# Patient Record
Sex: Male | Born: 1950 | Race: White | Hispanic: No | Smoking: Former smoker
Health system: Southern US, Community
[De-identification: ages and names within clinical notes are randomized; demographics above are authoritative.]

## PROBLEM LIST (undated history)

## (undated) HISTORY — PX: OTHER SURGICAL HISTORY: SHX169

---

## 2004-08-12 ENCOUNTER — Ambulatory Visit: Payer: Self-pay | Admitting: Family Medicine

## 2006-05-05 ENCOUNTER — Ambulatory Visit: Payer: Self-pay | Admitting: Chiropractic Medicine

## 2007-09-21 ENCOUNTER — Ambulatory Visit: Payer: Self-pay | Admitting: Chiropractic Medicine

## 2009-06-11 ENCOUNTER — Ambulatory Visit: Payer: Self-pay | Admitting: Gastroenterology

## 2010-03-29 ENCOUNTER — Emergency Department: Payer: Self-pay | Admitting: Emergency Medicine

## 2010-08-05 ENCOUNTER — Emergency Department: Payer: Self-pay | Admitting: Emergency Medicine

## 2014-03-20 ENCOUNTER — Ambulatory Visit: Payer: Self-pay | Admitting: Gastroenterology

## 2016-02-04 ENCOUNTER — Other Ambulatory Visit: Payer: Self-pay | Admitting: Internal Medicine

## 2016-02-04 DIAGNOSIS — R296 Repeated falls: Secondary | ICD-10-CM

## 2016-02-11 ENCOUNTER — Ambulatory Visit
Admission: RE | Admit: 2016-02-11 | Discharge: 2016-02-11 | Disposition: A | Discharge status: Home / Self Care | Payer: Medicare Other | Source: Ambulatory Visit | Attending: Internal Medicine | Admitting: Internal Medicine

## 2016-02-11 DIAGNOSIS — I6523 Occlusion and stenosis of bilateral carotid arteries: Secondary | ICD-10-CM | POA: Insufficient documentation

## 2016-02-11 DIAGNOSIS — Z9181 History of falling: Secondary | ICD-10-CM | POA: Diagnosis present

## 2016-02-11 DIAGNOSIS — R296 Repeated falls: Secondary | ICD-10-CM

## 2016-02-17 ENCOUNTER — Other Ambulatory Visit: Payer: Self-pay | Admitting: Internal Medicine

## 2016-02-17 DIAGNOSIS — R55 Syncope and collapse: Secondary | ICD-10-CM

## 2016-02-19 ENCOUNTER — Ambulatory Visit
Admission: RE | Admit: 2016-02-19 | Discharge: 2016-02-19 | Disposition: A | Discharge status: Home / Self Care | Payer: Medicare Other | Source: Ambulatory Visit | Attending: Internal Medicine | Admitting: Internal Medicine

## 2016-02-19 DIAGNOSIS — R55 Syncope and collapse: Secondary | ICD-10-CM | POA: Diagnosis not present

## 2016-02-19 MED ORDER — IOPAMIDOL (ISOVUE-370) INJECTION 76%
75.0000 mL | Freq: Once | INTRAVENOUS | Status: AC | PRN
Start: 2016-02-19 — End: 2016-02-19
  Administered 2016-02-19: 75 mL via INTRAVENOUS

## 2016-03-01 ENCOUNTER — Other Ambulatory Visit: Payer: Self-pay | Admitting: Family Medicine

## 2016-03-08 DIAGNOSIS — W19XXXA Unspecified fall, initial encounter: Secondary | ICD-10-CM | POA: Insufficient documentation

## 2016-03-08 DIAGNOSIS — M21372 Foot drop, left foot: Secondary | ICD-10-CM | POA: Insufficient documentation

## 2016-04-19 ENCOUNTER — Ambulatory Visit: Payer: Medicare Other | Admitting: Physical Therapy

## 2016-04-25 ENCOUNTER — Encounter: Payer: Medicare Other | Admitting: Physical Therapy

## 2016-04-28 ENCOUNTER — Ambulatory Visit: Payer: Medicare Other | Attending: Neurology | Admitting: Physical Therapy

## 2016-04-28 ENCOUNTER — Encounter: Payer: Self-pay | Admitting: Physical Therapy

## 2016-04-28 DIAGNOSIS — M6281 Muscle weakness (generalized): Secondary | ICD-10-CM

## 2016-04-28 DIAGNOSIS — R2681 Unsteadiness on feet: Secondary | ICD-10-CM | POA: Diagnosis present

## 2016-04-28 NOTE — Therapy (Signed)
[place] [facility] MAIN [facility] [address] [place], [place], [address] Phone: [phone]   Fax:  [phone]  Physical Therapy Evaluation  Patient Details  Name: Joseph Owen MRN: [id number] Date of Birth: Jan 31, 1951 Referring Provider: Morene CrockerPOTTER, ZACHARY E  Encounter Date: 04/28/2016    No past medical history on file.  No past surgical history on file.  There were no vitals filed for this visit.       Subjective Assessment - 04/28/16 1109    Subjective Patient is having unsteady gait and falls. He also has been sick with a virus for 2-3 weeks and also is having new onset of left hip pain and shooting pain to left knee.    Pertinent History lumbar back surgery x 2, herniated disk cervical spine, right shouler pain/rotator cuff tear/ 3 torn ligaments, left rotator cuff surgery   How long can you stand comfortably? 10  mintues   How long can you walk comfortably? 15 minutes   Diagnostic tests MRI to back 2 years ago, MRI to right shoulder   Patient Stated Goals to walk without pain and become more active   Currently in Pain? Yes   Pain Score 3    Pain Location Hip   Pain Orientation Left   Pain Descriptors / Indicators Aching   Pain Type Acute pain   Pain Onset In the past 7 days   Multiple Pain Sites Yes  knee pain            [facility] PT Assessment - 04/28/16 0001    Assessment   Medical Diagnosis frequent falls   Referring Provider Theora MasterOTTER, ZACHARY E   Onset Date/Surgical Date 04/18/16   Hand Dominance Right   Prior Therapy --  no   Precautions   Precautions Fall   Restrictions   Weight Bearing Restrictions No   Balance Screen   Has the patient fallen in the past 6 months Yes   How many times? 7   Has the patient had a decrease in activity level because of a fear of falling?  Yes   Is the patient reluctant to leave their home because of a fear of falling?  No   Home Nurse, mental healthnvironment   Living Environment Private residence    Living Arrangements Alone   Available Help at Discharge Friend(s)   Type of Home House   Home Access Level entry   Home Layout One level   Home Equipment [place] - single point   Prior Function   Level of Independence Independent   Vocation Retired   Leisure travel , [place], Geophysical data processorhike   Cognition   Overall Cognitive Status Within Functional Limits for tasks assessed   Attention Focused         PAIN: Left hip pain and left knee pain 5/10 that is intermittent  POSTURE: WNL  PROM/AROM: left hip flex 0-90 deg and pain greater than 90 deg Left hip IR 0 with pain  STRENGTH:  Graded on a 0-5 scale Muscle Group Left Right  Shoulder flex    Shoulder Abd    Shoulder Ext    Shoulder IR/ER    Elbow    Wrist/hand    Hip Flex 4/5 5/5  Hip Abd 5/5 5/5  Hip Add 5/5 5/5  Hip Ext    Hip IR/ER 3/5 5/5  Knee Flex 5/5 5/5  Knee Ext 5/5 5/5  Ankle DF 3/5 5/5  Ankle PF 4/5 5/5   SENSATION: decreased sensation L foot  light touch    SPECIAL TESTS: + scour LLE  + FABER  LLE   FUNCTIONAL MOBILITY: independent   BALANCE:  Patient is able to single leg stand with supervision and tandem stand with supervision   GAIT:  Patient ambulates without AD and LLE in ER with decreased LLE foot clearance during swing phase.   OUTCOME MEASURES: TEST Outcome Interpretation  5 times sit<>stand 13. 57sec >60 yo, >15 sec indicates increased risk for falls  10 meter walk test  . 94                m/s <1.0 m/s indicates increased risk for falls; limited community ambulator  Timed up and Go        9.89         sec <14 sec indicates increased risk for falls  6 minute walk test    900            Feet 1000 feet is community ambulator                                     PT Long Term Goals - 04/28/16 1210    PT LONG TERM GOAL #1   Title Patient will be independent in home exercise program to improve strength/mobility for better functional independence with ADLs.   Time 8   Period  Weeks   Status New   PT LONG TERM GOAL #2   Title Patient (< 74 years old) will complete five times sit to stand test in < 10 seconds indicating an increased LE strength and improved balance.   Time 8   Period Weeks   Status New   PT LONG TERM GOAL #3   Title Patient will increase six minute walk test distance to >1000 for progression to community ambulator and improve gait ability   Time 8   Period Weeks   Status New   PT LONG TERM GOAL #4   Title  Patient will increase 10 meter walk test to >1.49m/s as to improve gait speed for better community ambulation and to reduce fall risk   Time 8   Period Weeks   Status New   PT LONG TERM GOAL #5   Title Patient will report a worst pain of 3/10 on VAS in   left hip          to improve tolerance with ADLs and reduced symptoms with activities   Time 8   Period Weeks   Status New               Plan - 04/28/16 1229    Clinical Impression Statement Patient has recent multiple falls and onset of L hip and L knee pain that is limiting his left standing and walking tolerance. Patient has weakness in LLE hip and foot and unsteady gait.    Rehab Potential Good   PT Frequency 2x / week   PT Duration 8 weeks   PT Treatment/Interventions Gait training;Therapeutic activities;Therapeutic exercise;Balance training;Manual techniques   PT Next Visit Plan manual therapy left hip   Consulted and Agree with Plan of Care Patient      Patient will benefit from skilled therapeutic intervention in order to improve the following deficits and impairments:  Difficulty walking, Decreased balance, Decreased strength, Pain, Impaired sensation  Visit Diagnosis: Unsteadiness on feet - Plan: PT plan of care cert/re-cert  Muscle weakness (generalized) - Plan:  PT plan of care cert/re-cert      G-Codes - 04/28/16 1209    Functional Assessment Tool Used 5 x sit to stand, 10 MW, TUG, 6 MW   Functional Limitation Mobility: Walking and moving around    Mobility: Walking and Moving Around Current Status [phone]([id number]) At least 20 percent but less than 40 percent impaired, limited or restricted   Mobility: Walking and Moving Around Goal Status [phone]([id number]) At least 1 percent but less than 20 percent impaired, limited or restricted       Problem List There are no active problems to display for this patient. Ezekiel InaKristine S [place], PT, DPT  [place], Bark[place] BrunsKristine S 04/28/2016, 12:32 PM  [place] [facility] MAIN [facility] [address] [place], [place], [address] Phone: [phone]   Fax:  [phone]  Name: Joseph Owen MRN: [id number] Date of Birth: [birth year]

## 2016-05-03 ENCOUNTER — Ambulatory Visit: Payer: Medicare Other | Admitting: Physical Therapy

## 2016-05-03 DIAGNOSIS — M6281 Muscle weakness (generalized): Secondary | ICD-10-CM

## 2016-05-03 DIAGNOSIS — R2681 Unsteadiness on feet: Secondary | ICD-10-CM | POA: Diagnosis not present

## 2016-05-03 NOTE — Therapy (Signed)
[place] [facility] MAIN [facility] SERVICES [address] [place], [place], [address] Phone: [phone]   Fax:  [phone]  Physical Therapy Treatment  Patient Details  Name: Joseph Owen MRN: [id number] Date of Birth: [birth year] Referring Provider: Morene Crocker  Encounter Date: 05/03/2016    No past medical history on file.  No past surgical history on file.  There were no vitals filed for this visit.      Subjective Assessment - 05/03/16 1521    Subjective Patient is having B knee pain and back pain. Nothing is painful today.    Pertinent History lumbar back surgery x 2, herniated disk cervical spine, right shouler pain/rotator cuff tear/ 3 torn ligaments, left rotator cuff surgery   How long can you stand comfortably? 10  mintues   How long can you walk comfortably? 15 minutes   Diagnostic tests MRI to back 2 years ago, MRI to right shoulder   Patient Stated Goals to walk without pain and become more active   Pain Onset In the past 7 days      Therapeutic exercise: standing hip abd/ flex/ ext  with YTB x 20  Side stepping on blue balance beam foam x 10 x 3 Stepping over 1/2 foam left and right x 10  step ups from floor to 6 inch stool x 20 bilateral sit to stand x 10 marching in parallel bars x 20  tapping to 6 inch stool x 20 TM x . 7 miles / hour x 7 minutes TM side stepping left and right x 2 minutes each side x . 4 miles/hour  Min cueing needed to appropriately perform  tasks with leg, hand, and head position. Decreased coordination demonstrated requiring consistent verbal cueing to correct form. Patient continues to demonstrate some in coordination of movement with select exercises such as stepping backwards. Patient responds well to verbal and tactile cues to correct form and technique.  CGA to SBA for safety with activities.                             PT Education - 05/03/16 1521    Education provided  Yes   Education Details HEP   Person(Owen) Educated Patient   Methods Explanation   Comprehension Verbalized understanding             PT Long Term Goals - 04/28/16 1210    PT LONG TERM GOAL #1   Title Patient will be independent in home exercise program to improve strength/mobility for better functional independence with ADLs.   Time 8   Period Weeks   Status New   PT LONG TERM GOAL #2   Title Patient (< 76 years old) will complete five times sit to stand test in < 10 seconds indicating an increased LE strength and improved balance.   Time 8   Period Weeks   Status New   PT LONG TERM GOAL #3   Title Patient will increase six minute walk test distance to >1000 for progression to community ambulator and improve gait ability   Time 8   Period Weeks   Status New   PT LONG TERM GOAL #4   Title  Patient will increase 10 meter walk test to >1.40m/Owen as to improve gait speed for better community ambulation and to reduce fall risk   Time 8   Period Weeks   Status New   PT LONG TERM GOAL #5  Title Patient will report a worst pain of 3/10 on VAS in   left hip          to improve tolerance with ADLs and reduced symptoms with activities   Time 8   Period Weeks   Status New               Plan - 05/03/16 1522    Clinical Impression Statement Patient is not having any pain complaints today and is able to perform beginning LE exercises to reach goals to improve gait.    Rehab Potential Good   PT Frequency 2x / week   PT Duration 8 weeks   PT Treatment/Interventions Gait training;Therapeutic activities;Therapeutic exercise;Balance training;Manual techniques   PT Next Visit Plan manual therapy left hip   Consulted and Agree with Plan of Care Patient      Patient will benefit from skilled therapeutic intervention in order to improve the following deficits and impairments:  Difficulty walking, Decreased balance, Decreased strength, Pain, Impaired sensation  Visit  Diagnosis: Unsteadiness on feet  Muscle weakness (generalized)     Problem List There are no active problems to display for this patient.  Joseph Owen, PT, DPT [place], [place] Owen 05/03/2016, 3:50 PM  [place] [facility] [address] [place], [place], [address] Phone: [phone]   Fax:  [phone]  Name: Joseph Owen MRN: [id number] Date of Birth: Feb 05, 1951

## 2016-05-04 ENCOUNTER — Encounter: Payer: Medicare Other | Admitting: Physical Therapy

## 2016-05-05 ENCOUNTER — Ambulatory Visit: Payer: Medicare Other | Admitting: Physical Therapy

## 2016-05-05 ENCOUNTER — Encounter: Payer: Self-pay | Admitting: Physical Therapy

## 2016-05-05 DIAGNOSIS — R2681 Unsteadiness on feet: Secondary | ICD-10-CM | POA: Diagnosis not present

## 2016-05-05 DIAGNOSIS — M6281 Muscle weakness (generalized): Secondary | ICD-10-CM

## 2016-05-05 NOTE — Therapy (Signed)
[facility] MAIN [facility] SERVICES [address] [place], [place], [address] Phone: [phone]   Fax:  [phone]  Physical Therapy Treatment  Patient Details  Name: Joseph Owen MRN: [id number] Date of Birth: 01/25/1951 Referring Provider: Morene Crocker  Encounter Date: 05/05/2016    History reviewed. No pertinent past medical history.  History reviewed. No pertinent past surgical history.  There were no vitals filed for this visit.      Subjective Assessment - 05/05/16 1547    Subjective Patient is having B knee pain and back pain. Nothing is painful today.    Pertinent History lumbar back surgery x 2, herniated disk cervical spine, right shouler pain/rotator cuff tear/ 3 torn ligaments, left rotator cuff surgery   How long can you stand comfortably? 10  mintues   How long can you walk comfortably? 15 minutes   Diagnostic tests MRI to back 2 years ago, MRI to right shoulder   Patient Stated Goals to walk without pain and become more active   Currently in Pain? Yes   Pain Score 3    Pain Location Hip   Pain Orientation Left   Pain Descriptors / Indicators Aching   Pain Onset In the past 7 days     TM x 1. 0 m/ hour x 5 mins,  side stepping left and right . 4 m/ hour B LE standing hip SLR, abd, ext 2x10 with GTB Side stepping with YTB x 10 feet x 5 Mini squats x 10 x 2  Heel raises 2x10 Leg press 75 lbs x 20 x 2  Cues for proper technique of exercises, slow eccentric contractions to target specific muscles and facility increased muscle building. Therapeutic rest breaks for energy conservation                           PT Education - 05/05/16 1547    Education provided Yes   Education Details HEP   Person(s) Educated Patient   Methods Explanation   Comprehension Verbalized understanding             PT Long Term Goals - 04/28/16 1210    PT LONG TERM GOAL #1   Title Patient will be  independent in home exercise program to improve strength/mobility for better functional independence with ADLs.   Time 8   Period Weeks   Status New   PT LONG TERM GOAL #2   Title Patient (< 58 years old) will complete five times sit to stand test in < 10 seconds indicating an increased LE strength and improved balance.   Time 8   Period Weeks   Status New   PT LONG TERM GOAL #3   Title Patient will increase six minute walk test distance to >1000 for progression to community ambulator and improve gait ability   Time 8   Period Weeks   Status New   PT LONG TERM GOAL #4   Title  Patient will increase 10 meter walk test to >1.41m/s as to improve gait speed for better community ambulation and to reduce fall risk   Time 8   Period Weeks   Status New   PT LONG TERM GOAL #5   Title Patient will report a worst pain of 3/10 on VAS in   left hip          to improve tolerance with ADLs and reduced symptoms with activities   Time 8  Period Weeks   Status New               Plan - 05/05/16 1548    Clinical Impression Statement  Min cueing needed during hip 3-way to maintain upright posture with knees straight. Pt had good performance of therapeutic exercise today.   Rehab Potential Good   PT Frequency 2x / week   PT Duration 8 weeks   PT Treatment/Interventions Gait training;Therapeutic activities;Therapeutic exercise;Balance training;Manual techniques   PT Next Visit Plan manual therapy left hip   Consulted and Agree with Plan of Care Patient      Patient will benefit from skilled therapeutic intervention in order to improve the following deficits and impairments:  Difficulty walking, Decreased balance, Decreased strength, Pain, Impaired sensation  Visit Diagnosis: Unsteadiness on feet  Muscle weakness (generalized)     Problem List There are no active problems to display for this patient.  Ezekiel InaKristine S Jaleen Finch, PT, DPT [place], Bark[place] BrunsKristine S 05/05/2016, 3:49 PM  [facility] [address] [place], [place], [address] Phone: [phone]   Fax:  [phone]  Name: Joseph Owen MRN: [id number] Date of Birth: 1951/05/29

## 2016-05-09 ENCOUNTER — Encounter: Payer: Self-pay | Admitting: Physical Therapy

## 2016-05-09 ENCOUNTER — Ambulatory Visit: Payer: Medicare Other | Admitting: Physical Therapy

## 2016-05-09 DIAGNOSIS — R2681 Unsteadiness on feet: Secondary | ICD-10-CM | POA: Diagnosis not present

## 2016-05-09 DIAGNOSIS — M6281 Muscle weakness (generalized): Secondary | ICD-10-CM

## 2016-05-09 NOTE — Therapy (Addendum)
[place] [facility] MA[facility] [address] [place], [place], [address] Phone: [phone]   Fax:  [phone]  Physical Therapy Treatment  Patient Details  Name: Joseph Owen MRN: [id number] Date of Birth: 10-01-1951 Referring Provider: Morene CrockerPOTTER, ZACHARY E  Encounter Date: 05/09/2016    History reviewed. No pertinent past medical history.  History reviewed. No pertinent past surgical history.  There were no vitals filed for this visit.      Subjective Assessment - 05/09/16 1309    Subjective Patient is feeling very tired and lethargic today.    Pertinent History lumbar back surgery x 2, herniated disk cervical spine, right shouler pain/rotator cuff tear/ 3 torn ligaments, left rotator cuff surgery   How long can you stand comfortably? 10  mintues   How long can you walk comfortably? 15 minutes   Diagnostic tests MRI to back 2 years ago, MRI to right shoulder   Patient Stated Goals to walk without pain and become more active   Currently in Pain? Yes   Pain Score 2    Pain Location Knee   Pain Onset In the past 7 days      Therapeutic exercise and neuromuscular training: 1/2 foam flat side up and balance with head turns left and right feet apart and feet together,  tandem standing on 1/2 foam     side stepping left and right in parallel bars 10 feet x 3 Blue foam feet together and tandem standing with head turns  Tm walking 1. 0 m/hour x 5 mins TM walking side stepping left and right x . 4 miles / hour  Patient needs occasional verbal cueing to improve posture and cueing to correctly perform exercises slowly, holding at end of range to increase motor firing of desired muscle to encourage fatigue.                           PT Education - 05/09/16 1310    Education provided Yes   Education Details HEP   Person(s) Educated Patient   Methods Explanation   Comprehension Verbalized understanding              PT Long Term Goals - 04/28/16 1210    PT LONG TERM GOAL #1   Title Patient will be independent in home exercise program to improve strength/mobility for better functional independence with ADLs.   Time 8   Period Weeks   Status New   PT LONG TERM GOAL #2   Title Patient (< 66 years old) will complete five times sit to stand test in < 10 seconds indicating an increased LE strength and improved balance.   Time 8   Period Weeks   Status New   PT LONG TERM GOAL #3   Title Patient will increase six minute walk test distance to >1000 for progression to community ambulator and improve gait ability   Time 8   Period Weeks   Status New   PT LONG TERM GOAL #4   Title  Patient will increase 10 meter walk test to >1.2463m/s as to improve gait speed for better community ambulation and to reduce fall risk   Time 8   Period Weeks   Status New   PT LONG TERM GOAL #5   Title Patient will report a worst pain of 3/10 on VAS in   left hip          to improve tolerance with  ADLs and reduced symptoms with activities   Time 8   Period Weeks   Status New               Plan - 05/09/16 1310    Clinical Impression Statement Pt requires mod verbal and tactile cues for proper exercise performance     Rehab Potential Good   PT Frequency 2x / week   PT Duration 8 weeks   PT Treatment/Interventions Gait training;Therapeutic activities;Therapeutic exercise;Balance training;Manual techniques   PT Next Visit Plan manual therapy left hip   Consulted and Agree with Plan of Care Patient      Patient will benefit from skilled therapeutic intervention in order to improve the following deficits and impairments:  Difficulty walking, Decreased balance, Decreased strength, Pain, Impaired sensation  Visit Diagnosis: Unsteadiness on feet  Muscle weakness (generalized)     Problem List There are no active problems to display for this patient.  Ezekiel Ina, PT, DPT [place], [place]  S 05/09/2016, 1:12 PM  [place] [facility] MAIN [facility] SERVICES [address] [place], [place], [address] Phone: [phone]   Fax:  [phone]  Name: Joseph Owen MRN: [id number] Date of Birth: 06/24/1951

## 2016-05-11 ENCOUNTER — Encounter: Payer: Self-pay | Admitting: Physical Therapy

## 2016-05-11 ENCOUNTER — Ambulatory Visit: Payer: Medicare Other | Admitting: Physical Therapy

## 2016-05-11 DIAGNOSIS — M6281 Muscle weakness (generalized): Secondary | ICD-10-CM

## 2016-05-11 DIAGNOSIS — R2681 Unsteadiness on feet: Secondary | ICD-10-CM

## 2016-05-11 NOTE — Therapy (Signed)
[facility] MAIN [facility] [address] [place], [place], [address] Phone: [phone]   Fax:  [phone]  Physical Therapy Treatment  Patient Details  Name: Deforest HoylesRobert E Brocks MRN: [id number] Date of Birth: 1951-07-12 Referring Provider: Morene CrockerPOTTER, ZACHARY E  Encounter Date: 05/11/2016    History reviewed. No pertinent past medical history.  History reviewed. No pertinent past surgical history.  There were no vitals filed for this visit.      Subjective Assessment - 05/11/16 1304    Subjective Patient is feeling better over all . His knees are hurting more than his hip was to begin with. Its getting easier to get off the couch.    Pertinent History lumbar back surgery x 2, herniated disk cervical spine, right shouler pain/rotator cuff tear/ 3 torn ligaments, left rotator cuff surgery   How long can you stand comfortably? 10  mintues   How long can you walk comfortably? 15 minutes   Diagnostic tests MRI to back 2 years ago, MRI to right shoulder   Patient Stated Goals to walk without pain and become more active   Currently in Pain? Yes   Pain Score 2    Pain Location Knee   Pain Orientation Right;Left   Pain Descriptors / Indicators Aching   Pain Onset In the past 7 days      Neuromuscular Re-education   Marching in place on blue foam pad x 30 seconds for 2 sets   Tandem standing in // bars  x 1 minute Step ups to blue foam pad x 10 bilaterally for 2 sets bilaterally   Heel raises bilateral feet  Matrix  x 5 reps fwd/bwd/side stepping Feet together on blue foam and holding ball x 1 minute, holding ball and fwd and bwd movement elbow flex/ext x 20, horizontal abd/add with ball and arms extended. Toe taps on AIREX + step 3x10 no Ue Fwd step up onto 6inch step from AIREX no UE Fwd step up onto 4inch step no UE x 10 each side CGA to min A needed cues for wt Patient needs occasional verbal cueing to improve posture and cueing to  correctly perform exercises slowly, holding at end of range to increase motor firing of desired muscle to encourage fatigue.                              PT Education - 05/11/16 1305    Education provided Yes   Education Details HEP   Person(s) Educated Patient   Methods Explanation   Comprehension Verbalized understanding             PT Long Term Goals - 04/28/16 1210    PT LONG TERM GOAL #1   Title Patient will be independent in home exercise program to improve strength/mobility for better functional independence with ADLs.   Time 8   Period Weeks   Status New   PT LONG TERM GOAL #2   Title Patient (< 66 years old) will complete five times sit to stand test in < 10 seconds indicating an increased LE strength and improved balance.   Time 8   Period Weeks   Status New   PT LONG TERM GOAL #3   Title Patient will increase six minute walk test distance to >1000 for progression to community ambulator and improve gait ability   Time 8   Period Weeks   Status New   PT LONG TERM  GOAL #4   Title  Patient will increase 10 meter walk test to >1.67m/s as to improve gait speed for better community ambulation and to reduce fall risk   Time 8   Period Weeks   Status New   PT LONG TERM GOAL #5   Title Patient will report a worst pain of 3/10 on VAS in   left hip          to improve tolerance with ADLs and reduced symptoms with activities   Time 8   Period Weeks   Status New               Plan - 05/11/16 1306    Clinical Impression Statement Patient has LE weakness and his hip pain is improving. He is able to perform beginning strengthening exercises for strength and balance.    Rehab Potential Good   PT Frequency 2x / week   PT Duration 8 weeks   PT Treatment/Interventions Gait training;Therapeutic activities;Therapeutic exercise;Balance training;Manual techniques   PT Next Visit Plan manual therapy left hip   Consulted and Agree with Plan of Care  Patient      Patient will benefit from skilled therapeutic intervention in order to improve the following deficits and impairments:  Difficulty walking, Decreased balance, Decreased strength, Pain, Impaired sensation  Visit Diagnosis: Unsteadiness on feet  Muscle weakness (generalized)     Problem List There are no active problems to display for this patient.  Ezekiel Ina, PT, DPT [place], [place] 05/11/2016, 1:09 PM  [facility] MAIN [facility] SERVICES [address] [place], [place], [address] Phone: [phone]   Fax:  [phone]  Name: ZAYYAN MULLEN MRN: [id number] Date of Birth: [birth year]

## 2016-05-11 NOTE — Patient Instructions (Signed)
Balance, Proprioception: Hip Abduction With Tubing   With tubing attached to both ankles, Standing holding onto counter, kick one leg out to side and then Return.  Repeat _10___ times  On each side.  Do ___2_ sessions per day.  http://cc.exer.us/20   Copyright  VHI. All rights reserved.  Balance, Proprioception: Hip Adduction With Tubing   With tubing tied around table leg, loop band around one ankle and try to cross leg in front. Return. Repeat _10___ times. do __2__ sessions per day.  http://cc.exer.us/21   Copyright  VHI. All rights reserved.  Balance, Proprioception: Hip Extension With Tubing   With tubing tied around both legs, holding onto kitchen counter, swing leg back. Return. Repeat _10___ times . Do __2__ sessions per day.  http://cc.exer.us/19   Copyright  VHI. All rights reserved.  Balance, Proprioception: Hip Flexion With Tubing   With tubing attached to both ankles, swing leg forward. Return. Repeat _10___ times. Do __2__ sessions per day.  http://cc.exer.us/18   Copyright  VHI. All rights reserved.  Band Walk: Side Stepping   Tie band around legs, just above knees. Step _10__ feet to one side, then step back to start. Repeat _2-3__ feet per session. Note: Small towel between band and skin eases rubbing.  http://plyo.exer.us/76   Copyright  VHI. All rights reserved.   Band Walk: Zig Zag   Tie green band around legs, just above knees. Walk forward _both__ feet in a zig zag pattern. Without turning walk backward to start for one zig zag. Repeat _2-3__ zig zags per session.   http://plyo.exer.us/80   Copyright  VHI. All rights reserved.

## 2016-05-16 ENCOUNTER — Ambulatory Visit: Payer: Medicare Other | Admitting: Physical Therapy

## 2016-05-16 ENCOUNTER — Encounter: Payer: Self-pay | Admitting: Physical Therapy

## 2016-05-16 DIAGNOSIS — R2681 Unsteadiness on feet: Secondary | ICD-10-CM

## 2016-05-16 DIAGNOSIS — M6281 Muscle weakness (generalized): Secondary | ICD-10-CM

## 2016-05-16 NOTE — Therapy (Signed)
[facility] MAIN [facility] SERVICES [address] [place], [place], [address] Phone: [phone]   Fax:  [phone]  Physical Therapy Treatment  Patient Details  Name: Joseph Owen MRN: [id number] Date of Birth: 09-23-1951 Referring Provider: Morene Crocker  Encounter Date: 05/16/2016      PT End of Session - 05/16/16 1329    Visit Number 6   Number of Visits 17   Date for PT Re-Evaluation 06/23/16   PT Start Time 0100   PT Stop Time 0140   PT Time Calculation (min) 40 min   Equipment Utilized During Treatment Gait belt   Activity Tolerance Patient tolerated treatment well;Patient limited by pain;Patient limited by fatigue   Behavior During Therapy [facility] for tasks assessed/performed      History reviewed. No pertinent past medical history.  History reviewed. No pertinent surgical history.  There were no vitals filed for this visit.      Subjective Assessment - 05/16/16 1322    Subjective Patient had a fall on saturday and now has a fear of falling.  He reports improvement in his hips and knees from therapy.    Pertinent History lumbar back surgery x 2, herniated disk cervical spine, right shouler pain/rotator cuff tear/ 3 torn ligaments, left rotator cuff surgery   How long can you stand comfortably? 10  mintues   How long can you walk comfortably? 15 minutes   Diagnostic tests MRI to back 2 years ago, MRI to right shoulder   Patient Stated Goals to walk without pain and become more active   Currently in Pain? Yes   Pain Score 2    Pain Location Knee   Pain Orientation Right;Left   Pain Onset In the past 7 days       Therapeutic exercise:  standing hip abd with YTB x 20  side stepping left and right in parallel bars 10 feet x 3 standing on blue foam tapping stool Leg press 90 lbs x 20 x 3  Neuromuscular training:   Modified tandem standing in parallel bars on blue foam with head turns with CGA Feet together on blue  foam with head turns and CGA Side stepping on blue balance foam with CGA Reviewed HEP  Patient needs occasional verbal cueing to improve posture and cueing to correctly perform exercises slowly and CGA for balance in standing.                             PT Education - 05/16/16 1323    Education provided Yes   Education Details HEP   Person(s) Educated Patient   Methods Explanation   Comprehension Verbalized understanding             PT Long Term Goals - 04/28/16 1210      PT LONG TERM GOAL #1   Title Patient will be independent in home exercise program to improve strength/mobility for better functional independence with ADLs.   Time 8   Period Weeks   Status New     PT LONG TERM GOAL #2   Title Patient (< 60 years old) will complete five times sit to stand test in < 10 seconds indicating an increased LE strength and improved balance.   Time 8   Period Weeks   Status New     PT LONG TERM GOAL #3   Title Patient will increase six minute walk test distance to >1000 for progression  to community ambulator and improve gait ability   Time 8   Period Weeks   Status New     PT LONG TERM GOAL #4   Title  Patient will increase 10 meter walk test to >1.37m/s as to improve gait speed for better community ambulation and to reduce fall risk   Time 8   Period Weeks   Status New     PT LONG TERM GOAL #5   Title Patient will report a worst pain of 3/10 on VAS in   left hip          to improve tolerance with ADLs and reduced symptoms with activities   Time 8   Period Weeks   Status New               Plan - 05/16/16 1326    Clinical Impression Statement Verbal cues needed to perform exercises in full ROM. Pain in knees limited strengthening exercises today. VC to eccentrically control leg press and avoid knee hyperextension.   Rehab Potential Good   PT Frequency 2x / week   PT Duration 8 weeks   PT Treatment/Interventions Gait training;Therapeutic  activities;Therapeutic exercise;Balance training;Manual techniques   PT Next Visit Plan manual therapy left hip   Consulted and Agree with Plan of Care Patient      Patient will benefit from skilled therapeutic intervention in order to improve the following deficits and impairments:  Difficulty walking, Decreased balance, Decreased strength, Pain, Impaired sensation  Visit Diagnosis: Unsteadiness on feet  Muscle weakness (generalized)     Problem List There are no active problems to display for this patient. Ezekiel Ina, PT, DPT  [place], [place] 05/16/2016, 1:37 PM  [facility] MAIN [facility] SERVICES [address] [place], [place], [address] Phone: [phone]   Fax:  [phone]  Name: KROSBY VAIL MRN: [id number] Date of Birth: 28-Apr-1951

## 2016-05-18 ENCOUNTER — Ambulatory Visit: Payer: Medicare Other | Admitting: Physical Therapy

## 2016-05-23 ENCOUNTER — Ambulatory Visit: Payer: Medicare Other | Admitting: Physical Therapy

## 2016-05-23 ENCOUNTER — Encounter: Payer: Medicare Other | Admitting: Physical Therapy

## 2016-05-23 ENCOUNTER — Encounter: Payer: Self-pay | Admitting: Physical Therapy

## 2016-05-23 DIAGNOSIS — M6281 Muscle weakness (generalized): Secondary | ICD-10-CM

## 2016-05-23 DIAGNOSIS — R2681 Unsteadiness on feet: Secondary | ICD-10-CM | POA: Diagnosis not present

## 2016-05-23 NOTE — Therapy (Signed)
[place] [facility] MAIN [facility] SERVICES [address] [place], [place], [address] Phone: [phone]   Fax:  [phone]  Physical Therapy Treatment  Patient Details  Name: Joseph Owen MRN: [id number] Date of Birth: September 07, 1951 Referring Provider: Morene Crocker  Encounter Date: 05/23/2016      PT End of Session - 05/23/16 1548    Visit Number 7   Number of Visits 17   Date for PT Re-Evaluation 06/23/16   PT Start Time 1515   PT Stop Time 1557   PT Time Calculation (min) 42 min   Equipment Utilized During Treatment Gait belt   Activity Tolerance Patient tolerated treatment well;Patient limited by pain;Patient limited by fatigue   Behavior During Therapy [facility] for tasks assessed/performed      History reviewed. No pertinent past medical history.  History reviewed. No pertinent surgical history.  There were no vitals filed for this visit.      Subjective Assessment - 05/23/16 1546    Subjective Pt reports he fell again on Monday after getting back from therapy. He is bruised on his R shoulder and rib cage and reports he broke a couple of ribs.   Pertinent History lumbar back surgery x 2, herniated disk cervical spine, right shouler pain/rotator cuff tear/ 3 torn ligaments, left rotator cuff surgery   How long can you stand comfortably? 10  mintues   How long can you walk comfortably? 15 minutes   Diagnostic tests MRI to back 2 years ago, MRI to right shoulder   Patient Stated Goals to walk without pain and become more active   Currently in Pain? Yes   Pain Score 4   R ribs   Pain Type Acute pain   Pain Onset In the past 7 days                                 PT Education - 05/23/16 1548    Education provided Yes   Education Details safety to reduce risk of falls   Person(s) Educated Patient   Methods Explanation   Comprehension Verbalized understanding             PT Long Term Goals - 04/28/16  1210      PT LONG TERM GOAL #1   Title Patient will be independent in home exercise program to improve strength/mobility for better functional independence with ADLs.   Time 8   Period Weeks   Status New     PT LONG TERM GOAL #2   Title Patient (< 22 years old) will complete five times sit to stand test in < 10 seconds indicating an increased LE strength and improved balance.   Time 8   Period Weeks   Status New     PT LONG TERM GOAL #3   Title Patient will increase six minute walk test distance to >1000 for progression to community ambulator and improve gait ability   Time 8   Period Weeks   Status New     PT LONG TERM GOAL #4   Title  Patient will increase 10 meter walk test to >1.59m/s as to improve gait speed for better community ambulation and to reduce fall risk   Time 8   Period Weeks   Status New     PT LONG TERM GOAL #5   Title Patient will report a worst pain of 3/10 on VAS in  left hip          to improve tolerance with ADLs and reduced symptoms with activities   Time 8   Period Weeks   Status New      Therapeutic exercise:   Nustep L3 x2 min, L5 x 6 min, L2 x Cues throughout for increased SPM and pushing through heels for LE strengthening and endurance.   Standing SLR, ext, abd, marching with RTB 2x10 each STS without UE support x15 Heel raises 2x20 Leg press 120 # x 20 x 3 Wall slides with 5 sec hold 2x10  Mod cues for proper technique of exercises to target specific muscles and slow eccentric contractions for most effective strengthening. Therapeutic rest breaks for energy conservation.          Plan - 05/23/16 1550    Clinical Impression Statement Pt able to tolerate LE strength and endurance training this session.  Monitored rib pain throughout. Pt became fatigued requiring seated therapeutic rest breaks for energy conservation. He will benefit from continued strength and balance training to reduce risk of falls.   Rehab Potential Good   PT  Frequency 2x / week   PT Duration 8 weeks   PT Treatment/Interventions Gait training;Therapeutic activities;Therapeutic exercise;Balance training;Manual techniques   PT Next Visit Plan progress strength and balance    Consulted and Agree with Plan of Care Patient      Patient will benefit from skilled therapeutic intervention in order to improve the following deficits and impairments:  Difficulty walking, Decreased balance, Decreased strength, Pain, Impaired sensation  Visit Diagnosis: Unsteadiness on feet  Muscle weakness (generalized)     Problem List There are no active problems to display for this patient.   Adelene Idler, PT, DPT  05/23/16, 4:00 PM [phone]  [facility] MAIN [facility] SERVICES [address] [place], [place], [address] Phone: [phone]   Fax:  [phone]  Name: Joseph Owen MRN: [id number] Date of Birth: 1951/01/25

## 2016-05-25 ENCOUNTER — Encounter: Payer: Self-pay | Admitting: Physical Therapy

## 2016-05-25 ENCOUNTER — Ambulatory Visit: Payer: Medicare Other | Attending: Neurology | Admitting: Physical Therapy

## 2016-05-25 DIAGNOSIS — R2681 Unsteadiness on feet: Secondary | ICD-10-CM | POA: Diagnosis present

## 2016-05-25 DIAGNOSIS — M6281 Muscle weakness (generalized): Secondary | ICD-10-CM

## 2016-05-25 NOTE — Therapy (Signed)
[place] [facility] MAIN [facility] SERVICES [address] [place], [place], [address] Phone: [phone]   Fax:  [phone]  Physical Therapy Treatment  Patient Details  Name: Joseph Owen MRN: [id number] Date of Birth: [birth year] Referring Provider: Morene Crocker  Encounter Date: 05/25/2016      PT End of Session - 05/25/16 1518    Visit Number 8   Number of Visits 17   Date for PT Re-Evaluation 06/23/16   PT Start Time 1515   PT Stop Time 1555   PT Time Calculation (min) 40 min   Equipment Utilized During Treatment Gait belt   Activity Tolerance Patient tolerated treatment well;Patient limited by pain;Patient limited by fatigue   Behavior During Therapy [facility] for tasks assessed/performed      History reviewed. No pertinent past medical history.  History reviewed. No pertinent surgical history.  There were no vitals filed for this visit.      Subjective Assessment - 05/25/16 1514    Subjective Pt reports his ribs are slowly feeling better. Mild LE soreness after last session.   Currently in Pain? Yes   Pain Score 3   R ribs   Pain Type Acute pain   Pain Onset In the past 7 days       Therapeutic exercise:   Nustep L3 x2 min, L6 x 6 min, L2 x Cues throughout for increased SPM and pushing through heels for LE strengthening and endurance. Cues for decreased SPM last 2 minutes for appropriate cardio cool down.   Standing SLR, ext, abd, marching with RTB 2x10 each STS without UE support x15 Heel raises 2x20 Eccentric step downs 6 inch, 2x10 each Leg press 130 # x 20 x 3    Mod cues for proper technique of exercises to target specific muscles and slow eccentric contractions for most effective strengthening. Therapeutic rest breaks for energy conservation.                           PT Education - 05/25/16 1518    Education provided Yes   Education Details DOMS, hydration   Person(s) Educated Patient   Methods Explanation   Comprehension Verbalized understanding             PT Long Term Goals - 04/28/16 1210      PT LONG TERM GOAL #1   Title Patient will be independent in home exercise program to improve strength/mobility for better functional independence with ADLs.   Time 8   Period Weeks   Status New     PT LONG TERM GOAL #2   Title Patient (< 16 years old) will complete five times sit to stand test in < 10 seconds indicating an increased LE strength and improved balance.   Time 8   Period Weeks   Status New     PT LONG TERM GOAL #3   Title Patient will increase six minute walk test distance to >1000 for progression to community ambulator and improve gait ability   Time 8   Period Weeks   Status New     PT LONG TERM GOAL #4   Title  Patient will increase 10 meter walk test to >1.58m/s as to improve gait speed for better community ambulation and to reduce fall risk   Time 8   Period Weeks   Status New     PT LONG TERM GOAL #5   Title Patient will report  a worst pain of 3/10 on VAS in   left hip          to improve tolerance with ADLs and reduced symptoms with activities   Time 8   Period Weeks   Status New               Plan - 05/25/16 1550    Clinical Impression Statement Pt became fatigued towards the end of exercise on the Nustep as well as during LE strengthening, requiring occasional therapeutic rest breaks for energy conservation. He was able to peroform LE strengthening exercsies with minimal increase in rib pain. He will benefit from continued LE strengthening and endurance training to improve funcitonal mobility and activity tolerance.   Rehab Potential Good   PT Frequency 2x / week   PT Duration 8 weeks   PT Treatment/Interventions Gait training;Therapeutic activities;Therapeutic exercise;Balance training;Manual techniques   PT Next Visit Plan progress strength and balance    Consulted and Agree with Plan of Care Patient      Patient will  benefit from skilled therapeutic intervention in order to improve the following deficits and impairments:  Difficulty walking, Decreased balance, Decreased strength, Pain, Impaired sensation  Visit Diagnosis: Unsteadiness on feet  Muscle weakness (generalized)     Problem List There are no active problems to display for this patient.   Nikolette Reindl Lucillie Garfinkel, PT, DPT  05/25/16, 4:10 PM [phone]  [facility] MAIN [facility] SERVICES [address] [place], [place], [address] Phone: [phone]   Fax:  [phone]  Name: Joseph Owen MRN: [id number] Date of Birth: 1951/09/24

## 2016-05-31 ENCOUNTER — Encounter: Payer: Self-pay | Admitting: Physical Therapy

## 2016-05-31 ENCOUNTER — Ambulatory Visit: Payer: Medicare Other | Admitting: Physical Therapy

## 2016-05-31 DIAGNOSIS — R2681 Unsteadiness on feet: Secondary | ICD-10-CM | POA: Diagnosis not present

## 2016-05-31 DIAGNOSIS — M6281 Muscle weakness (generalized): Secondary | ICD-10-CM

## 2016-05-31 NOTE — Therapy (Signed)
[place] [facility] MAIN [facility] SERVICES [address] [place], [place], [address] Phone: [phone]   Fax:  [phone]  Physical Therapy Treatment  Patient Details  Name: Joseph Owen MRN: [id number] Date of Birth: 1951-08-27 Referring Provider: Morene Crocker  Encounter Date: 05/31/2016      PT End of Session - 05/31/16 1523    Visit Number 8   Number of Visits 17   Date for PT Re-Evaluation 07-01-2016   Authorization Type g codes   Authorization Time Period 8/10   PT Start Time 1515   PT Stop Time 1600   PT Time Calculation (min) 45 min   Equipment Utilized During Treatment Gait belt   Activity Tolerance Patient tolerated treatment well;Patient limited by pain;Patient limited by fatigue   Behavior During Therapy [facility] for tasks assessed/performed      History reviewed. No pertinent past medical history.  History reviewed. No pertinent surgical history.  There were no vitals filed for this visit.      Subjective Assessment - 05/31/16 1521    Subjective Pt reports his ribs are slowly feeling better. Mild LE soreness after last session.   Pertinent History lumbar back surgery x 2, herniated disk cervical spine, right shouler pain/rotator cuff tear/ 3 torn ligaments, left rotator cuff surgery   How long can you stand comfortably? 10  mintues   How long can you walk comfortably? 15 minutes   Diagnostic tests MRI to back 2 years ago, MRI to right shoulder   Patient Stated Goals to walk without pain and become more active   Currently in Pain? Yes   Pain Score 2    Pain Location Rib cage   Pain Orientation --   Pain Descriptors / Indicators Aching   Pain Type Acute pain   Pain Onset In the past 7 days      NEUROMUSCULAR RE-EDUCATION Matrix fwd/bwd, side to side x 5 reps TM walking side stepping . 4 miles / hour 3 minutes each side   Therapeutic exercise; Leg press x 20 x 3 120 lbs, heel raises with 90 lbs x 20 x 3  CGA and Min   verbal cues used throughout with increased in postural sway and LOB most seen with narrow base of support and while on uneven surfaces. Continues to have balance deficits typical with diagnosis. Patient performs high level exercises without increasing pain behaviors and needs verbal cuing for postural alignment and head positioning                             PT Education - 05/31/16 1523    Education provided Yes   Education Details HEP progression   Person(s) Educated Patient   Methods Explanation   Comprehension Verbalized understanding             PT Long Term Goals - 04/28/16 1210      PT LONG TERM GOAL #1   Title Patient will be independent in home exercise program to improve strength/mobility for better functional independence with ADLs.   Time 8   Period Weeks   Status New     PT LONG TERM GOAL #2   Title Patient (< 68 years old) will complete five times sit to stand test in < 10 seconds indicating an increased LE strength and improved balance.   Time 8   Period Weeks   Status New     PT LONG TERM GOAL #  3   Title Patient will increase six minute walk test distance to >1000 for progression to community ambulator and improve gait ability   Time 8   Period Weeks   Status New     PT LONG TERM GOAL #4   Title  Patient will increase 10 meter walk test to >1.6762m/s as to improve gait speed for better community ambulation and to reduce fall risk   Time 8   Period Weeks   Status New     PT LONG TERM GOAL #5   Title Patient will report a worst pain of 3/10 on VAS in   left hip          to improve tolerance with ADLs and reduced symptoms with activities   Time 8   Period Weeks   Status New               Plan - 05/31/16 1524    Clinical Impression Statement Patient is able to perform LE strengthening with minimal increase in rib pain. He has decreased dynamic standing balance and weakness in BLE.    Rehab Potential Good   PT Frequency 2x /  week   PT Duration 8 weeks   PT Treatment/Interventions Gait training;Therapeutic activities;Therapeutic exercise;Balance training;Manual techniques   PT Next Visit Plan progress strength and balance    Consulted and Agree with Plan of Care Patient      Patient will benefit from skilled therapeutic intervention in order to improve the following deficits and impairments:  Difficulty walking, Decreased balance, Decreased strength, Pain, Impaired sensation  Visit Diagnosis: Unsteadiness on feet  Muscle weakness (generalized)     Problem List There are no active problems to display for this patient.  Ezekiel InaKristine S Lucee Brissett, PT, DPT [place], Barkley BrunsKristine S 05/31/2016, 3:55 PM  [place] [address] [place], [place], [address] Phone: [phone]   Fax:  [phone]  Name: Joseph Owen MRN: [id number] Date of Birth: 10-19-1951

## 2016-06-02 ENCOUNTER — Encounter: Payer: Medicare Other | Admitting: Physical Therapy

## 2016-06-06 ENCOUNTER — Encounter: Payer: Self-pay | Admitting: Physical Therapy

## 2016-06-06 ENCOUNTER — Ambulatory Visit: Payer: Medicare Other | Admitting: Physical Therapy

## 2016-06-06 DIAGNOSIS — M6281 Muscle weakness (generalized): Secondary | ICD-10-CM

## 2016-06-06 DIAGNOSIS — R2681 Unsteadiness on feet: Secondary | ICD-10-CM

## 2016-06-06 NOTE — Therapy (Signed)
[place] [facility] [facility] [address] [place], [place], [address] Phone: [phone]   Fax:  [phone]  Physical Therapy Treatment  Patient Details  Name: Joseph Owen MRN: [id number] Date of Birth: 1951-04-25 Referring Provider: Morene CrockerPOTTER, ZACHARY E  Encounter Date: 06/06/2016      PT End of Session - 06/06/16 1522    Visit Number 9   Number of Visits 17   Date for PT Re-Evaluation 06/23/16   Authorization Type g codes   Authorization Time Period 9/10   PT Start Time 0315   PT Stop Time 0400   PT Time Calculation (min) 45 min   Equipment Utilized During Treatment Gait belt   Activity Tolerance Patient tolerated treatment well;Patient limited by pain;Patient limited by fatigue   Behavior [facility] for tasks assessed/performed      History reviewed. No pertinent past medical history.  History reviewed. No pertinent surgical history.  There were no vitals filed for this visit.      Subjective Assessment - 06/06/16 1521    Subjective Pt reports his ribs are slowly feeling better. Mild LE soreness after last session. He says that his knees and hips are not bothering him like they used to    Pertinent History lumbar back surgery x 2, herniated disk cervical spine, right shouler pain/rotator cuff tear/ 3 torn ligaments, left rotator cuff surgery   How long can you stand comfortably? 10  mintues   How long can you walk comfortably? 15 minutes   Diagnostic tests MRI to back 2 years ago, MRI to right shoulder   Patient Stated Goals to walk without pain and become more active   Currently in Pain? No/denies   Pain Onset In the past 7 days   Multiple Pain Sites No      NEUROMUSCULAR RE-EDUCATION Airex NBOS eyes open/closed x 30 seconds each; Airex NBOS eyes open horizontal and vertical head turns x 30 seconds; 1/2 foam flat side down with cone reaching Toe tapping 6 inch stool without UE assist Tandem gait in // bars  x 4 laps  Side stepping on blue  foam balance beam x 5 lengths of the parallel bars     Therapeutic exercise; Leg press x 20 x 3 130 lbs,  heel raises with 90 lbs x 20 x 3 Heel raises standing  4 way hip RTB x 20  Patient responds well to verbal and tactile cues to correct form and technique.   Muscle fatigue but no major pain complaints.                           PT Education - 06/06/16 1522    Education provided Yes   Education Details heel toe gait   Person(s) Educated Patient   Methods Explanation   Comprehension Verbalized understanding             PT Long Term Goals - 04/28/16 1210      PT LONG TERM GOAL #1   Title Patient will be independent in home exercise program to improve strength/mobility for better functional independence with ADLs.   Time 8   Period Weeks   Status New     PT LONG TERM GOAL #2   Title Patient (< 66 years old) will complete five times sit to stand test in < 10 seconds indicating an increased LE strength and improved balance.   Time 8   Period Weeks  Status New     PT LONG TERM GOAL #3   Title Patient will increase six minute walk test distance to >1000 for progression to community ambulator and improve gait ability   Time 8   Period Weeks   Status New     PT LONG TERM GOAL #4   Title  Patient will increase 10 meter walk test to >1.5334m/s as to improve gait speed for better community ambulation and to reduce fall risk   Time 8   Period Weeks   Status New     PT LONG TERM GOAL #5   Title Patient will report a worst pain of 3/10 on VAS in   left hip          to improve tolerance with ADLs and reduced symptoms with activities   Time 8   Period Weeks   Status New               Plan - 06/06/16 1524    Clinical Impression Statement  Fatigue with sit to stand but demonstrating more control, Increase weight for standing exercises. Fatigue still evident with cross trainer and endurance.    Rehab Potential  Good   PT Frequency 2x / week   PT Duration 8 weeks   PT Treatment/Interventions Gait training;Therapeutic activities;Therapeutic exercise;Balance training;Manual techniques   PT Next Visit Plan progress strength and balance    Consulted and Agree with Plan of Care Patient      Patient will benefit from skilled therapeutic intervention in order to improve the following deficits and impairments:  Difficulty walking, Decreased balance, Decreased strength, Pain, Impaired sensation  Visit Diagnosis: Unsteadiness on feet  Muscle weakness (generalized)     Problem List There are no active problems to display for this patient.   Ezekiel [place], Darneisha Windhorst S 06/06/2016, 3:26 PM  [place] [facility] [address] [place], [place], [address] Phone: [phone]   Fax:  [phone]  Name: Joseph Owen MRN: [id number] Date of Birth: [birth year]

## 2016-06-08 ENCOUNTER — Ambulatory Visit: Payer: Medicare Other | Admitting: Physical Therapy

## 2016-06-13 ENCOUNTER — Ambulatory Visit: Payer: Medicare Other | Admitting: Physical Therapy

## 2016-06-13 ENCOUNTER — Encounter: Payer: Self-pay | Admitting: Physical Therapy

## 2016-06-13 DIAGNOSIS — R2681 Unsteadiness on feet: Secondary | ICD-10-CM

## 2016-06-13 DIAGNOSIS — M6281 Muscle weakness (generalized): Secondary | ICD-10-CM

## 2016-06-13 NOTE — Therapy (Signed)
[place] MAIN [facility] SERVICES [address] [place], [place], [address] Phone: [phone]   Fax:  [phone]  Physical Therapy Treatment  Patient Details  Name: Joseph Owen MRN: [id number] Date of Birth: 1951-03-21 Referring Provider: Anabel Bene  Encounter Date: 06/13/2016      PT End of Session - 06/13/16 1509    Visit Number 10   Number of Visits 17   Date for PT Re-Evaluation 2016-06-25   Authorization Type g codes   Authorization Time Period 10/10   PT Start Time 0315   PT Stop Time 0400   PT Time Calculation (min) 45 min   Equipment Utilized During Treatment Gait belt   Activity Tolerance Patient tolerated treatment well;Patient limited by pain;Patient limited by fatigue   Behavior During Therapy [facility] [address] for tasks assessed/performed      History reviewed. No pertinent past medical history.  History reviewed. No pertinent surgical history.  There were no vitals filed for this visit.   Outcome measure performed with improved falls risk and goals partially met and some achieved.   NMR: Bil staggered stance with front foot on dynadisc and balloon taps x 8 min bil tandem stance on 1/2 foam roll (upside down) with no UE support, 3x1 min each; min A throughout as pt demonstrated increased R lateral LOB bil bil stance on 1/2 foam roll with no UE support and EC, 5x10 sec each; increased posterior LOB and decreased ankle strategies noted throughout  1/2 foam in parallel balls tandem standing with CGA Tapping from blue foam to 6 inch stool  Cues for proper technique of exercises, slow eccentric contractions to target specific muscles and facility increased muscle building. Therapeutic rest breaks for energy conservation                        PT Education - 06/13/16 1508    Education provided Yes   Education Details improving step height with gait   Person(s) Educated Patient   Methods Explanation              PT Long Term Goals - 06/13/16 1511      PT LONG TERM GOAL #1   Title Patient will be independent in home exercise program to improve strength/mobility for better functional independence with ADLs.   Time 8   Period Weeks   Status Achieved     PT LONG TERM GOAL #2   Title Patient (65 years old) will complete five times sit to stand test in < 10 seconds indicating an increased LE strength and improved balance.   Baseline 12.90   Time 8   Period Weeks   Status Partially Met     PT LONG TERM GOAL #3   Title Patient will increase six minute walk test distance to >1000 for progression to community ambulator and improve gait ability   Baseline 1200 feet   Time 8   Period Weeks   Status Partially Met     PT LONG TERM GOAL #4   Title  Patient will increase 10 meter walk test to >1.12ms as to improve gait speed for better community ambulation and to reduce fall risk   Baseline 1.05 m/sec   Time 8   Period Weeks   Status Partially Met     PT LONG TERM GOAL #5   Title Patient will report a worst pain of 3/10 on VAS in   left hip  to improve tolerance with ADLs and reduced symptoms with activities   Time 8   Period Weeks   Status Achieved             Patient will benefit from skilled therapeutic intervention in order to improve the following deficits and impairments:     Visit Diagnosis: Unsteadiness on feet  Muscle weakness (generalized)       G-Codes - 07/05/2016 1546    Functional Assessment Tool Used 5 x sit to stand, 10 MW, TUG, 6 MW   Functional Limitation Mobility: Walking and moving around   Mobility: Walking and Moving Around Current Status (D4718) At least 20 percent but less than 40 percent impaired, limited or restricted   Mobility: Walking and Moving Around Goal Status (Z5015) At least 1 percent but less than 20 percent impaired, limited or restricted      Problem List There are no active problems to display for this  patient. Joseph Owen, PT, DPT  [place], Minette Headland S 07-05-16, 3:48 PM  [place] MAIN [facility] SERVICES [address] [place], [place], [address] Phone: [phone]   Fax:  [phone]  Name: Joseph Owen MRN: [id number] Date of Birth: 01-25-1951

## 2016-06-15 ENCOUNTER — Ambulatory Visit: Payer: Medicare Other | Admitting: Physical Therapy

## 2016-06-15 ENCOUNTER — Encounter: Payer: Self-pay | Admitting: Physical Therapy

## 2016-06-15 DIAGNOSIS — R2681 Unsteadiness on feet: Secondary | ICD-10-CM | POA: Diagnosis not present

## 2016-06-15 DIAGNOSIS — M6281 Muscle weakness (generalized): Secondary | ICD-10-CM

## 2016-06-15 NOTE — Therapy (Signed)
[place] MAIN [facility] SERVICES [address] [place], [place], [address] Phone: [phone]   Fax:  [phone]  Physical Therapy Treatment  Patient Details  Name: Joseph Owen MRN: [id number] Date of Birth: 08/22/1951 Referring Provider: Anabel Bene  Encounter Date: 06/15/2016      PT End of Session - 06/15/16 1528    Visit Number 11   Number of Visits 17   Date for PT Re-Evaluation 06/29/16   Authorization Type g codes   Authorization Time Period 1/10   PT Start Time 0315   PT Stop Time 0400   PT Time Calculation (min) 45 min   Equipment Utilized During Treatment Gait belt   Activity Tolerance Patient tolerated treatment well;Patient limited by pain;Patient limited by fatigue   Behavior During Therapy [facility] for tasks assessed/performed      History reviewed. No pertinent past medical history.  History reviewed. No pertinent surgical history.  There were no vitals filed for this visit.      Subjective Assessment - 06/15/16 1527    Subjective Pt reports his ribs are slowly feeling better. Mild LE soreness after last session. He says that his knees and hips are not bothering him like they used to    Pertinent History lumbar back surgery x 2, herniated disk cervical spine, right shouler pain/rotator cuff tear/ 3 torn ligaments, left rotator cuff surgery   How long can you stand comfortably? 10  mintues   How long can you walk comfortably? 15 minutes   Diagnostic tests MRI to back 2 years ago, MRI to right shoulder   Patient Stated Goals to walk without pain and become more active   Currently in Pain? No/denies   Pain Score 0-No pain   Pain Onset In the past 7 days   Multiple Pain Sites No          Therapeutic exercise and neuromuscular training: Leg press 100 lbs x 20 x 3 Heel raises 75 lbs x 20 x 3 Matrix fwd/bwd, side stepping left and right x 5  Tm walking 1. 5 m/hour x 5 mins TM walking side stepping left and  right x . 4 miles / hour x 3 minutes Patient needs occasional verbal cueing to improve posture and cueing to correctly perform exercises slowly, holding at end of range to increase motor firing of desired muscle to encourage fatigue.                          PT Education - 06/15/16 1527    Education provided Yes   Education Details safety with stepping up a step   Person(s) Educated Patient   Methods Explanation   Comprehension Verbalized understanding             PT Long Term Goals - 06/13/16 1511      PT LONG TERM GOAL #1   Title Patient will be independent in home exercise program to improve strength/mobility for better functional independence with ADLs.   Time 8   Period Weeks   Status Achieved     PT LONG TERM GOAL #2   Title Patient (< 75 years old) will complete five times sit to stand test in < 10 seconds indicating an increased LE strength and improved balance.   Baseline 12.90   Time 8   Period Weeks   Status Partially Met     PT LONG TERM GOAL #3   Title Patient will  increase six minute walk test distance to >1000 for progression to community ambulator and improve gait ability   Baseline 1200 feet   Time 8   Period Weeks   Status Partially Met     PT LONG TERM GOAL #4   Title  Patient will increase 10 meter walk test to >1.89ms as to improve gait speed for better community ambulation and to reduce fall risk   Baseline 1.05 m/sec   Time 8   Period Weeks   Status Partially Met     PT LONG TERM GOAL #5   Title Patient will report a worst pain of 3/10 on VAS in   left hip          to improve tolerance with ADLs and reduced symptoms with activities   Time 8   Period Weeks   Status Achieved               Plan - 06/15/16 1528    Clinical Impression Statement Cueing was needed during the leg press to control motion out/back; frequent instances of decreased eccentric control were exhibited. Min cueing needed during hip 3-way to  maintain upright posture with knees straight. Pt had good performance of therapeutic exercise today.   Rehab Potential Good   PT Frequency 2x / week   PT Duration 8 weeks   PT Treatment/Interventions Gait training;Therapeutic activities;Therapeutic exercise;Balance training;Manual techniques   PT Next Visit Plan progress strength and balance    Consulted and Agree with Plan of Care Patient      Patient will benefit from skilled therapeutic intervention in order to improve the following deficits and impairments:  Difficulty walking, Decreased balance, Decreased strength, Pain, Impaired sensation  Visit Diagnosis: Unsteadiness on feet  Muscle weakness (generalized)     Problem List There are no active problems to display for this patient. KAlanson Puls PT, DPT  [place] KMinette HeadlandS 06/15/2016, 4:03 PM  [place] [address] [address]Phone: [phone]  Fax:  [phone] Name: Joseph SHINMRN: [id number] of Birth: 04/14/1951-01-20

## 2016-06-20 ENCOUNTER — Ambulatory Visit: Payer: Medicare Other | Admitting: Physical Therapy

## 2016-06-22 ENCOUNTER — Ambulatory Visit: Payer: Medicare Other | Admitting: Physical Therapy

## 2017-07-19 ENCOUNTER — Other Ambulatory Visit: Payer: Self-pay

## 2017-07-19 ENCOUNTER — Ambulatory Visit: Payer: Medicare Other | Admitting: Gastroenterology

## 2017-07-28 ENCOUNTER — Telehealth: Payer: Self-pay | Admitting: Gastroenterology

## 2017-07-28 NOTE — Telephone Encounter (Signed)
LVM to let patient know that I moved his appt time on 10/22 with Dr. Allegra Lai and to please call me back if this wasn't a good time

## 2017-08-14 ENCOUNTER — Ambulatory Visit: Payer: Medicare Other | Admitting: Gastroenterology

## 2017-08-15 ENCOUNTER — Ambulatory Visit: Payer: Medicare Other | Admitting: Gastroenterology

## 2017-09-04 ENCOUNTER — Ambulatory Visit: Payer: Medicare Other | Admitting: Gastroenterology

## 2017-09-28 ENCOUNTER — Ambulatory Visit: Payer: Medicare Other | Admitting: Gastroenterology

## 2017-10-09 ENCOUNTER — Ambulatory Visit: Payer: Medicare Other | Admitting: Gastroenterology

## 2018-05-15 ENCOUNTER — Other Ambulatory Visit: Payer: Self-pay | Admitting: Internal Medicine

## 2018-05-15 DIAGNOSIS — W19XXXA Unspecified fall, initial encounter: Secondary | ICD-10-CM

## 2018-05-17 ENCOUNTER — Ambulatory Visit: Payer: Medicare Other

## 2018-05-17 ENCOUNTER — Ambulatory Visit
Admission: RE | Admit: 2018-05-17 | Discharge: 2018-05-17 | Disposition: A | Discharge status: Home / Self Care | Payer: Medicare Other | Source: Ambulatory Visit | Attending: Internal Medicine | Admitting: Internal Medicine

## 2018-05-17 DIAGNOSIS — Z043 Encounter for examination and observation following other accident: Secondary | ICD-10-CM | POA: Diagnosis present

## 2018-05-17 DIAGNOSIS — Z9181 History of falling: Secondary | ICD-10-CM | POA: Insufficient documentation

## 2018-05-17 DIAGNOSIS — W19XXXA Unspecified fall, initial encounter: Secondary | ICD-10-CM

## 2018-05-17 DIAGNOSIS — R296 Repeated falls: Secondary | ICD-10-CM | POA: Diagnosis not present

## 2020-02-11 ENCOUNTER — Encounter: Payer: Self-pay | Admitting: *Deleted

## 2020-03-26 ENCOUNTER — Ambulatory Visit: Payer: Medicare Other | Admitting: Gastroenterology

## 2020-04-10 ENCOUNTER — Encounter: Payer: Self-pay | Admitting: Internal Medicine

## 2020-07-14 ENCOUNTER — Emergency Department: Payer: Medicare Other

## 2020-07-14 ENCOUNTER — Other Ambulatory Visit: Payer: Self-pay

## 2020-07-14 ENCOUNTER — Inpatient Hospital Stay
Admit: 2020-07-14 | Discharge: 2020-07-14 | Disposition: A | Discharge status: Home / Self Care | Payer: Medicare Other | Attending: Internal Medicine | Admitting: Internal Medicine

## 2020-07-14 ENCOUNTER — Inpatient Hospital Stay
Admission: EM | Admit: 2020-07-14 | Discharge: 2020-07-30 | DRG: 208 | Disposition: A | Discharge status: Skilled Nursing Facility | Payer: Medicare Other | Attending: Internal Medicine | Admitting: Internal Medicine

## 2020-07-14 ENCOUNTER — Inpatient Hospital Stay: Payer: Medicare Other

## 2020-07-14 DIAGNOSIS — E871 Hypo-osmolality and hyponatremia: Secondary | ICD-10-CM | POA: Diagnosis not present

## 2020-07-14 DIAGNOSIS — N390 Urinary tract infection, site not specified: Secondary | ICD-10-CM | POA: Diagnosis present

## 2020-07-14 DIAGNOSIS — R509 Fever, unspecified: Secondary | ICD-10-CM

## 2020-07-14 DIAGNOSIS — G9340 Encephalopathy, unspecified: Secondary | ICD-10-CM

## 2020-07-14 DIAGNOSIS — M7989 Other specified soft tissue disorders: Secondary | ICD-10-CM | POA: Diagnosis not present

## 2020-07-14 DIAGNOSIS — L8990 Pressure ulcer of unspecified site, unspecified stage: Secondary | ICD-10-CM | POA: Insufficient documentation

## 2020-07-14 DIAGNOSIS — F419 Anxiety disorder, unspecified: Secondary | ICD-10-CM | POA: Diagnosis present

## 2020-07-14 DIAGNOSIS — M21372 Foot drop, left foot: Secondary | ICD-10-CM | POA: Diagnosis present

## 2020-07-14 DIAGNOSIS — B965 Pseudomonas (aeruginosa) (mallei) (pseudomallei) as the cause of diseases classified elsewhere: Secondary | ICD-10-CM | POA: Diagnosis present

## 2020-07-14 DIAGNOSIS — E876 Hypokalemia: Secondary | ICD-10-CM | POA: Diagnosis present

## 2020-07-14 DIAGNOSIS — R000 Tachycardia, unspecified: Secondary | ICD-10-CM | POA: Diagnosis present

## 2020-07-14 DIAGNOSIS — F331 Major depressive disorder, recurrent, moderate: Secondary | ICD-10-CM | POA: Diagnosis present

## 2020-07-14 DIAGNOSIS — R40243 Glasgow coma scale score 3-8, unspecified time: Secondary | ICD-10-CM

## 2020-07-14 DIAGNOSIS — R296 Repeated falls: Secondary | ICD-10-CM | POA: Diagnosis present

## 2020-07-14 DIAGNOSIS — Z4659 Encounter for fitting and adjustment of other gastrointestinal appliance and device: Secondary | ICD-10-CM

## 2020-07-14 DIAGNOSIS — N179 Acute kidney failure, unspecified: Secondary | ICD-10-CM | POA: Diagnosis present

## 2020-07-14 DIAGNOSIS — R059 Cough, unspecified: Secondary | ICD-10-CM

## 2020-07-14 DIAGNOSIS — Z20822 Contact with and (suspected) exposure to covid-19: Secondary | ICD-10-CM | POA: Diagnosis present

## 2020-07-14 DIAGNOSIS — R402432 Glasgow coma scale score 3-8, at arrival to emergency department: Secondary | ICD-10-CM

## 2020-07-14 DIAGNOSIS — Z87891 Personal history of nicotine dependence: Secondary | ICD-10-CM

## 2020-07-14 DIAGNOSIS — R6521 Severe sepsis with septic shock: Secondary | ICD-10-CM

## 2020-07-14 DIAGNOSIS — M25511 Pain in right shoulder: Secondary | ICD-10-CM | POA: Diagnosis not present

## 2020-07-14 DIAGNOSIS — E870 Hyperosmolality and hypernatremia: Secondary | ICD-10-CM | POA: Diagnosis not present

## 2020-07-14 DIAGNOSIS — J9690 Respiratory failure, unspecified, unspecified whether with hypoxia or hypercapnia: Secondary | ICD-10-CM | POA: Diagnosis not present

## 2020-07-14 DIAGNOSIS — G8929 Other chronic pain: Secondary | ICD-10-CM | POA: Diagnosis present

## 2020-07-14 DIAGNOSIS — E512 Wernicke's encephalopathy: Secondary | ICD-10-CM | POA: Diagnosis present

## 2020-07-14 DIAGNOSIS — K219 Gastro-esophageal reflux disease without esophagitis: Secondary | ICD-10-CM | POA: Diagnosis present

## 2020-07-14 DIAGNOSIS — Z0189 Encounter for other specified special examinations: Secondary | ICD-10-CM

## 2020-07-14 DIAGNOSIS — Z79899 Other long term (current) drug therapy: Secondary | ICD-10-CM | POA: Diagnosis not present

## 2020-07-14 DIAGNOSIS — N1831 Chronic kidney disease, stage 3a: Secondary | ICD-10-CM | POA: Diagnosis present

## 2020-07-14 DIAGNOSIS — I493 Ventricular premature depolarization: Secondary | ICD-10-CM | POA: Diagnosis present

## 2020-07-14 DIAGNOSIS — R4182 Altered mental status, unspecified: Secondary | ICD-10-CM | POA: Diagnosis not present

## 2020-07-14 DIAGNOSIS — R1310 Dysphagia, unspecified: Secondary | ICD-10-CM | POA: Diagnosis present

## 2020-07-14 DIAGNOSIS — M25552 Pain in left hip: Secondary | ICD-10-CM | POA: Diagnosis not present

## 2020-07-14 DIAGNOSIS — R400 Somnolence: Secondary | ICD-10-CM | POA: Diagnosis not present

## 2020-07-14 DIAGNOSIS — Z9181 History of falling: Secondary | ICD-10-CM

## 2020-07-14 DIAGNOSIS — S0081XA Abrasion of other part of head, initial encounter: Secondary | ICD-10-CM | POA: Diagnosis present

## 2020-07-14 DIAGNOSIS — J9602 Acute respiratory failure with hypercapnia: Secondary | ICD-10-CM | POA: Diagnosis present

## 2020-07-14 DIAGNOSIS — R410 Disorientation, unspecified: Secondary | ICD-10-CM | POA: Diagnosis present

## 2020-07-14 DIAGNOSIS — Z791 Long term (current) use of non-steroidal anti-inflammatories (NSAID): Secondary | ICD-10-CM | POA: Diagnosis not present

## 2020-07-14 DIAGNOSIS — J9601 Acute respiratory failure with hypoxia: Principal | ICD-10-CM

## 2020-07-14 DIAGNOSIS — Z23 Encounter for immunization: Secondary | ICD-10-CM

## 2020-07-14 DIAGNOSIS — I129 Hypertensive chronic kidney disease with stage 1 through stage 4 chronic kidney disease, or unspecified chronic kidney disease: Secondary | ICD-10-CM | POA: Diagnosis present

## 2020-07-14 DIAGNOSIS — W010XXA Fall on same level from slipping, tripping and stumbling without subsequent striking against object, initial encounter: Secondary | ICD-10-CM | POA: Diagnosis not present

## 2020-07-14 DIAGNOSIS — M899 Disorder of bone, unspecified: Secondary | ICD-10-CM | POA: Diagnosis not present

## 2020-07-14 DIAGNOSIS — R519 Headache, unspecified: Secondary | ICD-10-CM | POA: Diagnosis not present

## 2020-07-14 DIAGNOSIS — R404 Transient alteration of awareness: Secondary | ICD-10-CM | POA: Diagnosis not present

## 2020-07-14 DIAGNOSIS — A419 Sepsis, unspecified organism: Secondary | ICD-10-CM

## 2020-07-14 DIAGNOSIS — R402433 Glasgow coma scale score 3-8, at hospital admission: Secondary | ICD-10-CM | POA: Diagnosis not present

## 2020-07-14 LAB — BLOOD GAS, ARTERIAL
Acid-base deficit: 7.4 mmol/L — ABNORMAL HIGH (ref 0.0–2.0)
Bicarbonate: 18.1 mmol/L — ABNORMAL LOW (ref 20.0–28.0)
FIO2: 0.5
MECHVT: 500 mL
O2 Saturation: 95.4 %
PEEP: 5 cmH2O
Patient temperature: 28.9
RATE: 20 resp/min
pCO2 arterial: 25 mmHg — ABNORMAL LOW (ref 32.0–48.0)
pH, Arterial: 7.42 (ref 7.350–7.450)
pO2, Arterial: 50 mmHg — ABNORMAL LOW (ref 83.0–108.0)

## 2020-07-14 LAB — CBC WITH DIFFERENTIAL/PLATELET
Abs Immature Granulocytes: 0.2 10*3/uL — ABNORMAL HIGH (ref 0.00–0.07)
Basophils Absolute: 0.1 10*3/uL (ref 0.0–0.1)
Basophils Relative: 0 %
Eosinophils Absolute: 0 10*3/uL (ref 0.0–0.5)
Eosinophils Relative: 0 %
HCT: 40.5 % (ref 39.0–52.0)
Hemoglobin: 14.3 g/dL (ref 13.0–17.0)
Immature Granulocytes: 1 %
Lymphocytes Relative: 3 %
Lymphs Abs: 0.8 10*3/uL (ref 0.7–4.0)
MCH: 34.3 pg — ABNORMAL HIGH (ref 26.0–34.0)
MCHC: 35.3 g/dL (ref 30.0–36.0)
MCV: 97.1 fL (ref 80.0–100.0)
Monocytes Absolute: 1.7 10*3/uL — ABNORMAL HIGH (ref 0.1–1.0)
Monocytes Relative: 6 %
Neutro Abs: 24.9 10*3/uL — ABNORMAL HIGH (ref 1.7–7.7)
Neutrophils Relative %: 90 %
Platelets: 165 10*3/uL (ref 150–400)
RBC: 4.17 MIL/uL — ABNORMAL LOW (ref 4.22–5.81)
RDW: 12.9 % (ref 11.5–15.5)
Smear Review: NORMAL
WBC: 27.7 10*3/uL — ABNORMAL HIGH (ref 4.0–10.5)
nRBC: 0 % (ref 0.0–0.2)

## 2020-07-14 LAB — ETHANOL: Alcohol, Ethyl (B): <10 mg/dL (ref <10)

## 2020-07-14 LAB — COMPREHENSIVE METABOLIC PANEL
ALT: 22 U/L (ref 0–44)
AST: 62 U/L — ABNORMAL HIGH (ref 15–41)
Albumin: 3.8 g/dL (ref 3.5–5.0)
Alkaline Phosphatase: 86 U/L (ref 38–126)
Anion gap: 18 — ABNORMAL HIGH (ref 5–15)
BUN: 20 mg/dL (ref 8–23)
CO2: 23 mmol/L (ref 22–32)
Calcium: 9.2 mg/dL (ref 8.9–10.3)
Chloride: 96 mmol/L — ABNORMAL LOW (ref 98–111)
Creatinine, Ser: 1.47 mg/dL — ABNORMAL HIGH (ref 0.61–1.24)
GFR calc Af Amer: 55 mL/min — ABNORMAL LOW (ref >60)
GFR calc non Af Amer: 48 mL/min — ABNORMAL LOW (ref >60)
Glucose, Bld: 179 mg/dL — ABNORMAL HIGH (ref 70–99)
Potassium: 2.9 mmol/L — ABNORMAL LOW (ref 3.5–5.1)
Sodium: 137 mmol/L (ref 135–145)
Total Bilirubin: 1.8 mg/dL — ABNORMAL HIGH (ref 0.3–1.2)
Total Protein: 7.2 g/dL (ref 6.5–8.1)

## 2020-07-14 LAB — URINE DRUG SCREEN, QUALITATIVE (ARMC ONLY)
Amphetamines, Ur Screen: NOT DETECTED
Barbiturates, Ur Screen: NOT DETECTED
Benzodiazepine, Ur Scrn: POSITIVE — AB
Cannabinoid 50 Ng, Ur ~~LOC~~: NOT DETECTED
Cocaine Metabolite,Ur ~~LOC~~: NOT DETECTED
MDMA (Ecstasy)Ur Screen: NOT DETECTED
Methadone Scn, Ur: NOT DETECTED
Opiate, Ur Screen: NOT DETECTED
Phencyclidine (PCP) Ur S: NOT DETECTED
Tricyclic, Ur Screen: NOT DETECTED

## 2020-07-14 LAB — URINALYSIS, COMPLETE (UACMP) WITH MICROSCOPIC
Bacteria, UA: NONE SEEN
Bilirubin Urine: NEGATIVE
Glucose, UA: NEGATIVE mg/dL
Ketones, ur: 5 mg/dL — AB
Leukocytes,Ua: NEGATIVE
Nitrite: NEGATIVE
Protein, ur: NEGATIVE mg/dL
Specific Gravity, Urine: 1.015 (ref 1.005–1.030)
pH: 5 (ref 5.0–8.0)

## 2020-07-14 LAB — PROTIME-INR
INR: 1.1 (ref 0.8–1.2)
Prothrombin Time: 14 seconds (ref 11.4–15.2)

## 2020-07-14 LAB — GLUCOSE, CAPILLARY
Glucose-Capillary: 105 mg/dL — ABNORMAL HIGH (ref 70–99)
Glucose-Capillary: 74 mg/dL (ref 70–99)

## 2020-07-14 LAB — T4, FREE: Free T4: 1.07 ng/dL (ref 0.61–1.12)

## 2020-07-14 LAB — CK: Total CK: 1185 U/L — ABNORMAL HIGH (ref 49–397)

## 2020-07-14 LAB — SALICYLATE LEVEL: Salicylate Lvl: <7 mg/dL — ABNORMAL LOW (ref 7.0–30.0)

## 2020-07-14 LAB — LACTIC ACID, PLASMA
Lactic Acid, Venous: 4 mmol/L (ref 0.5–1.9)
Lactic Acid, Venous: 2.5 mmol/L (ref 0.5–1.9)

## 2020-07-14 LAB — APTT: aPTT: 34 seconds (ref 24–36)

## 2020-07-14 LAB — TRIGLYCERIDES: Triglycerides: 82 mg/dL (ref <150)

## 2020-07-14 LAB — ACETAMINOPHEN LEVEL: Acetaminophen (Tylenol), Serum: <10 ug/mL — ABNORMAL LOW (ref 10–30)

## 2020-07-14 LAB — TSH: TSH: 1.872 u[IU]/mL (ref 0.350–4.500)

## 2020-07-14 LAB — SARS CORONAVIRUS 2 BY RT PCR (HOSPITAL ORDER, PERFORMED IN ~~LOC~~ HOSPITAL LAB): SARS Coronavirus 2: NEGATIVE

## 2020-07-14 MED ORDER — SODIUM CHLORIDE 0.9% FLUSH
3.0000 mL | INTRAVENOUS | Status: DC | PRN
  Administered 2020-07-24 – 2020-07-28 (×2): 3 mL via INTRAVENOUS

## 2020-07-14 MED ORDER — SODIUM CHLORIDE 0.9 % IV BOLUS (SEPSIS)
2000.0000 mL | Freq: Once | INTRAVENOUS | Status: AC
  Administered 2020-07-14: 2000 mL via INTRAVENOUS

## 2020-07-14 MED ORDER — METRONIDAZOLE IN NACL 5-0.79 MG/ML-% IV SOLN
500.0000 mg | Freq: Once | INTRAVENOUS | Status: AC
  Administered 2020-07-14: 500 mg via INTRAVENOUS
  Filled 2020-07-14: qty 100

## 2020-07-14 MED ORDER — PROPOFOL 1000 MG/100ML IV EMUL
INTRAVENOUS | Status: AC
  Filled 2020-07-14: qty 100

## 2020-07-14 MED ORDER — SODIUM CHLORIDE 0.9 % IV BOLUS (SEPSIS)
1000.0000 mL | Freq: Once | INTRAVENOUS | Status: AC
  Administered 2020-07-14: 1000 mL via INTRAVENOUS

## 2020-07-14 MED ORDER — MORPHINE SULFATE (PF) 4 MG/ML IV SOLN
4.0000 mg | INTRAVENOUS | Status: DC
  Administered 2020-07-14 – 2020-07-15 (×3): 4 mg via INTRAVENOUS
  Filled 2020-07-14 (×4): qty 1

## 2020-07-14 MED ORDER — FAMOTIDINE IN NACL 20-0.9 MG/50ML-% IV SOLN
20.0000 mg | Freq: Two times a day (BID) | INTRAVENOUS | Status: DC
  Administered 2020-07-14 – 2020-07-22 (×16): 20 mg via INTRAVENOUS
  Filled 2020-07-14 (×16): qty 50

## 2020-07-14 MED ORDER — KETAMINE HCL 10 MG/ML IJ SOLN
100.0000 mg | Freq: Once | INTRAMUSCULAR | Status: AC
  Administered 2020-07-14: 100 mg via INTRAVENOUS

## 2020-07-14 MED ORDER — PERFLUTREN LIPID MICROSPHERE
1.0000 mL | INTRAVENOUS | Status: AC | PRN
  Administered 2020-07-14: 2 mL via INTRAVENOUS
  Filled 2020-07-14: qty 10

## 2020-07-14 MED ORDER — MIDAZOLAM 50MG/50ML (1MG/ML) PREMIX INFUSION: 0.5000 mg/h | INTRAVENOUS | Status: DC

## 2020-07-14 MED ORDER — SODIUM CHLORIDE 0.9% FLUSH
3.0000 mL | Freq: Two times a day (BID) | INTRAVENOUS | Status: DC
  Administered 2020-07-14 – 2020-07-28 (×26): 3 mL via INTRAVENOUS

## 2020-07-14 MED ORDER — SODIUM CHLORIDE 0.9 % IV SOLN: Freq: Once | INTRAVENOUS | Status: DC

## 2020-07-14 MED ORDER — CHLORHEXIDINE GLUCONATE 0.12% ORAL RINSE (MEDLINE KIT)
15.0000 mL | Freq: Two times a day (BID) | OROMUCOSAL | Status: DC
  Administered 2020-07-14 – 2020-07-16 (×4): 15 mL via OROMUCOSAL

## 2020-07-14 MED ORDER — POLYETHYLENE GLYCOL 3350 17 G PO PACK
17.0000 g | PACK | Freq: Every day | ORAL | Status: DC | PRN
  Administered 2020-07-29: 17 g via ORAL
  Filled 2020-07-14: qty 1

## 2020-07-14 MED ORDER — FENTANYL CITRATE (PF) 100 MCG/2ML IJ SOLN: 50.0000 ug | INTRAMUSCULAR | Status: DC | PRN

## 2020-07-14 MED ORDER — ACETAMINOPHEN 325 MG PO TABS
650.0000 mg | ORAL_TABLET | ORAL | Status: DC | PRN
  Administered 2020-07-23 – 2020-07-30 (×7): 650 mg via ORAL
  Filled 2020-07-14 (×8): qty 2

## 2020-07-14 MED ORDER — SODIUM CHLORIDE 0.9 % IV SOLN
2.0000 g | Freq: Once | INTRAVENOUS | Status: AC
  Administered 2020-07-14: 2 g via INTRAVENOUS
  Filled 2020-07-14: qty 2

## 2020-07-14 MED ORDER — FENTANYL CITRATE (PF) 100 MCG/2ML IJ SOLN: 50.0000 ug | Freq: Once | INTRAMUSCULAR | Status: DC | PRN

## 2020-07-14 MED ORDER — SODIUM CHLORIDE 0.9 % IV SOLN
250.0000 mL | INTRAVENOUS | Status: DC | PRN
  Administered 2020-07-20 – 2020-07-28 (×3): 250 mL via INTRAVENOUS

## 2020-07-14 MED ORDER — ONDANSETRON HCL 4 MG/2ML IJ SOLN: 4.0000 mg | Freq: Four times a day (QID) | INTRAMUSCULAR | Status: DC | PRN

## 2020-07-14 MED ORDER — FENTANYL CITRATE (PF) 100 MCG/2ML IJ SOLN
INTRAMUSCULAR | Status: AC
  Administered 2020-07-14: 50 ug via INTRAVENOUS
  Filled 2020-07-14: qty 2

## 2020-07-14 MED ORDER — DOCUSATE SODIUM 100 MG PO CAPS
100.0000 mg | ORAL_CAPSULE | Freq: Two times a day (BID) | ORAL | Status: DC | PRN
  Administered 2020-07-29: 100 mg via ORAL
  Filled 2020-07-14: qty 1

## 2020-07-14 MED ORDER — VANCOMYCIN HCL 750 MG/150ML IV SOLN
750.0000 mg | Freq: Once | INTRAVENOUS | Status: AC
  Administered 2020-07-14: 750 mg via INTRAVENOUS
  Filled 2020-07-14: qty 150

## 2020-07-14 MED ORDER — PROPOFOL 1000 MG/100ML IV EMUL
5.0000 ug/kg/min | INTRAVENOUS | Status: DC
  Administered 2020-07-14: 20 ug/kg/min via INTRAVENOUS
  Administered 2020-07-15: 15 ug/kg/min via INTRAVENOUS
  Filled 2020-07-14: qty 100

## 2020-07-14 MED ORDER — MIDAZOLAM HCL 50 MG/10ML IJ SOLN
0.5000 mg/h | INTRAVENOUS | Status: DC
  Administered 2020-07-14: 0.5 mg/h via INTRAVENOUS
  Filled 2020-07-14: qty 10

## 2020-07-14 MED ORDER — ENOXAPARIN SODIUM 40 MG/0.4ML ~~LOC~~ SOLN
40.0000 mg | SUBCUTANEOUS | Status: DC
  Administered 2020-07-14 – 2020-07-16 (×3): 40 mg via SUBCUTANEOUS
  Filled 2020-07-14 (×3): qty 0.4

## 2020-07-14 MED ORDER — ORAL CARE MOUTH RINSE
15.0000 mL | OROMUCOSAL | Status: DC
  Administered 2020-07-14 – 2020-07-16 (×16): 15 mL via OROMUCOSAL

## 2020-07-14 MED ORDER — VANCOMYCIN HCL IN DEXTROSE 1-5 GM/200ML-% IV SOLN: 1000.0000 mg | Freq: Once | INTRAVENOUS | Status: DC

## 2020-07-14 MED ORDER — TETANUS-DIPHTH-ACELL PERTUSSIS 5-2.5-18.5 LF-MCG/0.5 IM SUSP
0.5000 mL | Freq: Once | INTRAMUSCULAR | Status: AC
  Administered 2020-07-14: 0.5 mL via INTRAMUSCULAR
  Filled 2020-07-14: qty 0.5

## 2020-07-14 MED ORDER — ROCURONIUM BROMIDE 50 MG/5ML IV SOLN
100.0000 mg | Freq: Once | INTRAVENOUS | Status: AC
  Administered 2020-07-14: 100 mg via INTRAVENOUS
  Filled 2020-07-14: qty 10

## 2020-07-14 NOTE — ED Notes (Signed)
RT at bedside.

## 2020-07-14 NOTE — Sepsis Progress Note (Signed)
Notified bedside nurse of need to administer antibiotics.  

## 2020-07-14 NOTE — ED Notes (Signed)
Date and time results received: 07/14/20 1434   Test: Lactic acid  Critical Value: 4.0  Name of Provider Notified: Dr Scotty Court MD

## 2020-07-14 NOTE — ED Provider Notes (Signed)
Procedures    ----------------------------------------- 3:42 PM on 07/14/2020 -----------------------------------------  Findings and plan of care discussed with patient's brother, Oneita Jolly, MD 07/14/20 [phone]

## 2020-07-14 NOTE — ED Provider Notes (Signed)
[facility] Emergency Department Provider Note  ____________________________________________  Time seen: Approximately 1:59 PM  I have reviewed the triage vital signs and the nursing notes.   HISTORY  Chief Complaint Other Angelica Ran)    Level 5 Caveat: Portions of the History and Physical including HPI and review of systems are unable to be completely obtained due to patient being altered mental status/comatose  HPI CARSON BOGDEN is a 69 y.o. male with a past history of chronic pain and GERD who is brought to the ED by EMS after being found unresponsive.  Family last talked to him yesterday, today they could not get him to answer the phone when they called so they asked EMS to do a wellness check.  EMS found the patient on the bathroom floor with a tub overflowing with water.  Initial temp was 86 degrees.  They placed the patient on a nonrebreather and obtain an oxygen saturation of 92%.      History reviewed. No pertinent past medical history.   Patient Active Problem List   Diagnosis Date Noted  . Falls 03/08/2016  . Left foot drop 03/08/2016     History reviewed. No pertinent surgical history.   Prior to Admission medications   Medication Sig Start Date End Date Taking? Authorizing Provider  fluticasone (FLONASE) 50 MCG/ACT nasal spray Place 2 sprays into both nostrils daily.    Yes [provider]  furosemide (LASIX) 20 MG tablet Take 20 mg by mouth daily. 06/16/20  Yes [provider]  gabapentin (NEURONTIN) 100 MG capsule Take 100 mg by mouth 3 (three) times daily. 06/10/20  Yes [provider]  HYDROcodone-acetaminophen (NORCO/VICODIN) 5-325 MG tablet Take 1 tablet by mouth 2 (two) times daily as needed for moderate pain.    Yes [provider]  meloxicam (MOBIC) 7.5 MG tablet Take 7.5 mg by mouth 2 (two) times daily. 06/10/20  Yes [provider]  mirtazapine (REMERON) 15 MG tablet Take 15 mg by  mouth at bedtime. 06/10/20  Yes [provider]  pantoprazole (PROTONIX) 20 MG tablet Take 20 mg by mouth daily. 06/10/20  Yes [provider]     Allergies Patient has no allergy information on record.   No family history on file.  Social History Social History   Tobacco Use  . Smoking status: Former Games developer  . Smokeless tobacco: Never Used  Substance Use Topics  . Alcohol use: Not on file  . Drug use: Not on file    Review of Systems Level 5 Caveat: Portions of the History and Physical including HPI and review of systems are unable to be completely obtained due to patient being a poor historian   Constitutional:   No known fever.  ENT:   No rhinorrhea. Cardiovascular:   No chest pain or syncope. Respiratory:   No dyspnea or cough. Gastrointestinal:   Negative for abdominal pain, vomiting and diarrhea.  Musculoskeletal:   Negative for focal pain or swelling ____________________________________________   PHYSICAL EXAM:  VITAL SIGNS: ED Triage Vitals  Enc Vitals Group     BP 07/14/20 1327 106/83     Pulse --      Resp 07/14/20 1329 (!) 21     Temp 07/14/20 1345 (!) 88.1 F (31.2 C)     Temp Source 07/14/20 1345 Rectal     SpO2 --      Weight 07/14/20 1326 190 lb (86.2 kg)     Height 07/14/20 1326 6' (1.829 m)  Head Circumference --      Peak Flow --      Pain Score --      Pain Loc --      Pain Edu? --      Excl. in GC? --     Vital signs reviewed, nursing assessments reviewed.   Constitutional: Comatose.. Eyes:   Conjunctivae are normal. EOM untestable. PERRL at 2 mm bilaterally. ENT      Head:   Normocephalic with small laceration at the left lateral supraorbital ridge with periorbital ecchymosis on the left..      Nose:   No congestion/rhinnorhea.  No epistaxis      Mouth/Throat:   Dry mucous membranes, no pharyngeal erythema. No peritonsillar mass.       Neck:   No midline step-off or signs of  trauma. Hematological/Lymphatic/Immunilogical:   No cervical lymphadenopathy. Cardiovascular:   RRR, heart rate 70. Symmetric bilateral radial and DP pulses.  No murmurs. Cap refill 3 seconds. Respiratory:   There is tachypnea with some accessory muscle use.  There are diminished breath sounds diffusely. Gastrointestinal:   Soft and nontender. Non distended. There is no CVA tenderness.  No rebound, rigidity, or guarding.  Musculoskeletal:   No obvious deformities.  Upper extremities held in flexion. Neurologic:   There is decorticate posturing. GCS = E1V1M3 = 5  Skin:    Skin is cool and intact other than abrasion on right elbow and laceration at left forehead.  There is dependent mottling of the skin across the back and extremities.    No petechiae, purpura, or bullae.  ____________________________________________    LABS (pertinent positives/negatives) (all labs ordered are listed, but only abnormal results are displayed) Labs Reviewed  LACTIC ACID, PLASMA - Abnormal; Notable for the following components:      Result Value   Lactic Acid, Venous 4.0 (*)    All other components within normal limits  COMPREHENSIVE METABOLIC PANEL - Abnormal; Notable for the following components:   Potassium 2.9 (*)    Chloride 96 (*)    Glucose, Bld 179 (*)    Creatinine, Ser 1.47 (*)    AST 62 (*)    Total Bilirubin 1.8 (*)    GFR calc non Af Amer 48 (*)    GFR calc Af Amer 55 (*)    Anion gap 18 (*)    All other components within normal limits  CBC WITH DIFFERENTIAL/PLATELET - Abnormal; Notable for the following components:   WBC 27.7 (*)    RBC 4.17 (*)    MCH 34.3 (*)    Neutro Abs 24.9 (*)    Monocytes Absolute 1.7 (*)    Abs Immature Granulocytes 0.20 (*)    All other components within normal limits  URINALYSIS, COMPLETE (UACMP) WITH MICROSCOPIC - Abnormal; Notable for the following components:   Color, Urine AMBER (*)    APPearance HAZY (*)    Hgb urine dipstick SMALL (*)     Ketones, ur 5 (*)    All other components within normal limits  CK - Abnormal; Notable for the following components:   Total CK 1,185 (*)    All other components within normal limits  URINE DRUG SCREEN, QUALITATIVE ([facility] ONLY) - Abnormal; Notable for the following components:   Benzodiazepine, Ur Scrn POSITIVE (*)    All other components within normal limits  BLOOD GAS, ARTERIAL - Abnormal; Notable for the following components:   pCO2 arterial 25 (*)    pO2, Arterial 50 (*)  Bicarbonate 18.1 (*)    Acid-base deficit 7.4 (*)    All other components within normal limits  CULTURE, BLOOD (ROUTINE X 2)  CULTURE, BLOOD (ROUTINE X 2)  URINE CULTURE  SARS CORONAVIRUS 2 BY RT PCR (HOSPITAL ORDER, PERFORMED IN [facility])  PROTIME-INR  APTT  LACTIC ACID, PLASMA   ____________________________________________   EKG    ____________________________________________    RADIOLOGY  DG Abdomen 1 View  Result Date: 07/14/2020 CLINICAL DATA:  Altered mental status EXAM: ABDOMEN - 1 VIEW COMPARISON:  None. FINDINGS: Orogastric tube tip and side port in stomach. No bowel dilatation or air-fluid level to suggest bowel obstruction. No free air. No abnormal calcifications. IMPRESSION: Orogastric tube tip and side port in stomach. No bowel obstruction or free air evident on supine examination. Electronically Signed   By: Bretta Bang III M.D.   On: 07/14/2020 14:14   CT HEAD WO CONTRAST  Result Date: 07/14/2020 CLINICAL DATA:  Found down EXAM: CT HEAD WITHOUT CONTRAST CT MAXILLOFACIAL WITHOUT CONTRAST CT CERVICAL SPINE WITHOUT CONTRAST TECHNIQUE: Multidetector CT imaging of the head, cervical spine, and maxillofacial structures were performed using the standard protocol without intravenous contrast. Multiplanar CT image reconstructions of the cervical spine and maxillofacial structures were also generated. COMPARISON:  May 07, 2018. FINDINGS: CT HEAD FINDINGS Brain: No evidence of  acute infarction, hemorrhage, hydrocephalus, extra-axial collection or mass lesion/mass effect. Vascular: No hyperdense vessel or unexpected calcification. Skull: Normal. Negative for fracture or focal lesion. Other: None. CT MAXILLOFACIAL FINDINGS Osseous: No fracture or mandibular dislocation. No destructive process. Orbits: Negative. No traumatic or inflammatory finding. Sinuses: Mucosal thickening throughout the ethmoid air cells, bilateral sphenoid sinuses, maxillary sinuses and frontal sinuses. Soft tissues: Soft tissue edema of the LEFT frontal scalp. CT CERVICAL SPINE FINDINGS Alignment: Normal. Skull base and vertebrae: No acute fracture. No primary bone lesion or focal pathologic process. Soft tissues and spinal canal: No prevertebral fluid or swelling. No visible canal hematoma. Disc levels: Intervertebral disc space height loss is most pronounced at C5-6 and C6-7. Posterior disc osteophyte complex at C6-7 and C5-6 without high-grade canal stenosis. Multilevel uncovertebral hypertrophy. Multilevel flowing anterior osteophytes. Upper chest: Remote RIGHT clavicular fracture. Other: Mild-to-moderate atherosclerotic calcifications of the RIGHT greater than LEFT carotid bulb. Partial visualization of ETT tube and enteric tube. IMPRESSION: 1.  No acute intracranial abnormality. 2. No acute facial bone fracture. 3. No acute fracture or subluxation of the cervical spine. 4. Soft tissue edema of the LEFT frontal scalp. Electronically Signed   By: Meda Klinefelter MD   On: 07/14/2020 14:47   CT Cervical Spine Wo Contrast  Result Date: 07/14/2020 CLINICAL DATA:  Found down EXAM: CT HEAD WITHOUT CONTRAST CT MAXILLOFACIAL WITHOUT CONTRAST CT CERVICAL SPINE WITHOUT CONTRAST TECHNIQUE: Multidetector CT imaging of the head, cervical spine, and maxillofacial structures were performed using the standard protocol without intravenous contrast. Multiplanar CT image reconstructions of the cervical spine and maxillofacial  structures were also generated. COMPARISON:  May 07, 2018. FINDINGS: CT HEAD FINDINGS Brain: No evidence of acute infarction, hemorrhage, hydrocephalus, extra-axial collection or mass lesion/mass effect. Vascular: No hyperdense vessel or unexpected calcification. Skull: Normal. Negative for fracture or focal lesion. Other: None. CT MAXILLOFACIAL FINDINGS Osseous: No fracture or mandibular dislocation. No destructive process. Orbits: Negative. No traumatic or inflammatory finding. Sinuses: Mucosal thickening throughout the ethmoid air cells, bilateral sphenoid sinuses, maxillary sinuses and frontal sinuses. Soft tissues: Soft tissue edema of the LEFT frontal scalp. CT CERVICAL SPINE FINDINGS Alignment: Normal.  Skull base and vertebrae: No acute fracture. No primary bone lesion or focal pathologic process. Soft tissues and spinal canal: No prevertebral fluid or swelling. No visible canal hematoma. Disc levels: Intervertebral disc space height loss is most pronounced at C5-6 and C6-7. Posterior disc osteophyte complex at C6-7 and C5-6 without high-grade canal stenosis. Multilevel uncovertebral hypertrophy. Multilevel flowing anterior osteophytes. Upper chest: Remote RIGHT clavicular fracture. Other: Mild-to-moderate atherosclerotic calcifications of the RIGHT greater than LEFT carotid bulb. Partial visualization of ETT tube and enteric tube. IMPRESSION: 1.  No acute intracranial abnormality. 2. No acute facial bone fracture. 3. No acute fracture or subluxation of the cervical spine. 4. Soft tissue edema of the LEFT frontal scalp. Electronically Signed   By: Meda Klinefelter MD   On: 07/14/2020 14:47   DG Chest Portable 1 View  Result Date: 07/14/2020 CLINICAL DATA:  Hypoxia EXAM: PORTABLE CHEST 1 VIEW COMPARISON:  August 05, 2010 FINDINGS: Endotracheal tube tip is 5.6 cm above the carina. Nasogastric tube tip is below the diaphragm. No pneumothorax. No edema or airspace opacity. Heart size and pulmonary  vascularity are normal. No adenopathy. Old healed rib fractures noted on each side with remodeling. IMPRESSION: Endotracheal tube and nasogastric tube as described. No pneumothorax. Lungs clear. Cardiac silhouette within normal limits. Electronically Signed   By: Bretta Bang III M.D.   On: 07/14/2020 14:14   CT Maxillofacial Wo Contrast  Result Date: 07/14/2020 CLINICAL DATA:  Found down EXAM: CT HEAD WITHOUT CONTRAST CT MAXILLOFACIAL WITHOUT CONTRAST CT CERVICAL SPINE WITHOUT CONTRAST TECHNIQUE: Multidetector CT imaging of the head, cervical spine, and maxillofacial structures were performed using the standard protocol without intravenous contrast. Multiplanar CT image reconstructions of the cervical spine and maxillofacial structures were also generated. COMPARISON:  May 07, 2018. FINDINGS: CT HEAD FINDINGS Brain: No evidence of acute infarction, hemorrhage, hydrocephalus, extra-axial collection or mass lesion/mass effect. Vascular: No hyperdense vessel or unexpected calcification. Skull: Normal. Negative for fracture or focal lesion. Other: None. CT MAXILLOFACIAL FINDINGS Osseous: No fracture or mandibular dislocation. No destructive process. Orbits: Negative. No traumatic or inflammatory finding. Sinuses: Mucosal thickening throughout the ethmoid air cells, bilateral sphenoid sinuses, maxillary sinuses and frontal sinuses. Soft tissues: Soft tissue edema of the LEFT frontal scalp. CT CERVICAL SPINE FINDINGS Alignment: Normal. Skull base and vertebrae: No acute fracture. No primary bone lesion or focal pathologic process. Soft tissues and spinal canal: No prevertebral fluid or swelling. No visible canal hematoma. Disc levels: Intervertebral disc space height loss is most pronounced at C5-6 and C6-7. Posterior disc osteophyte complex at C6-7 and C5-6 without high-grade canal stenosis. Multilevel uncovertebral hypertrophy. Multilevel flowing anterior osteophytes. Upper chest: Remote RIGHT clavicular  fracture. Other: Mild-to-moderate atherosclerotic calcifications of the RIGHT greater than LEFT carotid bulb. Partial visualization of ETT tube and enteric tube. IMPRESSION: 1.  No acute intracranial abnormality. 2. No acute facial bone fracture. 3. No acute fracture or subluxation of the cervical spine. 4. Soft tissue edema of the LEFT frontal scalp. Electronically Signed   By: Meda Klinefelter MD   On: 07/14/2020 14:47    ____________________________________________   PROCEDURES .Critical Care Performed by: Sharman Cheek, MD Authorized by: Sharman Cheek, MD   Critical care provider statement:    Critical care time (minutes):  40   Critical care time was exclusive of:  Separately billable procedures and treating other patients   Critical care was necessary to treat or prevent imminent or life-threatening deterioration of the following conditions:  Shock, respiratory failure and CNS failure  or compromise   Critical care was time spent personally by me on the following activities:  Development of treatment plan with patient or surrogate, discussions with consultants, evaluation of patient's response to treatment, examination of patient, obtaining history from patient or surrogate, ordering and performing treatments and interventions, ordering and review of laboratory studies, ordering and review of radiographic studies, pulse oximetry, re-evaluation of patient's condition and review of old charts Comments:        Procedure Name: Intubation Date/Time: 07/14/2020 2:04 PM Performed by: Sharman Cheek, MD Pre-anesthesia Checklist: Patient identified, Patient being monitored, Emergency Drugs available, Timeout performed and Suction available Oxygen Delivery Method: Non-rebreather mask Preoxygenation: Pre-oxygenation with 100% oxygen Induction Type: Rapid sequence Ventilation: Mask ventilation without difficulty Laryngoscope Size: Glidescope and 3 Grade View: Grade I Tube type:  Subglottic suction tube Tube size: 8.0 mm Number of attempts: 1 Placement Confirmation: ETT inserted through vocal cords under direct vision,  CO2 detector and Breath sounds checked- equal and bilateral Secured at: 23 cm Tube secured with: Tape Dental Injury: Teeth and Oropharynx as per pre-operative assessment  Comments:       Marland KitchenMarland KitchenLaceration Repair  Date/Time: 07/14/2020 2:55 PM Performed by: Sharman Cheek, MD Authorized by: Sharman Cheek, MD   Consent:    Consent obtained:  Emergent situation   Consent given by: implied consent. Anesthesia (see MAR for exact dosages):    Anesthesia method:  None Laceration details:    Location:  Scalp   Scalp location:  Frontal   Length (cm):  3 Repair type:    Repair type:  Simple Pre-procedure details:    Preparation:  Patient was prepped and draped in usual sterile fashion and imaging obtained to evaluate for foreign bodies Exploration:    Wound exploration: entire depth of wound probed and visualized     Wound extent: no foreign bodies/material noted, no muscle damage noted, no underlying fracture noted and no vascular damage noted     Contaminated: no   Treatment:    Area cleansed with:  Saline and Betadine   Amount of cleaning:  Extensive   Irrigation solution:  Sterile saline   Visualized foreign bodies/material removed: no   Skin repair:    Repair method:  Staples   Number of staples:  3 Approximation:    Approximation:  Close Post-procedure details:    Dressing:  Sterile dressing   Patient tolerance of procedure:  Tolerated well, no immediate complications    ____________________________________________  DIFFERENTIAL DIAGNOSIS   Intracranial hemorrhage, C-spine fracture, facial fracture, dehydration, electrolyte abnormality, sepsis, pneumonia, rhabdomyolysis, renal failure,, cystitis  CLINICAL IMPRESSION / ASSESSMENT AND PLAN / ED COURSE  Medications ordered in the ED: Medications  metroNIDAZOLE (FLAGYL)  IVPB 500 mg (500 mg Intravenous New Bag/Given 07/14/20 1459)  vancomycin (VANCOCIN) IVPB 1000 mg/200 mL premix (has no administration in time range)  vancomycin (VANCOREADY) IVPB 750 mg/150 mL (750 mg Intravenous New Bag/Given 07/14/20 1447)  0.9 %  sodium chloride infusion (has no administration in time range)  morphine 4 MG/ML injection 4 mg (4 mg Intravenous Given 07/14/20 1441)  fentaNYL (SUBLIMAZE) injection 50 mcg (has no administration in time range)  fentaNYL (SUBLIMAZE) injection 50 mcg (has no administration in time range)  fentaNYL (SUBLIMAZE) 100 MCG/2ML injection (has no administration in time range)  midazolam (VERSED) 50 mg in dextrose 5 % 50 mL (1 mg/mL) infusion (0.5 mg/hr Intravenous New Bag/Given 07/14/20 1435)  sodium chloride 0.9 % bolus 2,000 mL (2,000 mLs Intravenous New Bag/Given 07/14/20 1459)  Tdap (  BOOSTRIX) injection 0.5 mL (has no administration in time range)  sodium chloride 0.9 % bolus 1,000 mL (1,000 mLs Intravenous New Bag/Given 07/14/20 1344)  ceFEPIme (MAXIPIME) 2 g in sodium chloride 0.9 % 100 mL IVPB ( Intravenous Stopped 07/14/20 1444)  ketamine (KETALAR) injection 100 mg (100 mg Intravenous Given 07/14/20 1345)  rocuronium (ZEMURON) injection 100 mg (100 mg Intravenous Given 07/14/20 1345)    Pertinent labs & imaging results that were available during my care of the patient were reviewed by me and considered in my medical decision making (see chart for details).   Deforest HoylesRobert E Fluegge was evaluated in Emergency Department on 07/14/2020 for the symptoms described in the history of present illness. He was evaluated in the context of the global COVID-19 pandemic, which necessitated consideration that the patient might be at risk for infection with the SARS-CoV-2 virus that causes COVID-19. Institutional protocols and algorithms that pertain to the evaluation of patients at risk for COVID-19 are in a state of rapid change based on information released by regulatory bodies  including the CDC and federal and state organizations. These policies and algorithms were followed during the patient's care in the ED.   Patient in extremis, will need acute resuscitation, intubation, sepsis work-up management and hospitalization.  Clinical Course as of Jul 15 1503  Tue Jul 14, 2020  1329 Patient presents with altered mental status, found down in the home last seen well 24 hours ago.  He is obtunded, not protecting his airway, concern for intracranial hemorrhage versus sepsis and profound dehydration and electrolyte abnormality.  Will start warm IV fluids, Bair hugger, send labs, plan for intubation.   [PS]  1429 Chest x-ray reviewed by me.  ET tube and OG tube in satisfactory position.  No pneumothorax.  No pneumonia.   [PS]  1442 Lactate 4.0.  Will give a total of 3 L IV fluid bolus for resuscitation of septic shock.   [PS]  1442 CT head reviewed by me, no intracranial hemorrhage   [PS]    Clinical Course User Index [PS] Sharman CheekStafford, Praneeth Bussey, MD     ____________________________________________   FINAL CLINICAL IMPRESSION(S) / ED DIAGNOSES    Final diagnoses:  Glasgow coma scale total score 3-8, at arrival to emergency department [facility](HCC)  Septic shock (HCC)  AKI (acute kidney injury) [facility](HCC)     ED Discharge Orders    None      Portions of this note were generated with dragon dictation software. Dictation errors may occur despite best attempts at proofreading.   Sharman CheekStafford, Malania Gawthrop, MD 07/14/20 1504

## 2020-07-14 NOTE — Consult Note (Signed)
PHARMACY -  BRIEF ANTIBIOTIC NOTE   Pharmacy has received consult(s) for Vancomycin/Cefepime from an ED provider.  The patient's profile has been reviewed for ht/wt/allergies/indication/available labs.    One time order(s) placed for Cefepime 2G IV x 1 and Vancomycin 1000mg  IV x 1  Will order additional 750mg  IV Vancomycin to complete a total load of 1750mg   Further antibiotics/pharmacy consults should be ordered by admitting physician if indicated.                       Thank you,  , PharmD, BCPS Clinical Pharmacist 07/14/2020 1:36 PM

## 2020-07-14 NOTE — Progress Notes (Addendum)
CODE SEPSIS - PHARMACY COMMUNICATION  **Broad Spectrum Antibiotics should be administered within 1 hour of Sepsis diagnosis**  Time Code Sepsis Called/Page Received: 9/21 1330  Antibiotics Ordered: Vancomycin, Cefepime, Metro  Time of 1st antibiotic administration:  1439  Additional action taken by pharmacy: 1420- messaged RN re: hanging abx. 1434- called RN, Florentina Addison.  patient has been in CT and just got back and she is hanging abx  If necessary, Name of Provider/Nurse Contacted:  Fredric Mare ,PharmD Clinical Pharmacist  07/14/2020  2:35 PM

## 2020-07-14 NOTE — ED Notes (Signed)
Md intubating pt at this time

## 2020-07-14 NOTE — ED Notes (Signed)
Pt intubated at this time.  23 at the teeth. Confirmed by good color change and auscultation.

## 2020-07-14 NOTE — H&P (Signed)
Name: Joseph Owen MRN: [id number] DOB: 1951-08-13     CONSULTATION DATE: 07/14/2020  REFERRING MD :  Scotty Court  CHIEF COMPLAINT:  Acute resp failure    HISTORY OF PRESENT ILLNESS:   69 yo seen in ER for acute resp failure EMS called out for wellness check, pt last known well yesterday.  Family last spoke with patient 24 hrs PTA Pt was found in bathroom with water overflowing. Axillary temp with ems was 86.3. Pt with NPA and NRB sats at 92% at 15L. EKg shows sinus tach with pvcs. BP 168 systolic palpated.   Patient in comatosed state and was emergently intubated  PAST MEDICAL HISTORY : Active Ambulatory Problems    Diagnosis Date Noted  . Falls 03/08/2016  . Left foot drop 03/08/2016   Resolved Ambulatory Problems    Diagnosis Date Noted  . No Resolved Ambulatory Problems   No Additional Past Medical History   History reviewed. No pertinent surgical history. Social History   Socioeconomic History  . Marital status: Divorced    Spouse name: Not on file  . Number of children: Not on file  . Years of education: Not on file  . Highest education level: Not on file  Occupational History  . Not on file  Tobacco Use  . Smoking status: Former Games developer  . Smokeless tobacco: Never Used  Substance and Sexual Activity  . Alcohol use: Not on file  . Drug use: Not on file  . Sexual activity: Not on file  Other Topics Concern  . Not on file  Social History Narrative  . Not on file   Social Determinants of Health   Financial Resource Strain:   . Difficulty of Paying Living Expenses: Not on file  Food Insecurity:   . Worried About Programme researcher, broadcasting/film/video in the Last Year: Not on file  . Ran Out of Food in the Last Year: Not on file  Transportation Needs:   . Lack of Transportation (Medical): Not on file  . Lack of Transportation (Non-Medical): Not on file  Physical Activity:   . Days of Exercise per Week: Not on file  . Minutes of Exercise per Session: Not on file    Stress:   . Feeling of Stress : Not on file  Social Connections:   . Frequency of Communication with Friends and Family: Not on file  . Frequency of Social Gatherings with Friends and Family: Not on file  . Attends Religious Services: Not on file  . Active Member of Clubs or Organizations: Not on file  . Attends Banker Meetings: Not on file  . Marital Status: Not on file  Intimate Partner Violence:   . Fear of Current or Ex-Partner: Not on file  . Emotionally Abused: Not on file  . Physically Abused: Not on file  . Sexually Abused: Not on file      Prior to Admission medications   Medication Sig Start Date End Date Taking? Authorizing Provider  fluticasone (FLONASE) 50 MCG/ACT nasal spray Place 2 sprays into both nostrils daily.    Yes [provider]  furosemide (LASIX) 20 MG tablet Take 20 mg by mouth daily. 06/16/20  Yes [provider]  gabapentin (NEURONTIN) 100 MG capsule Take 100 mg by mouth 3 (three) times daily. 06/10/20  Yes [provider]  HYDROcodone-acetaminophen (NORCO/VICODIN) 5-325 MG tablet Take 1 tablet by mouth 2 (two) times daily as needed for moderate pain.    Yes [provider]  meloxicam (MOBIC) 7.5 MG tablet Take 7.5 mg by mouth 2 (two) times daily. 06/10/20  Yes [provider]  mirtazapine (REMERON) 15 MG tablet Take 15 mg by mouth at bedtime. 06/10/20  Yes [provider]  pantoprazole (PROTONIX) 20 MG tablet Take 20 mg by mouth daily. 06/10/20  Yes [provider]   Not on File  FAMILY HISTORY:  family history is not on file. SOCIAL HISTORY:  reports that he has quit smoking. He has never used smokeless tobacco.  REVIEW OF SYSTEMS:   Unable to obtain due to critical illness      Estimated body mass index is 25.77 kg/m as calculated from the following:   Height as of this encounter: 6' (1.829 m).   Weight as of this encounter: 86.2 kg.    VITAL SIGNS: Temp:  [84.9 F  (29.4 C)-88.1 F (31.2 C)] 87.8 F (31 C) (09/21 1445) Pulse Rate:  [31-46] 31 (09/21 1445) Resp:  [21-29] 23 (09/21 1445) BP: (106-144)/(54-118) 144/98 (09/21 1445) SpO2:  [96 %-100 %] 96 % (09/21 1445) FiO2 (%):  [50 %] 50 % (09/21 1445) Weight:  [86.2 kg] 86.2 kg (09/21 1326)   No intake/output data recorded. Total I/O In: 7.3 [IV Piggyback:7.3] Out: -    SpO2: 96 % FiO2 (%): 50 %   Physical Examination:  GENERAL:critically ill appearing, +resp distress HEAD: Normocephalic, atraumatic.  EYES: Pupils equal, round, reactive to light.  No scleral icterus.  MOUTH: Moist mucosal membrane. NECK: Supple. No JVD.  PULMONARY: +rhonchi, +wheezing CARDIOVASCULAR: S1 and S2. Regular rate and rhythm. No murmurs, rubs, or gallops.  GASTROINTESTINAL: Soft, nontender, -distended.  Positive bowel sounds.  MUSCULOSKELETAL: No swelling, clubbing, or edema.  NEUROLOGIC: obtunded SKIN:intact,warm,dry  I personally reviewed lab work that was obtained in last 24 hrs. CXR Independently reviewed-No infiltrates UA NEG CT HEAD NEG   MEDICATIONS: I have reviewed all medications and confirmed regimen as documented       IMAGING    DG Abdomen 1 View  Result Date: 07/14/2020 CLINICAL DATA:  Altered mental status EXAM: ABDOMEN - 1 VIEW COMPARISON:  None. FINDINGS: Orogastric tube tip and side port in stomach. No bowel dilatation or air-fluid level to suggest bowel obstruction. No free air. No abnormal calcifications. IMPRESSION: Orogastric tube tip and side port in stomach. No bowel obstruction or free air evident on supine examination. Electronically Signed   By: Bretta Bang III M.D.   On: 07/14/2020 14:14   CT HEAD WO CONTRAST  Result Date: 07/14/2020 CLINICAL DATA:  Found down EXAM: CT HEAD WITHOUT CONTRAST CT MAXILLOFACIAL WITHOUT CONTRAST CT CERVICAL SPINE WITHOUT CONTRAST TECHNIQUE: Multidetector CT imaging of the head, cervical spine, and maxillofacial structures were  performed using the standard protocol without intravenous contrast. Multiplanar CT image reconstructions of the cervical spine and maxillofacial structures were also generated. COMPARISON:  May 07, 2018. FINDINGS: CT HEAD FINDINGS Brain: No evidence of acute infarction, hemorrhage, hydrocephalus, extra-axial collection or mass lesion/mass effect. Vascular: No hyperdense vessel or unexpected calcification. Skull: Normal. Negative for fracture or focal lesion. Other: None. CT MAXILLOFACIAL FINDINGS Osseous: No fracture or mandibular dislocation. No destructive process. Orbits: Negative. No traumatic or inflammatory finding. Sinuses: Mucosal thickening throughout the ethmoid air cells, bilateral sphenoid sinuses, maxillary sinuses and frontal sinuses. Soft tissues: Soft tissue edema of the LEFT frontal scalp. CT CERVICAL SPINE FINDINGS Alignment: Normal. Skull base and vertebrae: No acute fracture. No primary bone lesion or focal pathologic process. Soft tissues and spinal canal: No prevertebral  fluid or swelling. No visible canal hematoma. Disc levels: Intervertebral disc space height loss is most pronounced at C5-6 and C6-7. Posterior disc osteophyte complex at C6-7 and C5-6 without high-grade canal stenosis. Multilevel uncovertebral hypertrophy. Multilevel flowing anterior osteophytes. Upper chest: Remote RIGHT clavicular fracture. Other: Mild-to-moderate atherosclerotic calcifications of the RIGHT greater than LEFT carotid bulb. Partial visualization of ETT tube and enteric tube. IMPRESSION: 1.  No acute intracranial abnormality. 2. No acute facial bone fracture. 3. No acute fracture or subluxation of the cervical spine. 4. Soft tissue edema of the LEFT frontal scalp. Electronically Signed   By: Meda KlinefelterStephanie  Peacock MD   On: 07/14/2020 14:47   CT Cervical Spine Wo Contrast  Result Date: 07/14/2020 CLINICAL DATA:  Found down EXAM: CT HEAD WITHOUT CONTRAST CT MAXILLOFACIAL WITHOUT CONTRAST CT CERVICAL SPINE  WITHOUT CONTRAST TECHNIQUE: Multidetector CT imaging of the head, cervical spine, and maxillofacial structures were performed using the standard protocol without intravenous contrast. Multiplanar CT image reconstructions of the cervical spine and maxillofacial structures were also generated. COMPARISON:  May 07, 2018. FINDINGS: CT HEAD FINDINGS Brain: No evidence of acute infarction, hemorrhage, hydrocephalus, extra-axial collection or mass lesion/mass effect. Vascular: No hyperdense vessel or unexpected calcification. Skull: Normal. Negative for fracture or focal lesion. Other: None. CT MAXILLOFACIAL FINDINGS Osseous: No fracture or mandibular dislocation. No destructive process. Orbits: Negative. No traumatic or inflammatory finding. Sinuses: Mucosal thickening throughout the ethmoid air cells, bilateral sphenoid sinuses, maxillary sinuses and frontal sinuses. Soft tissues: Soft tissue edema of the LEFT frontal scalp. CT CERVICAL SPINE FINDINGS Alignment: Normal. Skull base and vertebrae: No acute fracture. No primary bone lesion or focal pathologic process. Soft tissues and spinal canal: No prevertebral fluid or swelling. No visible canal hematoma. Disc levels: Intervertebral disc space height loss is most pronounced at C5-6 and C6-7. Posterior disc osteophyte complex at C6-7 and C5-6 without high-grade canal stenosis. Multilevel uncovertebral hypertrophy. Multilevel flowing anterior osteophytes. Upper chest: Remote RIGHT clavicular fracture. Other: Mild-to-moderate atherosclerotic calcifications of the RIGHT greater than LEFT carotid bulb. Partial visualization of ETT tube and enteric tube. IMPRESSION: 1.  No acute intracranial abnormality. 2. No acute facial bone fracture. 3. No acute fracture or subluxation of the cervical spine. 4. Soft tissue edema of the LEFT frontal scalp. Electronically Signed   By: Meda KlinefelterStephanie  Peacock MD   On: 07/14/2020 14:47   DG Chest Portable 1 View  Result Date:  07/14/2020 CLINICAL DATA:  Hypoxia EXAM: PORTABLE CHEST 1 VIEW COMPARISON:  August 05, 2010 FINDINGS: Endotracheal tube tip is 5.6 cm above the carina. Nasogastric tube tip is below the diaphragm. No pneumothorax. No edema or airspace opacity. Heart size and pulmonary vascularity are normal. No adenopathy. Old healed rib fractures noted on each side with remodeling. IMPRESSION: Endotracheal tube and nasogastric tube as described. No pneumothorax. Lungs clear. Cardiac silhouette within normal limits. Electronically Signed   By: Bretta BangWilliam  Woodruff III M.D.   On: 07/14/2020 14:14   CT Maxillofacial Wo Contrast  Result Date: 07/14/2020 CLINICAL DATA:  Found down EXAM: CT HEAD WITHOUT CONTRAST CT MAXILLOFACIAL WITHOUT CONTRAST CT CERVICAL SPINE WITHOUT CONTRAST TECHNIQUE: Multidetector CT imaging of the head, cervical spine, and maxillofacial structures were performed using the standard protocol without intravenous contrast. Multiplanar CT image reconstructions of the cervical spine and maxillofacial structures were also generated. COMPARISON:  May 07, 2018. FINDINGS: CT HEAD FINDINGS Brain: No evidence of acute infarction, hemorrhage, hydrocephalus, extra-axial collection or mass lesion/mass effect. Vascular: No hyperdense vessel or unexpected calcification. Skull: Normal.  Negative for fracture or focal lesion. Other: None. CT MAXILLOFACIAL FINDINGS Osseous: No fracture or mandibular dislocation. No destructive process. Orbits: Negative. No traumatic or inflammatory finding. Sinuses: Mucosal thickening throughout the ethmoid air cells, bilateral sphenoid sinuses, maxillary sinuses and frontal sinuses. Soft tissues: Soft tissue edema of the LEFT frontal scalp. CT CERVICAL SPINE FINDINGS Alignment: Normal. Skull base and vertebrae: No acute fracture. No primary bone lesion or focal pathologic process. Soft tissues and spinal canal: No prevertebral fluid or swelling. No visible canal hematoma. Disc levels:  Intervertebral disc space height loss is most pronounced at C5-6 and C6-7. Posterior disc osteophyte complex at C6-7 and C5-6 without high-grade canal stenosis. Multilevel uncovertebral hypertrophy. Multilevel flowing anterior osteophytes. Upper chest: Remote RIGHT clavicular fracture. Other: Mild-to-moderate atherosclerotic calcifications of the RIGHT greater than LEFT carotid bulb. Partial visualization of ETT tube and enteric tube. IMPRESSION: 1.  No acute intracranial abnormality. 2. No acute facial bone fracture. 3. No acute fracture or subluxation of the cervical spine. 4. Soft tissue edema of the LEFT frontal scalp. Electronically Signed   By: Meda Klinefelter MD   On: 07/14/2020 14:47     Nutrition Status:        BMP Latest Ref Rng & Units 07/14/2020  Glucose 70 - 99 mg/dL 400(Q)  BUN 8 - 23 mg/dL 20  Creatinine 6.76 - 1.95 mg/dL 0.93(O)  Sodium 671 - 245 mmol/L 137  Potassium 3.5 - 5.1 mmol/L 2.9(L)  Chloride 98 - 111 mmol/L 96(L)  CO2 22 - 32 mmol/L 23  Calcium 8.9 - 10.3 mg/dL 9.2    BP (!) 809/98   Pulse (!) 31   Temp (!) 87.8 F (31 C)   Resp (!) 23   Ht 6' (1.829 m)   Wt 86.2 kg   SpO2 96%   BMI 25.77 kg/m    Indwelling Urinary Catheter continued, requirement due to   Reason to continue Indwelling Urinary Catheter strict Intake/Output monitoring for hemodynamic instability         Ventilator continued, requirement due to severe respiratory failure   Ventilator Sedation RASS 0 to -2      ASSESSMENT AND PLAN SYNOPSIS   Severe ACUTE Hypoxic and Hypercapnic Respiratory Failure acute encephalopathy +seizures +CVA -continue Full MV support -Wean Fio2 and PEEP as tolerated VAP/VENT bundle implementation   ACUTE KIDNEY INJURY/Renal Failure -continue Foley Catheter-assess need -Avoid nephrotoxic agents -Follow urine output, BMP -Ensure adequate renal perfusion, optimize oxygenation -Renal dose medications     NEUROLOGY Acute toxic metabolic  encephalopath ?etiology Obtain MRI brain and EEG    CARDIAC ICU monitoring  ID -continue IV abx as prescibed -follow up cultures  GI GI PROPHYLAXIS as indicated  NUTRITIONAL STATUS DIET-->TF's as tolerated Constipation protocol as indicated   ENDO - will use ICU hypoglycemic\Hyperglycemia protocol if needed    ELECTROLYTES -follow labs as needed -replace as needed -pharmacy consultation and following    DVT/GI PRX ordered and assessed TRANSFUSIONS AS NEEDED MONITOR FSBS I Assessed the need for Labs I Assessed the need for Foley I Assessed the need for Central Venous Line Family Discussion when available I Assessed the need for Mobilization I made an Assessment of medications to be adjusted accordingly Safety Risk assessment Completed  CASE DISCUSSED IN MULTIDISCIPLINARY ROUNDS WITH ICU TEAM   Critical Care Time devoted to patient care services described in this note is 75 minutes.   Overall, patient is critically ill, prognosis is guarded.  Patient with Multiorgan failure and at high risk for  cardiac arrest and death.    Corrin Parker, M.D.  [facility]  Medical Director [place] Director [facility] Cardio-Pulmonary Department

## 2020-07-14 NOTE — ED Notes (Signed)
Pt back from CT. Bair hugger placed on patient, temp set up 43C.

## 2020-07-14 NOTE — ED Triage Notes (Signed)
Pt here via [facility] unresponsive.  EMS called out for wellness check, pt last known well yesterday. Pt was found in bathroom with water overflowing. Axillary temp with ems was 86.3. Pt with NPA and NRB sats at 92% at 15L. EKg shows sinus tach with pvcs. BP 168 systolic palpated.

## 2020-07-15 ENCOUNTER — Other Ambulatory Visit: Payer: Self-pay

## 2020-07-15 ENCOUNTER — Inpatient Hospital Stay: Payer: Medicare Other

## 2020-07-15 LAB — CBC
HCT: 30 % — ABNORMAL LOW (ref 39.0–52.0)
Hemoglobin: 11.1 g/dL — ABNORMAL LOW (ref 13.0–17.0)
MCH: 34.4 pg — ABNORMAL HIGH (ref 26.0–34.0)
MCHC: 37 g/dL — ABNORMAL HIGH (ref 30.0–36.0)
MCV: 92.9 fL (ref 80.0–100.0)
Platelets: 154 10*3/uL (ref 150–400)
RBC: 3.23 MIL/uL — ABNORMAL LOW (ref 4.22–5.81)
RDW: 12.6 % (ref 11.5–15.5)
WBC: 18.4 10*3/uL — ABNORMAL HIGH (ref 4.0–10.5)
nRBC: 0 % (ref 0.0–0.2)

## 2020-07-15 LAB — BASIC METABOLIC PANEL
Anion gap: 14 (ref 5–15)
BUN: 24 mg/dL — ABNORMAL HIGH (ref 8–23)
CO2: 22 mmol/L (ref 22–32)
Calcium: 7.9 mg/dL — ABNORMAL LOW (ref 8.9–10.3)
Chloride: 102 mmol/L (ref 98–111)
Creatinine, Ser: 1.57 mg/dL — ABNORMAL HIGH (ref 0.61–1.24)
GFR calc Af Amer: 51 mL/min — ABNORMAL LOW (ref >60)
GFR calc non Af Amer: 44 mL/min — ABNORMAL LOW (ref >60)
Glucose, Bld: 73 mg/dL (ref 70–99)
Potassium: 3.4 mmol/L — ABNORMAL LOW (ref 3.5–5.1)
Sodium: 138 mmol/L (ref 135–145)

## 2020-07-15 LAB — ECHOCARDIOGRAM COMPLETE
Area-P 1/2: 3.03 cm2
Height: 72 in
S' Lateral: 2.81 cm
Weight: 3040 oz

## 2020-07-15 LAB — URINE CULTURE: Culture: NO GROWTH

## 2020-07-15 LAB — BLOOD GAS, ARTERIAL
Acid-base deficit: 2.4 mmol/L — ABNORMAL HIGH (ref 0.0–2.0)
Bicarbonate: 20.4 mmol/L (ref 20.0–28.0)
FIO2: 0.5
MECHVT: 500 mL
Mechanical Rate: 20
O2 Saturation: 99.1 %
PEEP: 5 cmH2O
Patient temperature: 37
pCO2 arterial: 28 mmHg — ABNORMAL LOW (ref 32.0–48.0)
pH, Arterial: 7.47 — ABNORMAL HIGH (ref 7.350–7.450)
pO2, Arterial: 130 mmHg — ABNORMAL HIGH (ref 83.0–108.0)

## 2020-07-15 LAB — GLUCOSE, CAPILLARY
Glucose-Capillary: 117 mg/dL — ABNORMAL HIGH (ref 70–99)
Glucose-Capillary: 67 mg/dL — ABNORMAL LOW (ref 70–99)
Glucose-Capillary: 119 mg/dL — ABNORMAL HIGH (ref 70–99)
Glucose-Capillary: 85 mg/dL (ref 70–99)
Glucose-Capillary: 86 mg/dL (ref 70–99)
Glucose-Capillary: 103 mg/dL — ABNORMAL HIGH (ref 70–99)
Glucose-Capillary: 104 mg/dL — ABNORMAL HIGH (ref 70–99)

## 2020-07-15 LAB — HIV ANTIBODY (ROUTINE TESTING W REFLEX): HIV Screen 4th Generation wRfx: NONREACTIVE

## 2020-07-15 MED ORDER — DIAZEPAM 5 MG/ML IJ SOLN
2.5000 mg | INTRAMUSCULAR | Status: DC | PRN
  Administered 2020-07-15 – 2020-07-16 (×2): 2.5 mg via INTRAVENOUS
  Filled 2020-07-15: qty 2

## 2020-07-15 MED ORDER — NICOTINE 14 MG/24HR TD PT24
14.0000 mg | MEDICATED_PATCH | Freq: Every day | TRANSDERMAL | Status: DC
  Administered 2020-07-15 – 2020-07-30 (×15): 14 mg via TRANSDERMAL
  Filled 2020-07-15 (×15): qty 1

## 2020-07-15 MED ORDER — LACTATED RINGERS IV BOLUS
1000.0000 mL | Freq: Once | INTRAVENOUS | Status: AC
  Administered 2020-07-15: 1000 mL via INTRAVENOUS

## 2020-07-15 MED ORDER — DEXTROSE 10 % IV SOLN: INTRAVENOUS | Status: DC

## 2020-07-15 MED ORDER — CHLORHEXIDINE GLUCONATE CLOTH 2 % EX PADS
6.0000 | MEDICATED_PAD | Freq: Every day | CUTANEOUS | Status: DC
  Administered 2020-07-15 – 2020-07-29 (×13): 6 via TOPICAL

## 2020-07-15 MED ORDER — DIAZEPAM 5 MG/ML IJ SOLN
INTRAMUSCULAR | Status: AC
  Filled 2020-07-15: qty 2

## 2020-07-15 MED ORDER — POTASSIUM CHLORIDE 10 MEQ/100ML IV SOLN
10.0000 meq | INTRAVENOUS | Status: AC
  Administered 2020-07-15 (×2): 10 meq via INTRAVENOUS
  Filled 2020-07-15 (×2): qty 100

## 2020-07-15 MED ORDER — DEXTROSE 50 % IV SOLN
INTRAVENOUS | Status: AC
  Administered 2020-07-15: 50 mL
  Filled 2020-07-15: qty 50

## 2020-07-15 NOTE — H&P (Signed)
Name: Joseph Owen MRN: [id number] DOB: [birth year]     CONSULTATION DATE: 07/14/2020  REFERRING MD :  Scotty Court   9/21 69 yo seen in ER for acute resp failure EMS called out for wellness check, pt last known well yesterday.  Family last spoke with patient 24 hrs PTA Pt was found in bathroom with water overflowing. Axillary temp with ems was 86.3. Pt with NPA and NRB sats at 92% at 15L. EKg shows sinus tach with pvcs. BP 168 systolic palpated.  Patient in comatosed state and was emergently intubated, daughter Coralee North updated 9/22 plan for wake up assessment, remains on vent, critically ill   CHIEF COMPLAINT:   Acute resp failure  HPI Remains intubated critically ill  Vent Mode: PRVC FiO2 (%):  [40 %-50 %] 40 % Set Rate:  [20 bmp] 20 bmp Vt Set:  [500 mL] 500 mL PEEP:  [5 cmH20] 5 cmH20 CBC    Component Value Date/Time   WBC 18.4 (H) 07/15/2020 0427   RBC 3.23 (L) 07/15/2020 0427   HGB 11.1 (L) 07/15/2020 0427   HCT 30.0 (L) 07/15/2020 0427   PLT 154 07/15/2020 0427   MCV 92.9 07/15/2020 0427   MCH 34.4 (H) 07/15/2020 0427   MCHC 37.0 (H) 07/15/2020 0427   RDW 12.6 07/15/2020 0427   LYMPHSABS 0.8 07/14/2020 1341   MONOABS 1.7 (H) 07/14/2020 1341   EOSABS 0.0 07/14/2020 1341   BASOSABS 0.1 07/14/2020 1341   BMP Latest Ref Rng & Units 07/15/2020 07/14/2020  Glucose 70 - 99 mg/dL 73 784(O)  BUN 8 - 23 mg/dL 96(E) 20  Creatinine 9.52 - 1.24 mg/dL 8.41(L) 2.44(W)  Sodium 135 - 145 mmol/L 138 137  Potassium 3.5 - 5.1 mmol/L 3.4(L) 2.9(L)  Chloride 98 - 111 mmol/L 102 96(L)  CO2 22 - 32 mmol/L 22 23  Calcium 8.9 - 10.3 mg/dL 7.9(L) 9.2       Prior to Admission medications   Medication Sig Start Date End Date Taking? Authorizing Provider  fluticasone (FLONASE) 50 MCG/ACT nasal spray Place 2 sprays into both nostrils daily.    Yes [provider]  furosemide (LASIX) 20 MG tablet Take 20 mg by mouth daily. 06/16/20  Yes [provider]  gabapentin  (NEURONTIN) 100 MG capsule Take 100 mg by mouth 3 (three) times daily. 06/10/20  Yes [provider]  HYDROcodone-acetaminophen (NORCO/VICODIN) 5-325 MG tablet Take 1 tablet by mouth 2 (two) times daily as needed for moderate pain.    Yes [provider]  meloxicam (MOBIC) 7.5 MG tablet Take 7.5 mg by mouth 2 (two) times daily. 06/10/20  Yes [provider]  mirtazapine (REMERON) 15 MG tablet Take 15 mg by mouth at bedtime. 06/10/20  Yes [provider]  pantoprazole (PROTONIX) 20 MG tablet Take 20 mg by mouth daily. 06/10/20  Yes [provider]   Not on File  REVIEW OF SYSTEMS  PATIENT IS UNABLE TO PROVIDE COMPLETE REVIEW OF SYSTEM S DUE TO SEVERE CRITICAL ILLNESS AND ENCEPHALOPATHY   Estimated body mass index is 24.48 kg/m as calculated from the following:   Height as of this encounter: 6' 0.01" (1.829 m).   Weight as of this encounter: 81.9 kg.    VITAL SIGNS: Temp:  [84.9 F (29.4 C)-100 F (37.8 C)] 97.7 F (36.5 C) (09/22 1000) Pulse Rate:  [31-108] 81 (09/22 1000) Resp:  [20-29] 20 (09/22 1000) BP: (74-173)/(54-119) 87/60 (09/22 1000) SpO2:  [89 %-100 %] 95 % (09/22 1000) FiO2 (%):  [  40 %-50 %] 40 % (09/22 0831) Weight:  [81.9 kg-86.2 kg] 81.9 kg (09/22 0500)   I/O last 3 completed shifts: In: 4153.3 [I.V.:95.7; IV Piggyback:4057.6] Out: 785 [Urine:460; Other:325] Total I/O In: 160.1 [I.V.:116.1; IV Piggyback:44] Out: -    SpO2: 95 % FiO2 (%): 40 %  PHYSICAL EXAMINATION:  GENERAL:critically ill appearing, +resp distress HEAD: Normocephalic, atraumatic.  EYES: Pupils equal, round, reactive to light.  No scleral icterus.  MOUTH: Moist mucosal membrane. NECK: Supple. No thyromegaly. No nodules. No JVD.  PULMONARY: +rhonchi, +wheezing CARDIOVASCULAR: S1 and S2. Regular rate and rhythm. No murmurs, rubs, or gallops.  GASTROINTESTINAL: Soft, nontender, -distended. Positive bowel sounds.  MUSCULOSKELETAL: No swelling,  clubbing, or edema.  NEUROLOGIC: obtunded SKIN:intact,warm,dry    I personally reviewed lab work that was obtained in last 24 hrs. CXR Independently reviewed-No infiltrates UA NEG CT HEAD NEG   MEDICATIONS: I have reviewed all medications and confirmed regimen as documented       IMAGING    DG Abd 1 View  Result Date: 07/14/2020 CLINICAL DATA:  Enteric catheter placement EXAM: ABDOMEN - 1 VIEW COMPARISON:  07/14/2020 at 2:01 p.m. FINDINGS: Frontal view of the lower chest and upper abdomen demonstrates enteric catheter tip and side port projecting over the gastric fundus. Bowel gas pattern is unremarkable. Indeterminate radiodensities projecting over left upper quadrant, may be on or beneath the patient. IMPRESSION: 1. Enteric catheter tip projecting over gastric fundus. 2. Unremarkable bowel gas pattern. Electronically Signed   By: Sharlet Salina M.D.   On: 07/14/2020 22:35   DG Abdomen 1 View  Result Date: 07/14/2020 CLINICAL DATA:  Altered mental status EXAM: ABDOMEN - 1 VIEW COMPARISON:  None. FINDINGS: Orogastric tube tip and side port in stomach. No bowel dilatation or air-fluid level to suggest bowel obstruction. No free air. No abnormal calcifications. IMPRESSION: Orogastric tube tip and side port in stomach. No bowel obstruction or free air evident on supine examination. Electronically Signed   By: Bretta Bang III M.D.   On: 07/14/2020 14:14   CT HEAD WO CONTRAST  Result Date: 07/14/2020 CLINICAL DATA:  Found down EXAM: CT HEAD WITHOUT CONTRAST CT MAXILLOFACIAL WITHOUT CONTRAST CT CERVICAL SPINE WITHOUT CONTRAST TECHNIQUE: Multidetector CT imaging of the head, cervical spine, and maxillofacial structures were performed using the standard protocol without intravenous contrast. Multiplanar CT image reconstructions of the cervical spine and maxillofacial structures were also generated. COMPARISON:  May 07, 2018. FINDINGS: CT HEAD FINDINGS Brain: No evidence of acute  infarction, hemorrhage, hydrocephalus, extra-axial collection or mass lesion/mass effect. Vascular: No hyperdense vessel or unexpected calcification. Skull: Normal. Negative for fracture or focal lesion. Other: None. CT MAXILLOFACIAL FINDINGS Osseous: No fracture or mandibular dislocation. No destructive process. Orbits: Negative. No traumatic or inflammatory finding. Sinuses: Mucosal thickening throughout the ethmoid air cells, bilateral sphenoid sinuses, maxillary sinuses and frontal sinuses. Soft tissues: Soft tissue edema of the LEFT frontal scalp. CT CERVICAL SPINE FINDINGS Alignment: Normal. Skull base and vertebrae: No acute fracture. No primary bone lesion or focal pathologic process. Soft tissues and spinal canal: No prevertebral fluid or swelling. No visible canal hematoma. Disc levels: Intervertebral disc space height loss is most pronounced at C5-6 and C6-7. Posterior disc osteophyte complex at C6-7 and C5-6 without high-grade canal stenosis. Multilevel uncovertebral hypertrophy. Multilevel flowing anterior osteophytes. Upper chest: Remote RIGHT clavicular fracture. Other: Mild-to-moderate atherosclerotic calcifications of the RIGHT greater than LEFT carotid bulb. Partial visualization of ETT tube and enteric tube. IMPRESSION: 1.  No acute intracranial abnormality.  2. No acute facial bone fracture. 3. No acute fracture or subluxation of the cervical spine. 4. Soft tissue edema of the LEFT frontal scalp. Electronically Signed   By: Meda KlinefelterStephanie  Peacock MD   On: 07/14/2020 14:47   CT Cervical Spine Wo Contrast  Result Date: 07/14/2020 CLINICAL DATA:  Found down EXAM: CT HEAD WITHOUT CONTRAST CT MAXILLOFACIAL WITHOUT CONTRAST CT CERVICAL SPINE WITHOUT CONTRAST TECHNIQUE: Multidetector CT imaging of the head, cervical spine, and maxillofacial structures were performed using the standard protocol without intravenous contrast. Multiplanar CT image reconstructions of the cervical spine and maxillofacial  structures were also generated. COMPARISON:  May 07, 2018. FINDINGS: CT HEAD FINDINGS Brain: No evidence of acute infarction, hemorrhage, hydrocephalus, extra-axial collection or mass lesion/mass effect. Vascular: No hyperdense vessel or unexpected calcification. Skull: Normal. Negative for fracture or focal lesion. Other: None. CT MAXILLOFACIAL FINDINGS Osseous: No fracture or mandibular dislocation. No destructive process. Orbits: Negative. No traumatic or inflammatory finding. Sinuses: Mucosal thickening throughout the ethmoid air cells, bilateral sphenoid sinuses, maxillary sinuses and frontal sinuses. Soft tissues: Soft tissue edema of the LEFT frontal scalp. CT CERVICAL SPINE FINDINGS Alignment: Normal. Skull base and vertebrae: No acute fracture. No primary bone lesion or focal pathologic process. Soft tissues and spinal canal: No prevertebral fluid or swelling. No visible canal hematoma. Disc levels: Intervertebral disc space height loss is most pronounced at C5-6 and C6-7. Posterior disc osteophyte complex at C6-7 and C5-6 without high-grade canal stenosis. Multilevel uncovertebral hypertrophy. Multilevel flowing anterior osteophytes. Upper chest: Remote RIGHT clavicular fracture. Other: Mild-to-moderate atherosclerotic calcifications of the RIGHT greater than LEFT carotid bulb. Partial visualization of ETT tube and enteric tube. IMPRESSION: 1.  No acute intracranial abnormality. 2. No acute facial bone fracture. 3. No acute fracture or subluxation of the cervical spine. 4. Soft tissue edema of the LEFT frontal scalp. Electronically Signed   By: Meda KlinefelterStephanie  Peacock MD   On: 07/14/2020 14:47   DG Chest Port 1 View  Result Date: 07/15/2020 CLINICAL DATA:  Acute respiratory failure EXAM: PORTABLE CHEST 1 VIEW COMPARISON:  Yesterday FINDINGS: Endotracheal tube with tip at the clavicular heads. The enteric tube loops at the stomach. Lower lung volumes. No edema or focal infiltrate. No effusion or  pneumothorax. Remote rib fractures. Normal heart size. IMPRESSION: Stable hardware positioning and symmetric aeration. Electronically Signed   By: Marnee SpringJonathon  Watts M.D.   On: 07/15/2020 04:52   DG Chest Portable 1 View  Result Date: 07/14/2020 CLINICAL DATA:  Hypoxia EXAM: PORTABLE CHEST 1 VIEW COMPARISON:  August 05, 2010 FINDINGS: Endotracheal tube tip is 5.6 cm above the carina. Nasogastric tube tip is below the diaphragm. No pneumothorax. No edema or airspace opacity. Heart size and pulmonary vascularity are normal. No adenopathy. Old healed rib fractures noted on each side with remodeling. IMPRESSION: Endotracheal tube and nasogastric tube as described. No pneumothorax. Lungs clear. Cardiac silhouette within normal limits. Electronically Signed   By: Bretta BangWilliam  Woodruff III M.D.   On: 07/14/2020 14:14   CT Maxillofacial Wo Contrast  Result Date: 07/14/2020 CLINICAL DATA:  Found down EXAM: CT HEAD WITHOUT CONTRAST CT MAXILLOFACIAL WITHOUT CONTRAST CT CERVICAL SPINE WITHOUT CONTRAST TECHNIQUE: Multidetector CT imaging of the head, cervical spine, and maxillofacial structures were performed using the standard protocol without intravenous contrast. Multiplanar CT image reconstructions of the cervical spine and maxillofacial structures were also generated. COMPARISON:  May 07, 2018. FINDINGS: CT HEAD FINDINGS Brain: No evidence of acute infarction, hemorrhage, hydrocephalus, extra-axial collection or mass lesion/mass effect. Vascular: No  hyperdense vessel or unexpected calcification. Skull: Normal. Negative for fracture or focal lesion. Other: None. CT MAXILLOFACIAL FINDINGS Osseous: No fracture or mandibular dislocation. No destructive process. Orbits: Negative. No traumatic or inflammatory finding. Sinuses: Mucosal thickening throughout the ethmoid air cells, bilateral sphenoid sinuses, maxillary sinuses and frontal sinuses. Soft tissues: Soft tissue edema of the LEFT frontal scalp. CT CERVICAL SPINE  FINDINGS Alignment: Normal. Skull base and vertebrae: No acute fracture. No primary bone lesion or focal pathologic process. Soft tissues and spinal canal: No prevertebral fluid or swelling. No visible canal hematoma. Disc levels: Intervertebral disc space height loss is most pronounced at C5-6 and C6-7. Posterior disc osteophyte complex at C6-7 and C5-6 without high-grade canal stenosis. Multilevel uncovertebral hypertrophy. Multilevel flowing anterior osteophytes. Upper chest: Remote RIGHT clavicular fracture. Other: Mild-to-moderate atherosclerotic calcifications of the RIGHT greater than LEFT carotid bulb. Partial visualization of ETT tube and enteric tube. IMPRESSION: 1.  No acute intracranial abnormality. 2. No acute facial bone fracture. 3. No acute fracture or subluxation of the cervical spine. 4. Soft tissue edema of the LEFT frontal scalp. Electronically Signed   By: Meda Klinefelter MD   On: 07/14/2020 14:47     Nutrition Status:        BMP Latest Ref Rng & Units 07/15/2020 07/14/2020  Glucose 70 - 99 mg/dL 73 161(W)  BUN 8 - 23 mg/dL 96(E) 20  Creatinine 4.54 - 1.24 mg/dL 0.98(J) 1.91(Y)  Sodium 135 - 145 mmol/L 138 137  Potassium 3.5 - 5.1 mmol/L 3.4(L) 2.9(L)  Chloride 98 - 111 mmol/L 102 96(L)  CO2 22 - 32 mmol/L 22 23  Calcium 8.9 - 10.3 mg/dL 7.9(L) 9.2    BP (!) 87/60   Pulse 81   Temp 97.7 F (36.5 C)   Resp 20   Ht 6' 0.01" (1.829 m)   Wt 81.9 kg   SpO2 95%   BMI 24.48 kg/m    Indwelling Urinary Catheter continued, requirement due to   Reason to continue Indwelling Urinary Catheter strict Intake/Output monitoring for hemodynamic instability         Ventilator continued, requirement due to severe respiratory failure   Ventilator Sedation RASS 0 to -2      ASSESSMENT AND PLAN SYNOPSIS   Severe ACUTE Hypoxic and Hypercapnic Respiratory Failure acute encephalopathy +seizures +CVA Plan for neuro assessment and extubation if possible  ACUTE KIDNEY  INJURY/Renal Failure -continue Foley Catheter-assess need -Avoid nephrotoxic agents -Follow urine output, BMP -Ensure adequate renal perfusion, optimize oxygenation -Renal dose medications +rhabdomyolysis   NEUROLOGY - intubated and sedated - minimal sedation to achieve a RASS goal: -1  CARDIAC ICU monitoring    GI GI PROPHYLAXIS as indicated  NUTRITIONAL STATUS DIET-->TF's as tolerated Constipation protocol as indicated   ENDO - will use ICU hypoglycemic\Hyperglycemia protocol if needed    ELECTROLYTES -follow labs as needed -replace as needed -pharmacy consultation and following    DVT/GI PRX ordered and assessed TRANSFUSIONS AS NEEDED MONITOR FSBS I Assessed the need for Labs I Assessed the need for Foley I Assessed the need for Central Venous Line Family Discussion when available I Assessed the need for Mobilization I made an Assessment of medications to be adjusted accordingly Safety Risk assessment Completed  CASE DISCUSSED IN MULTIDISCIPLINARY ROUNDS WITH ICU TEAM   Critical Care Time devoted to patient care services described in this note is 32 minutes.      Lucie Leather, M.D.  [facility]  Medical Director [facility]  [facility] Cardio-Pulmonary Department

## 2020-07-15 NOTE — Plan of Care (Signed)
Patient is extubated to 4 lpm nasal canula 

## 2020-07-15 NOTE — Evaluation (Signed)
Clinical/Bedside Swallow Evaluation Patient Details  Name: Joseph Owen MRN: [id number] Date of Birth: 05-26-1951  Today's Date: 07/15/2020 Time: SLP Start Time (ACUTE ONLY): 1630 SLP Stop Time (ACUTE ONLY): 1648 SLP Time Calculation (min) (ACUTE ONLY): 18 min  Past Medical History: History reviewed. No pertinent past medical history. Past Surgical History: History reviewed. No pertinent surgical history. HPI:   Joseph Owen is a 69 y.o. male with a past history of chronic pain and GERD who is brought to the ED by EMS after being found unresponsive.  Family last talked to him on 07/13/2020,  but on 07/14/2020 they could not get him to answer the phone when they called so they asked EMS to do a wellness check.  EMS found the patient on the bathroom floor with a tub overflowing with water.  Initial temp was 86 degrees.  They placed the patient 15 liters nonrebreather and obtained an oxygen saturation of 92%.Pt intubated in the ED, transferred to CCU and extubated on 07/15/2020 at 1150. Chest x-ray and CT head are all negative. Etiology is unknown at this time.    Assessment / Plan / Recommendation Clinical Impression  Pt presents with a number of comorbities that make him at a very high risk of aspiration. Although pt was intubated for 1 day, his voice is almost aphonic, hoarse and breathy. His speech is difficult to understand d/t vocal quality and decreased vocal intensity. At rest, pt displays throat clearing that appears weak and ineffective. Currently pt's mentation appears altered. SLP was going to assess ice chips at bedside but pt declined trials. Given the above risks and inability to provide skilled observation of pt consuming ice chips, recommend pt remain NPO with ST following pt for possible trials.  SLP Visit Diagnosis: Dysphagia, unspecified (R13.10)    Aspiration Risk  Severe aspiration risk;Risk for inadequate nutrition/hydration    Diet Recommendation NPO   Medication  Administration: Via alternative means    Other  Recommendations Oral Care Recommendations: Oral care QID   Follow up Recommendations  (TBD)      Frequency and Duration min 2x/week  2 weeks       Prognosis Prognosis for Safe Diet Advancement: Fair Barriers to Reach Goals: Cognitive deficits;Time post onset;Severity of deficits      Swallow Study   General Date of Onset: 07/14/20 HPI:  Joseph Owen is a 69 y.o. male with a past history of chronic pain and GERD who is brought to the ED by EMS after being found unresponsive.  Family last talked to him on 07/13/2020,  but on 07/14/2020 they could not get him to answer the phone when they called so they asked EMS to do a wellness check.  EMS found the patient on the bathroom floor with a tub overflowing with water.  Initial temp was 86 degrees.  They placed the patient 15 liters nonrebreather and obtained an oxygen saturation of 92%.Pt intubated in the ED, transferred to CCU and extubated on 07/15/2020 at 1150. Chest x-ray and CT head are all negative. Etiology is unknown at this time.  Type of Study: Bedside Swallow Evaluation Previous Swallow Assessment: none in chart Diet Prior to this Study: NPO Temperature Spikes Noted: No Respiratory Status: Nasal cannula (4 liters) History of Recent Intubation: Yes Length of Intubations (days): 1 days Date extubated: 07/15/20 (1150) Behavior/Cognition: Confused;Doesn't follow directions Oral Cavity Assessment: Dried secretions Oral Cavity - Dentition: Adequate natural dentition Patient Positioning: Upright in bed Baseline Vocal Quality: Aphonic;Hoarse;Low vocal intensity  Volitional Cough: Cognitively unable to elicit Volitional Swallow: Unable to elicit    Oral/Motor/Sensory Function Overall Oral Motor/Sensory Function: Within functional limits   Ice Chips Ice chips: Not tested   Thin Liquid Thin Liquid: Not tested    Nectar Thick Nectar Thick Liquid: Not tested   Honey Thick Honey  Thick Liquid: Not tested   Puree Puree: Not tested   Solid     Solid: Not tested     Joseph Owen B. Dreama Saa M.S., CCC-SLP, [facility] Speech-Language Pathologist Rehabilitation Services Office [phone]  Joseph Owen Dreama Saa 07/15/2020,6:19 PM

## 2020-07-16 ENCOUNTER — Inpatient Hospital Stay: Payer: Medicare Other

## 2020-07-16 DIAGNOSIS — R40243 Glasgow coma scale score 3-8, unspecified time: Secondary | ICD-10-CM

## 2020-07-16 DIAGNOSIS — R404 Transient alteration of awareness: Secondary | ICD-10-CM | POA: Diagnosis not present

## 2020-07-16 DIAGNOSIS — R4182 Altered mental status, unspecified: Secondary | ICD-10-CM | POA: Diagnosis not present

## 2020-07-16 DIAGNOSIS — M7989 Other specified soft tissue disorders: Secondary | ICD-10-CM | POA: Diagnosis not present

## 2020-07-16 DIAGNOSIS — N179 Acute kidney failure, unspecified: Secondary | ICD-10-CM

## 2020-07-16 DIAGNOSIS — J9601 Acute respiratory failure with hypoxia: Secondary | ICD-10-CM | POA: Diagnosis not present

## 2020-07-16 DIAGNOSIS — R402432 Glasgow coma scale score 3-8, at arrival to emergency department: Secondary | ICD-10-CM

## 2020-07-16 LAB — BASIC METABOLIC PANEL
Anion gap: 8 (ref 5–15)
BUN: 24 mg/dL — ABNORMAL HIGH (ref 8–23)
CO2: 26 mmol/L (ref 22–32)
Calcium: 8 mg/dL — ABNORMAL LOW (ref 8.9–10.3)
Chloride: 102 mmol/L (ref 98–111)
Creatinine, Ser: 1.29 mg/dL — ABNORMAL HIGH (ref 0.61–1.24)
GFR calc Af Amer: >60 mL/min (ref >60)
GFR calc non Af Amer: 56 mL/min — ABNORMAL LOW (ref >60)
Glucose, Bld: 114 mg/dL — ABNORMAL HIGH (ref 70–99)
Potassium: 3.1 mmol/L — ABNORMAL LOW (ref 3.5–5.1)
Sodium: 136 mmol/L (ref 135–145)

## 2020-07-16 LAB — GLUCOSE, CAPILLARY
Glucose-Capillary: 100 mg/dL — ABNORMAL HIGH (ref 70–99)
Glucose-Capillary: 103 mg/dL — ABNORMAL HIGH (ref 70–99)
Glucose-Capillary: 107 mg/dL — ABNORMAL HIGH (ref 70–99)
Glucose-Capillary: 115 mg/dL — ABNORMAL HIGH (ref 70–99)
Glucose-Capillary: 128 mg/dL — ABNORMAL HIGH (ref 70–99)
Glucose-Capillary: 128 mg/dL — ABNORMAL HIGH (ref 70–99)
Glucose-Capillary: 99 mg/dL (ref 70–99)

## 2020-07-16 LAB — CBC WITH DIFFERENTIAL/PLATELET
Abs Immature Granulocytes: 0.15 10*3/uL — ABNORMAL HIGH (ref 0.00–0.07)
Basophils Absolute: 0.1 10*3/uL (ref 0.0–0.1)
Basophils Relative: 0 %
Eosinophils Absolute: 0 10*3/uL (ref 0.0–0.5)
Eosinophils Relative: 0 %
HCT: 31.9 % — ABNORMAL LOW (ref 39.0–52.0)
Hemoglobin: 11.1 g/dL — ABNORMAL LOW (ref 13.0–17.0)
Immature Granulocytes: 1 %
Lymphocytes Relative: 3 %
Lymphs Abs: 0.6 10*3/uL — ABNORMAL LOW (ref 0.7–4.0)
MCH: 33.3 pg (ref 26.0–34.0)
MCHC: 34.8 g/dL (ref 30.0–36.0)
MCV: 95.8 fL (ref 80.0–100.0)
Monocytes Absolute: 1.2 10*3/uL — ABNORMAL HIGH (ref 0.1–1.0)
Monocytes Relative: 6 %
Neutro Abs: 16.6 10*3/uL — ABNORMAL HIGH (ref 1.7–7.7)
Neutrophils Relative %: 90 %
Platelets: 157 10*3/uL (ref 150–400)
RBC: 3.33 MIL/uL — ABNORMAL LOW (ref 4.22–5.81)
RDW: 13.1 % (ref 11.5–15.5)
WBC: 18.6 10*3/uL — ABNORMAL HIGH (ref 4.0–10.5)
nRBC: 0 % (ref 0.0–0.2)

## 2020-07-16 LAB — CK: Total CK: 693 U/L — ABNORMAL HIGH (ref 49–397)

## 2020-07-16 LAB — TROPONIN I (HIGH SENSITIVITY)
Troponin I (High Sensitivity): 60 ng/L — ABNORMAL HIGH (ref <18)
Troponin I (High Sensitivity): 55 ng/L — ABNORMAL HIGH (ref <18)

## 2020-07-16 LAB — MAGNESIUM: Magnesium: 1.3 mg/dL — ABNORMAL LOW (ref 1.7–2.4)

## 2020-07-16 LAB — LACTIC ACID, PLASMA
Lactic Acid, Venous: 1.4 mmol/L (ref 0.5–1.9)
Lactic Acid, Venous: 1.1 mmol/L (ref 0.5–1.9)

## 2020-07-16 LAB — PHOSPHORUS: Phosphorus: 2.1 mg/dL — ABNORMAL LOW (ref 2.5–4.6)

## 2020-07-16 MED ORDER — SODIUM CHLORIDE 0.9 % IV SOLN
1.0000 mg | Freq: Once | INTRAVENOUS | Status: AC
  Administered 2020-07-16: 1 mg via INTRAVENOUS
  Filled 2020-07-16: qty 0.2

## 2020-07-16 MED ORDER — THIAMINE HCL 100 MG/ML IJ SOLN
100.0000 mg | Freq: Every day | INTRAMUSCULAR | Status: DC
  Administered 2020-07-16 – 2020-07-19 (×4): 100 mg via INTRAVENOUS
  Filled 2020-07-16 (×4): qty 2

## 2020-07-16 MED ORDER — MAGNESIUM SULFATE 4 GM/100ML IV SOLN
4.0000 g | Freq: Once | INTRAVENOUS | Status: AC
  Administered 2020-07-16: 4 g via INTRAVENOUS
  Filled 2020-07-16: qty 100

## 2020-07-16 MED ORDER — CHLORHEXIDINE GLUCONATE 0.12 % MT SOLN
OROMUCOSAL | Status: AC
  Administered 2020-07-16: 15 mL
  Filled 2020-07-16: qty 15

## 2020-07-16 MED ORDER — PANTOPRAZOLE SODIUM 20 MG PO TBEC
20.0000 mg | DELAYED_RELEASE_TABLET | Freq: Every day | ORAL | Status: DC
  Filled 2020-07-16 (×4): qty 1

## 2020-07-16 MED ORDER — POTASSIUM CHLORIDE 10 MEQ/100ML IV SOLN
10.0000 meq | INTRAVENOUS | Status: AC
  Administered 2020-07-16 (×3): 10 meq via INTRAVENOUS
  Filled 2020-07-16 (×3): qty 100

## 2020-07-16 MED ORDER — DEXTROSE-NACL 5-0.9 % IV SOLN: INTRAVENOUS | Status: DC

## 2020-07-16 MED ORDER — MORPHINE SULFATE (PF) 2 MG/ML IV SOLN
2.0000 mg | INTRAVENOUS | Status: DC | PRN
  Administered 2020-07-17: 2 mg via INTRAVENOUS
  Filled 2020-07-16: qty 1

## 2020-07-16 MED ORDER — MIRTAZAPINE 15 MG PO TABS
15.0000 mg | ORAL_TABLET | Freq: Every day | ORAL | Status: DC
  Administered 2020-07-19 – 2020-07-29 (×10): 15 mg via ORAL
  Filled 2020-07-16 (×11): qty 1

## 2020-07-16 MED ORDER — ORAL CARE MOUTH RINSE
15.0000 mL | Freq: Two times a day (BID) | OROMUCOSAL | Status: DC
  Administered 2020-07-16 – 2020-07-30 (×26): 15 mL via OROMUCOSAL

## 2020-07-16 MED ORDER — GABAPENTIN 100 MG PO CAPS: 100.0000 mg | ORAL_CAPSULE | Freq: Three times a day (TID) | ORAL | Status: DC

## 2020-07-16 MED ORDER — POTASSIUM PHOSPHATES 15 MMOLE/5ML IV SOLN
20.0000 mmol | Freq: Once | INTRAVENOUS | Status: AC
  Administered 2020-07-16: 20 mmol via INTRAVENOUS
  Filled 2020-07-16: qty 6.67

## 2020-07-16 NOTE — Progress Notes (Signed)
PHARMACY CONSULT NOTE - FOLLOW UP  Pharmacy Consult for Electrolyte Monitoring and Replacement   Recent Labs: Potassium (mmol/L)  Date Value  07/16/2020 3.1 (L)   Magnesium (mg/dL)  Date Value  29/47/6546 1.3 (L)   Calcium (mg/dL)  Date Value  50/35/4656 8.0 (L)   Albumin (g/dL)  Date Value  81/27/5170 3.8   Phosphorus (mg/dL)  Date Value  01/74/9449 2.1 (L)   Sodium (mmol/L)  Date Value  07/16/2020 136     Assessment: 69 year old male admitted for respiratory failure requiring intubation. Patient now extubated, remains somnolent. Patient electrolyte abnormalities, pharmacy consulted to replace.  Goal of Therapy:  Electrolytes WNL  Plan:  Potassium phos 20 mmol IV x 1 + KCl 10 mEq IV x 3 + mag 4 g IV x 1. Will follow up all electrolytes with morning labs.  Pricilla Riffle ,PharmD Clinical Pharmacist 07/16/2020 1:37 PM

## 2020-07-16 NOTE — Progress Notes (Signed)
OT Cancellation Note  Patient Details Name: Joseph Owen MRN: [id number] DOB: 1951/09/30   Cancelled Treatment:    Reason Eval/Treat Not Completed: Medical issues which prohibited therapy. Pt with K+ of 3.1 which is contraindicated for occupational therapy interventions. OT will hold until pt is medically able to participate in OT evaluation.   Jackquline Denmark, MS, OTR/L , CBIS ascom [phone]  07/16/20, 8:39 AM   07/16/2020, 8:38 AM

## 2020-07-16 NOTE — Procedures (Signed)
Patient Name: Joseph Owen  MRN: [id number]  Epilepsy Attending: Charlsie Quest  Referring Physician/Provider: Dr Arnetha Courser Date: 07/16/2020 Duration: 28.11 mins  Patient history: 69 y.o. male admitted 2 days ago for acute hypoxic and hypercapnic respiratory failure, continues to have decreased responsiveness.EE to evaluate for seizure  Level of alertness:  lethargic  AEDs during EEG study: Gabapentin  Technical aspects: This EEG study was done with scalp electrodes positioned according to the 10-20 International system of electrode placement. Electrical activity was acquired at a sampling rate of 500Hz  and reviewed with a high frequency filter of 70Hz  and a low frequency filter of 1Hz . EEG data were recorded continuously and digitally stored.   Description: No posterior dominant rhythm was seen. EEG showed continuous generalized polymorphic3 to 6 Hz theta-delta slowing. Hyperventilation and photic stimulation were not performed.     ABNORMALITY -Continued slow, generalized  IMPRESSION: This study is suggestive of severe diffuse encephalopathy, nonspecific etiology. No seizures or epileptiform discharges were seen throughout the recording.  [name] 

## 2020-07-16 NOTE — Progress Notes (Signed)
SLP Cancellation Note  Patient Details Name: Joseph Owen MRN: [id number] DOB: [birth year]   Cancelled treatment:       Reason Eval/Treat Not Completed: Medical issues which prohibited therapy;Fatigue/lethargy limiting ability to participate;Patient not medically ready (chart reviewed; consulted NSG).  Per observation and attempt to awaken, pt is minimally arouseable exhibiting brief eye opening and mumbled 2 words. NSG reported same. PO trials w/ such decreased alertness and focus for follow through w/ task are not appropriate at this time d/t the High risk for Aspiration. NSG also reported need for increased pharyngeal suctioning this AM revealing Mod+ amount of secretions.  ST services will f/u w/ pt's status tomorrow and appropriateness for introduction of po's. Recommend frequetn oral care for hygiene and stimulation of swallowing. NSG agreed.      Jerilynn Som, MS, CCC-SLP Speech Language Pathologist Rehab Services [phone] [facility] 07/16/2020, 11:42 AM

## 2020-07-16 NOTE — Consult Note (Signed)
Reason for Consult: confusion  Requesting Physician: Dr. Tim LairKassa  CC: confusion    HPI: Joseph Owen is an 69 y.o. male  Admitted 2 days ago for acute hypoxic and hypercapnic respiratory failure.  As per family patient has been falling a lot recently.     History reviewed. No pertinent past medical history.  History reviewed. No pertinent surgical history.  No family history on file.  Social History:  reports that he has quit smoking. He has never used smokeless tobacco. No history on file for alcohol use and drug use.  Not on File  Medications: I have reviewed the patient's current medications.  ROS: Unable to obtain as slow to follow commands.    Physical Examination: Blood pressure 107/81, pulse 80, temperature 98.3 F (36.8 C), temperature source Axillary, resp. rate 18, height 6' 0.01" (1.829 m), weight 81 kg, SpO2 98 %.    Neurological Examination   Mental Status: Alert to name slow to respon Cranial Nerves: III,IV, VI: ptosis not present, extra-ocular motions intact bilaterally V,VII: smile symmetric, facial light touch sensation normal bilaterally VIII: hearing normal bilaterally Motor: Generalized weakness but no clear focality on examination Sensory: withdraws from pain   Laboratory Studies:   Basic Metabolic Panel: Recent Labs  Lab 07/14/20 1341 07/15/20 0427 07/16/20 0726  NA 137 138 136  K 2.9* 3.4* 3.1*  CL 96* 102 102  CO2 23 22 26   GLUCOSE 179* 73 114*  BUN 20 24* 24*  CREATININE 1.47* 1.57* 1.29*  CALCIUM 9.2 7.9* 8.0*  MG  --   --  1.3*  PHOS  --   --  2.1*    Liver Function Tests: Recent Labs  Lab 07/14/20 1341  AST 62*  ALT 22  ALKPHOS 86  BILITOT 1.8*  PROT 7.2  ALBUMIN 3.8   No results for input(s): LIPASE, AMYLASE in the last 168 hours. No results for input(s): AMMONIA in the last 168 hours.  CBC: Recent Labs  Lab 07/14/20 1341 07/15/20 0427 07/16/20 0726  WBC 27.7* 18.4* 18.6*  NEUTROABS 24.9*  --  16.6*   HGB 14.3 11.1* 11.1*  HCT 40.5 30.0* 31.9*  MCV 97.1 92.9 95.8  PLT 165 154 157    Cardiac Enzymes: Recent Labs  Lab 07/14/20 1341  CKTOTAL 1,185*    BNP: Invalid input(s): POCBNP  CBG: Recent Labs  Lab 07/15/20 1609 07/15/20 1935 07/16/20 0032 07/16/20 0321 07/16/20 0731  GLUCAP 103* 104* 103* 99 107*    Microbiology: Results for orders placed or performed during the hospital encounter of 07/14/20  Blood Culture (routine x 2)     Status: None (Preliminary result)   Collection Time: 07/14/20  1:41 PM   Specimen: BLOOD  Result Value Ref Range Status   Specimen Description BLOOD BLOOD RIGHT FOREARM  Final   Special Requests   Final    BOTTLES DRAWN AEROBIC AND ANAEROBIC Blood Culture adequate volume   Culture   Final    NO GROWTH 2 DAYS Performed at [facility], [address] [place], [place] [address]    Report Status PENDING  Incomplete  Blood Culture (routine x 2)     Status: None (Preliminary result)   Collection Time: 07/14/20  1:41 PM   Specimen: BLOOD  Result Value Ref Range Status   Specimen Description BLOOD BLOOD RIGHT HAND  Final   Special Requests   Final    BOTTLES DRAWN AEROBIC AND ANAEROBIC Blood Culture adequate volume   Culture   Final  NO GROWTH 2 DAYS Performed at [facility], [address] [place], [place] [address]    Report Status PENDING  Incomplete  Urine culture     Status: None   Collection Time: 07/14/20  1:41 PM   Specimen: In/Out Cath Urine  Result Value Ref Range Status   Specimen Description   Final    IN/OUT CATH URINE Performed at [facility], [address] [place], [place] [address]    Special Requests   Final    NONE Performed at [facility], [address], [place], [place] [address]    Culture   Final    NO GROWTH Performed at [facility], [address], [place], [place] [address]    Report Status 07/15/2020 FINAL  Final  SARS Coronavirus 2 by RT PCR  (hospital order, performed in [facility] hospital lab) Nasopharyngeal Nasopharyngeal Swab     Status: None   Collection Time: 07/14/20  1:41 PM   Specimen: Nasopharyngeal Swab  Result Value Ref Range Status   SARS Coronavirus 2 NEGATIVE NEGATIVE Final    Comment: (NOTE) SARS-CoV-2 target nucleic acids are NOT DETECTED.  The SARS-CoV-2 RNA is generally detectable in upper and lower respiratory specimens during the acute phase of infection. The lowest concentration of SARS-CoV-2 viral copies this assay can detect is 250 copies / mL. A negative result does not preclude SARS-CoV-2 infection and should not be used as the sole basis for treatment or other patient management decisions.  A negative result may occur with improper specimen collection / handling, submission of specimen other than nasopharyngeal swab, presence of viral mutation(s) within the areas targeted by this assay, and inadequate number of viral copies (<250 copies / mL). A negative result must be combined with clinical observations, patient history, and epidemiological information.  Fact Sheet for Patients:   BoilerBrush.com.cy  Fact Sheet for Healthcare Providers: https://pope.com/  This test is not yet approved or  cleared by the [place] FDA and has been authorized for detection and/or diagnosis of SARS-CoV-2 by FDA under an Emergency Use Authorization (EUA).  This EUA will remain in effect (meaning this test can be used) for the duration of the COVID-19 declaration under Section 564(b)(1) of the Act, 21 U.S.C. section 360bbb-3(b)(1), unless the authorization is terminated or revoked sooner.  Performed at [facility], [address] [place], [place] [address]     Coagulation Studies: Recent Labs    07/14/20 1341  LABPROT 14.0  INR 1.1    Urinalysis:  Recent Labs  Lab 07/14/20 1341  COLORURINE AMBER*  LABSPEC 1.015  PHURINE 5.0   GLUCOSEU NEGATIVE  HGBUR SMALL*  BILIRUBINUR NEGATIVE  KETONESUR 5*  PROTEINUR NEGATIVE  NITRITE NEGATIVE  LEUKOCYTESUR NEGATIVE    Lipid Panel:     Component Value Date/Time   TRIG 82 07/14/2020 1341    HgbA1C: No results found for: HGBA1C  Urine Drug Screen:      Component Value Date/Time   LABOPIA NONE DETECTED 07/14/2020 1341   COCAINSCRNUR NONE DETECTED 07/14/2020 1341   LABBENZ POSITIVE (A) 07/14/2020 1341   AMPHETMU NONE DETECTED 07/14/2020 1341   THCU NONE DETECTED 07/14/2020 1341   LABBARB NONE DETECTED 07/14/2020 1341    Alcohol Level:  Recent Labs  Lab 07/14/20 1341  ETH <10    Other results: EKG: normal EKG, normal sinus rhythm, unchanged from previous tracings.  Imaging: DG Abd 1 View  Result Date: 07/14/2020 CLINICAL DATA:  Enteric catheter placement  EXAM: ABDOMEN - 1 VIEW COMPARISON:  07/14/2020 at 2:01 p.m. FINDINGS: Frontal view of the lower chest and upper abdomen demonstrates enteric catheter tip and side port projecting over the gastric fundus. Bowel gas pattern is unremarkable. Indeterminate radiodensities projecting over left upper quadrant, may be on or beneath the patient. IMPRESSION: 1. Enteric catheter tip projecting over gastric fundus. 2. Unremarkable bowel gas pattern. Electronically Signed   By: Sharlet Salina M.D.   On: 07/14/2020 22:35   DG Abdomen 1 View  Result Date: 07/14/2020 CLINICAL DATA:  Altered mental status EXAM: ABDOMEN - 1 VIEW COMPARISON:  None. FINDINGS: Orogastric tube tip and side port in stomach. No bowel dilatation or air-fluid level to suggest bowel obstruction. No free air. No abnormal calcifications. IMPRESSION: Orogastric tube tip and side port in stomach. No bowel obstruction or free air evident on supine examination. Electronically Signed   By: Bretta Bang III M.D.   On: 07/14/2020 14:14   CT HEAD WO CONTRAST  Result Date: 07/14/2020 CLINICAL DATA:  Found down EXAM: CT HEAD WITHOUT CONTRAST CT  MAXILLOFACIAL WITHOUT CONTRAST CT CERVICAL SPINE WITHOUT CONTRAST TECHNIQUE: Multidetector CT imaging of the head, cervical spine, and maxillofacial structures were performed using the standard protocol without intravenous contrast. Multiplanar CT image reconstructions of the cervical spine and maxillofacial structures were also generated. COMPARISON:  May 07, 2018. FINDINGS: CT HEAD FINDINGS Brain: No evidence of acute infarction, hemorrhage, hydrocephalus, extra-axial collection or mass lesion/mass effect. Vascular: No hyperdense vessel or unexpected calcification. Skull: Normal. Negative for fracture or focal lesion. Other: None. CT MAXILLOFACIAL FINDINGS Osseous: No fracture or mandibular dislocation. No destructive process. Orbits: Negative. No traumatic or inflammatory finding. Sinuses: Mucosal thickening throughout the ethmoid air cells, bilateral sphenoid sinuses, maxillary sinuses and frontal sinuses. Soft tissues: Soft tissue edema of the LEFT frontal scalp. CT CERVICAL SPINE FINDINGS Alignment: Normal. Skull base and vertebrae: No acute fracture. No primary bone lesion or focal pathologic process. Soft tissues and spinal canal: No prevertebral fluid or swelling. No visible canal hematoma. Disc levels: Intervertebral disc space height loss is most pronounced at C5-6 and C6-7. Posterior disc osteophyte complex at C6-7 and C5-6 without high-grade canal stenosis. Multilevel uncovertebral hypertrophy. Multilevel flowing anterior osteophytes. Upper chest: Remote RIGHT clavicular fracture. Other: Mild-to-moderate atherosclerotic calcifications of the RIGHT greater than LEFT carotid bulb. Partial visualization of ETT tube and enteric tube. IMPRESSION: 1.  No acute intracranial abnormality. 2. No acute facial bone fracture. 3. No acute fracture or subluxation of the cervical spine. 4. Soft tissue edema of the LEFT frontal scalp. Electronically Signed   By: Meda Klinefelter MD   On: 07/14/2020 14:47   CT  Cervical Spine Wo Contrast  Result Date: 07/14/2020 CLINICAL DATA:  Found down EXAM: CT HEAD WITHOUT CONTRAST CT MAXILLOFACIAL WITHOUT CONTRAST CT CERVICAL SPINE WITHOUT CONTRAST TECHNIQUE: Multidetector CT imaging of the head, cervical spine, and maxillofacial structures were performed using the standard protocol without intravenous contrast. Multiplanar CT image reconstructions of the cervical spine and maxillofacial structures were also generated. COMPARISON:  May 07, 2018. FINDINGS: CT HEAD FINDINGS Brain: No evidence of acute infarction, hemorrhage, hydrocephalus, extra-axial collection or mass lesion/mass effect. Vascular: No hyperdense vessel or unexpected calcification. Skull: Normal. Negative for fracture or focal lesion. Other: None. CT MAXILLOFACIAL FINDINGS Osseous: No fracture or mandibular dislocation. No destructive process. Orbits: Negative. No traumatic or inflammatory finding. Sinuses: Mucosal thickening throughout the ethmoid air cells, bilateral sphenoid sinuses, maxillary sinuses and frontal sinuses. Soft tissues: Soft tissue edema of  the LEFT frontal scalp. CT CERVICAL SPINE FINDINGS Alignment: Normal. Skull base and vertebrae: No acute fracture. No primary bone lesion or focal pathologic process. Soft tissues and spinal canal: No prevertebral fluid or swelling. No visible canal hematoma. Disc levels: Intervertebral disc space height loss is most pronounced at C5-6 and C6-7. Posterior disc osteophyte complex at C6-7 and C5-6 without high-grade canal stenosis. Multilevel uncovertebral hypertrophy. Multilevel flowing anterior osteophytes. Upper chest: Remote RIGHT clavicular fracture. Other: Mild-to-moderate atherosclerotic calcifications of the RIGHT greater than LEFT carotid bulb. Partial visualization of ETT tube and enteric tube. IMPRESSION: 1.  No acute intracranial abnormality. 2. No acute facial bone fracture. 3. No acute fracture or subluxation of the cervical spine. 4. Soft tissue  edema of the LEFT frontal scalp. Electronically Signed   By: Meda Klinefelter MD   On: 07/14/2020 14:47   DG Chest Port 1 View  Result Date: 07/15/2020 CLINICAL DATA:  Acute respiratory failure EXAM: PORTABLE CHEST 1 VIEW COMPARISON:  Yesterday FINDINGS: Endotracheal tube with tip at the clavicular heads. The enteric tube loops at the stomach. Lower lung volumes. No edema or focal infiltrate. No effusion or pneumothorax. Remote rib fractures. Normal heart size. IMPRESSION: Stable hardware positioning and symmetric aeration. Electronically Signed   By: Marnee Spring M.D.   On: 07/15/2020 04:52   DG Chest Portable 1 View  Result Date: 07/14/2020 CLINICAL DATA:  Hypoxia EXAM: PORTABLE CHEST 1 VIEW COMPARISON:  August 05, 2010 FINDINGS: Endotracheal tube tip is 5.6 cm above the carina. Nasogastric tube tip is below the diaphragm. No pneumothorax. No edema or airspace opacity. Heart size and pulmonary vascularity are normal. No adenopathy. Old healed rib fractures noted on each side with remodeling. IMPRESSION: Endotracheal tube and nasogastric tube as described. No pneumothorax. Lungs clear. Cardiac silhouette within normal limits. Electronically Signed   By: Bretta Bang III M.D.   On: 07/14/2020 14:14   ECHOCARDIOGRAM COMPLETE  Result Date: 07/15/2020    ECHOCARDIOGRAM REPORT   Patient Name:   Joseph Owen Date of Exam: 07/14/2020 Medical Rec #:  [id number]        Height:       72.0 in Accession #:    [id number]       Weight:       190.0 lb Date of Birth:  09-27-1951        BSA:          2.085 m Patient Age:    69 years         BP:           142/76 mmHg Patient Gender: M                HR:           66 bpm. Exam Location:  [facility] Procedure: 2D Echo, Cardiac Doppler, Color Doppler and Intracardiac            Opacification Agent Indications:     Acute respiratory failure  History:         Patient has no prior history of Echocardiogram examinations.  Sonographer:     Sedonia Small Rodgers-Jones Referring  Phys:  Erin Fulling Diagnosing Phys: Alwyn Pea MD  Sonographer Comments: Technically difficult study due to poor echo windows and echo performed with patient supine and on artificial respirator. IMPRESSIONS  1. Left ventricular ejection fraction, by estimation, is 50 to 55%. The left ventricle has low normal function. The left ventricle has no regional wall motion abnormalities. Left ventricular  diastolic parameters were normal.  2. Right ventricular systolic function is low normal. The right ventricular size is normal.  3. The mitral valve is normal in structure. No evidence of mitral valve regurgitation.  4. The aortic valve is normal in structure. Aortic valve regurgitation is not visualized. FINDINGS  Left Ventricle: Left ventricular ejection fraction, by estimation, is 50 to 55%. The left ventricle has low normal function. The left ventricle has no regional wall motion abnormalities. Definity contrast agent was given IV to delineate the left ventricular endocardial borders. The left ventricular internal cavity size was normal in size. There is no left ventricular hypertrophy. Left ventricular diastolic parameters were normal. Right Ventricle: The right ventricular size is normal. No increase in right ventricular wall thickness. Right ventricular systolic function is low normal. Left Atrium: Left atrial size was normal in size. Right Atrium: Right atrial size was normal in size. Pericardium: There is no evidence of pericardial effusion. Mitral Valve: The mitral valve is normal in structure. No evidence of mitral valve regurgitation. Tricuspid Valve: The tricuspid valve is normal in structure. Tricuspid valve regurgitation is not demonstrated. Aortic Valve: The aortic valve is normal in structure. Aortic valve regurgitation is not visualized. Pulmonic Valve: The pulmonic valve was normal in structure. Pulmonic valve regurgitation is not visualized. Aorta: Aortic root could not be assessed. IAS/Shunts: No  atrial level shunt detected by color flow Doppler.  LEFT VENTRICLE PLAX 2D LVIDd:         3.85 cm  Diastology LVIDs:         2.81 cm  LV e' medial:    3.26 cm/s LV PW:         0.93 cm  LV E/e' medial:  11.8 LV IVS:        0.88 cm  LV e' lateral:   4.35 cm/s LVOT diam:     2.10 cm  LV E/e' lateral: 8.8 LVOT Area:     3.46 cm  IVC IVC diam: 1.68 cm LEFT ATRIUM         Index LA diam:    3.60 cm 1.73 cm/m   AORTA Ao Root diam: 3.70 cm MITRAL VALVE MV Area (PHT): 3.03 cm    SHUNTS MV Decel Time: 250 msec    Systemic Diam: 2.10 cm MV E velocity: 38.40 cm/s MV A velocity: 48.00 cm/s MV E/A ratio:  0.80 Dwayne D Callwood MD Electronically signed by Alwyn Pea MD Signature Date/Time: 07/15/2020/5:44:14 PM    Final    CT Maxillofacial Wo Contrast  Result Date: 07/14/2020 CLINICAL DATA:  Found down EXAM: CT HEAD WITHOUT CONTRAST CT MAXILLOFACIAL WITHOUT CONTRAST CT CERVICAL SPINE WITHOUT CONTRAST TECHNIQUE: Multidetector CT imaging of the head, cervical spine, and maxillofacial structures were performed using the standard protocol without intravenous contrast. Multiplanar CT image reconstructions of the cervical spine and maxillofacial structures were also generated. COMPARISON:  May 07, 2018. FINDINGS: CT HEAD FINDINGS Brain: No evidence of acute infarction, hemorrhage, hydrocephalus, extra-axial collection or mass lesion/mass effect. Vascular: No hyperdense vessel or unexpected calcification. Skull: Normal. Negative for fracture or focal lesion. Other: None. CT MAXILLOFACIAL FINDINGS Osseous: No fracture or mandibular dislocation. No destructive process. Orbits: Negative. No traumatic or inflammatory finding. Sinuses: Mucosal thickening throughout the ethmoid air cells, bilateral sphenoid sinuses, maxillary sinuses and frontal sinuses. Soft tissues: Soft tissue edema of the LEFT frontal scalp. CT CERVICAL SPINE FINDINGS Alignment: Normal. Skull base and vertebrae: No acute fracture. No primary bone lesion or  focal pathologic process. Soft  tissues and spinal canal: No prevertebral fluid or swelling. No visible canal hematoma. Disc levels: Intervertebral disc space height loss is most pronounced at C5-6 and C6-7. Posterior disc osteophyte complex at C6-7 and C5-6 without high-grade canal stenosis. Multilevel uncovertebral hypertrophy. Multilevel flowing anterior osteophytes. Upper chest: Remote RIGHT clavicular fracture. Other: Mild-to-moderate atherosclerotic calcifications of the RIGHT greater than LEFT carotid bulb. Partial visualization of ETT tube and enteric tube. IMPRESSION: 1.  No acute intracranial abnormality. 2. No acute facial bone fracture. 3. No acute fracture or subluxation of the cervical spine. 4. Soft tissue edema of the LEFT frontal scalp. Electronically Signed   By: Meda Klinefelter MD   On: 07/14/2020 14:47     Assessment/Plan: 69 y.o. male  Admitted 2 days ago for acute hypoxic and hypercapnic respiratory failure.  As per family patient has been falling a lot recently.    - Pt is s/p extubation  - slow to respond but no clear focality - I suspect current condition is metabolic - from speaking with Dr. Tim Lair, family requested MRI due to multiple falls and progressive decline - there is possibility with [place] with hx of ETOH use and currently has tremors - Thiamine and folate ordered and started   07/16/2020, 10:37 AM

## 2020-07-16 NOTE — Progress Notes (Signed)
PROGRESS NOTE  PCCM transfer.  Joseph Owen  XLK:[id number] DOB: 01-02-1951 DOA: 07/14/2020 PCP: Jaclyn Shaggy, MD   Brief Narrative: Taken from H&P.  Patient is unable to provide much detail. Joseph Owen is a 69 y.o. male with a past history of chronic pain, depression chronic history of frequent fall and unsteadiness which has been worked up by neurologist in the past, and GERD(found to have some esophageal stenosis which was dilated in July 2021) presented to ED after being found unresponsive on the bathroom floor with the tub overflowing with water.  Patient lives alone and family last talked with him 24 hours ago.  Unable to obtained significant history.  On arrival he was significantly hypoxic and unresponsive requiring emergent intubation.  Extubated on 07/15/2020 and care was transitioned to [facility] on 07/16/2020.  EKG with sinus tachycardia but was without any significant ST changes.  On arrival labs are positive for leukocytosis with WBC count of 27.7 and neutrophils of 24.9, no obvious source of infection, may be secondary to stress with fall and being unresponsive, multiple electrolyte abnormalities which include hypokalemia, hypomagnesemia and hypophosphatemia.  AKI with creatinine of 1.5.  Baseline around 1.1-1.2.  Lactic acid of 4-never checked again.  No record of any troponin checked.  Urine and blood cultures negative.  Toxicology only positive for benzos in UDS.  CK elevated at 1100.  CT head and cervical spine was without any acute abnormality.  Echocardiogram with normal EF and no wall motion abnormalities.  Apparently patient has an history of severe depression and he was admitted at behavioral health with suicidal attempt over the summer.  Sepsis was noted in ED note but ruled out as there is no source of infection.  Subjective: Patient was very lethargic when seen today.  Does not even opening eyes but able to say yes or no questions.  Patient does not remember what  happened.  He was able to tell me his name, date of birth and that he is in hospital.  When asked about pain he said yes but unable to locate it.  Following some commands.  Assessment & Plan:   Active Problems:   Respiratory failure (HCC)  Acute hypoxic respiratory failure.  Unknown etiology.  Toxicology was pretty much negative except benzos in UDS.  Patient has an history of suicidal attempt secondary to severe depression and was admitted in behavioral health over the summer.  Not sure whether urine was obtained after he received medications for intubation. Patient is extubated now and was saturating well on 2 L.  Decreased responsiveness.  Patient still appears very lethargic and very slow to response.  On chart review he has an history of frequent falls and unsteadiness for many years and also being evaluated by a neurologist. Neurology was consulted and they started him on thiamine for some concern of Wernicke's. -EEG ordered-pending. -Continue to monitor. -Obtain swallow evaluation before starting any diet. -Check troponin. -Repeat lactic acid and CK level. -Can obtain an MRI brain with contrast for concern of Warnicke once renal function improves.  Sepsis ruled out as there is no obvious source of infection and leukocytosis resolved.  Electrolyte abnormalities.  Patient found to have multiple electrolyte abnormalities which includes hypokalemia, hypomagnesemia and hypophosphatemia. -Being managed by pharmacy. -Replete as needed and monitor.  Right upper extremity edema.  New onset right upper extremity edema.  Per nursing staff it was not there on admission. -We will check right upper extremity duplex to rule out DVT. -Can  also obtained humeral x-ray to rule out any osseous injury secondary to fall.  AKI with CKD stage IIIa.  Baseline creatinine around 1.2-1.3.  Seems improving. -Gentle IV hydration. -Continue to monitor. -Avoid nephrotoxins.  Depression. -Continue home dose  of Remeron once able to take p.o.  Objective: Vitals:   07/16/20 1100 07/16/20 1200 07/16/20 1300 07/16/20 1400  BP: 130/66 128/63 135/61 (!) 143/88  Pulse: 100 89 99   Resp: (!) Temp:  98.9 F (37.2 C)    TempSrc:  Axillary    SpO2: 98% 99% 100%   Weight:      Height:        Intake/Output Summary (Last 24 hours) at 07/16/2020 1443 Last data filed at 07/16/2020 1400 Gross per 24 hour  Intake 1566.08 ml  Output 600 ml  Net 966.08 ml   Filed Weights   07/14/20 1326 07/15/20 0500 07/16/20 0430  Weight: 86.2 kg 81.9 kg 81 kg    Examination:  General exam: Chronically ill-appearing, very lethargic gentleman, in no acute distress. Respiratory system: Clear to auscultation. Respiratory effort normal. Cardiovascular system: S1 & S2 heard, RRR. No JVD, murmurs, Gastrointestinal system: Soft, nontender, nondistended, bowel sounds positive. Central nervous system: Very lethargic, following some commands, appears nonfocal. Extremities: Right upper extremity edema, no cyanosis, pulses intact and symmetrical. Psychiatry: Judgement and insight appear impaired.  DVT prophylaxis: Lovenox Code Status: Full Family Communication: Brother Joseph Owen was updated on phone.  Patient lives alone, is divorced.  He was asking for an MRI of brain as he is having multiple falls and balance issues for many years now. Disposition Plan:  Status is: Inpatient  Remains inpatient appropriate because:Inpatient level of care appropriate due to severity of illness   Dispo: The patient is from: Home              Anticipated d/c is to: To be determined              Anticipated d/c date is: 3 days              Patient currently is not medically stable to d/c.  Consultants:   PCCM  Neurology  Procedures:  Antimicrobials:   Data Reviewed: I have personally reviewed following labs and imaging studies  CBC: Recent Labs  Lab 07/14/20 1341 07/15/20 0427 07/16/20 0726  WBC 27.7* 18.4* 18.6*    NEUTROABS 24.9*  --  16.6*  HGB 14.3 11.1* 11.1*  HCT 40.5 30.0* 31.9*  MCV 97.1 92.9 95.8  PLT 165 154 157   Basic Metabolic Panel: Recent Labs  Lab 07/14/20 1341 07/15/20 0427 07/16/20 0726  NA 137 138 136  K 2.9* 3.4* 3.1*  CL 96* 102 102  CO2 GLUCOSE 179* 73 114*  BUN 20 24* 24*  CREATININE 1.47* 1.57* 1.29*  CALCIUM 9.2 7.9* 8.0*  MG  --   --  1.3*  PHOS  --   --  2.1*   GFR: Estimated Creatinine Clearance: 58.5 mL/min (A) (by C-G formula based on SCr of 1.29 mg/dL (H)). Liver Function Tests: Recent Labs  Lab 07/14/20 1341  AST 62*  ALT 22  ALKPHOS 86  BILITOT 1.8*  PROT 7.2  ALBUMIN 3.8   No results for input(s): LIPASE, AMYLASE in the last 168 hours. No results for input(s): AMMONIA in the last 168 hours. Coagulation Profile: Recent Labs  Lab 07/14/20 1341  INR 1.1   Cardiac Enzymes: Recent Labs  Lab 07/14/20 1341  CKTOTAL 1,185*   BNP (last 3 results) No results for input(s): PROBNP in the last 8760 hours. HbA1C: No results for input(s): HGBA1C in the last 72 hours. CBG: Recent Labs  Lab 07/15/20 1935 07/16/20 0032 07/16/20 0321 07/16/20 0731 07/16/20 1111  GLUCAP 104* 103* 99 107* 100*   Lipid Profile: Recent Labs    07/14/20 1341  TRIG 82   Thyroid Function Tests: Recent Labs    07/14/20 1341  TSH 1.872  FREET4 1.07   Anemia Panel: No results for input(s): VITAMINB12, FOLATE, FERRITIN, TIBC, IRON, RETICCTPCT in the last 72 hours. Sepsis Labs: Recent Labs  Lab 07/14/20 1341 07/14/20 1547  LATICACIDVEN 4.0* 2.5*    Recent Results (from the past 240 hour(s))  Blood Culture (routine x 2)     Status: None (Preliminary result)   Collection Time: 07/14/20  1:41 PM   Specimen: BLOOD  Result Value Ref Range Status   Specimen Description BLOOD BLOOD RIGHT FOREARM  Final   Special Requests   Final    BOTTLES DRAWN AEROBIC AND ANAEROBIC Blood Culture adequate volume   Culture   Final    NO GROWTH 2  DAYS Performed at [facility], [address] [place], [place] [address]    Report Status PENDING  Incomplete  Blood Culture (routine x 2)     Status: None (Preliminary result)   Collection Time: 07/14/20  1:41 PM   Specimen: BLOOD  Result Value Ref Range Status   Specimen Description BLOOD BLOOD RIGHT HAND  Final   Special Requests   Final    BOTTLES DRAWN AEROBIC AND ANAEROBIC Blood Culture adequate volume   Culture   Final    NO GROWTH 2 DAYS Performed at [facility], [address] [place], [place] [address]    Report Status PENDING  Incomplete  Urine culture     Status: None   Collection Time: 07/14/20  1:41 PM   Specimen: In/Out Cath Urine  Result Value Ref Range Status   Specimen Description   Final    IN/OUT CATH URINE Performed at [facility], [address] [place], [place] [address]    Special Requests   Final    NONE Performed at [facility], [address] [place], [place] [address]    Culture   Final    NO GROWTH Performed at [facility], [address] [place], [place] [address]    Report Status 07/15/2020 FINAL  Final  SARS Coronavirus 2 by RT PCR (hospital order, performed in [facility] hospital lab) Nasopharyngeal Nasopharyngeal Swab     Status: None   Collection Time: 07/14/20  1:41 PM   Specimen: Nasopharyngeal Swab  Result Value Ref Range Status   SARS Coronavirus 2 NEGATIVE NEGATIVE Final    Comment: (NOTE) SARS-CoV-2 target nucleic acids are NOT DETECTED.  The SARS-CoV-2 RNA is generally detectable in upper and lower respiratory specimens during the acute phase of infection. The lowest concentration of SARS-CoV-2 viral copies this assay can detect is 250 copies / mL. A negative result does not preclude SARS-CoV-2 infection and should not be used as the sole basis for treatment or other patient management decisions.  A negative result may occur with improper specimen collection / handling,  submission of specimen other than nasopharyngeal swab, presence of viral mutation(s) within the areas targeted by this assay, and inadequate number of viral copies (<250 copies / mL). A negative result must be combined with clinical observations, patient  history, and epidemiological information.  Fact Sheet for Patients:   BoilerBrush.com.cy  Fact Sheet for Healthcare Providers: https://pope.com/  This test is not yet approved or  cleared by the [place] FDA and has been authorized for detection and/or diagnosis of SARS-CoV-2 by FDA under an Emergency Use Authorization (EUA).  This EUA will remain in effect (meaning this test can be used) for the duration of the COVID-19 declaration under Section 564(b)(1) of the Act, 21 U.S.C. section 360bbb-3(b)(1), unless the authorization is terminated or revoked sooner.  Performed at [facility], [address], [place], [place] [address]      Radiology Studies: DG Abd 1 View  Result Date: 07/14/2020 CLINICAL DATA:  Enteric catheter placement EXAM: ABDOMEN - 1 VIEW COMPARISON:  07/14/2020 at 2:01 p.m. FINDINGS: Frontal view of the lower chest and upper abdomen demonstrates enteric catheter tip and side port projecting over the gastric fundus. Bowel gas pattern is unremarkable. Indeterminate radiodensities projecting over left upper quadrant, may be on or beneath the patient. IMPRESSION: 1. Enteric catheter tip projecting over gastric fundus. 2. Unremarkable bowel gas pattern. Electronically Signed   By: Sharlet Salina M.D.   On: 07/14/2020 22:35   DG Chest Port 1 View  Result Date: 07/15/2020 CLINICAL DATA:  Acute respiratory failure EXAM: PORTABLE CHEST 1 VIEW COMPARISON:  Yesterday FINDINGS: Endotracheal tube with tip at the clavicular heads. The enteric tube loops at the stomach. Lower lung volumes. No edema or focal infiltrate. No effusion or pneumothorax. Remote rib fractures.  Normal heart size. IMPRESSION: Stable hardware positioning and symmetric aeration. Electronically Signed   By: Marnee Spring M.D.   On: 07/15/2020 04:52   ECHOCARDIOGRAM COMPLETE  Result Date: 07/15/2020    ECHOCARDIOGRAM REPORT   Patient Name:   KYSHAUN BARNETTE Date of Exam: 07/14/2020 Medical Rec #:  [id number]        Height:       72.0 in Accession #:    [id number]       Weight:       190.0 lb Date of Birth:  07-02-1951        BSA:          2.085 m Patient Age:    69 years         BP:           142/76 mmHg Patient Gender: M                HR:           66 bpm. Exam Location:  [facility] Procedure: 2D Echo, Cardiac Doppler, Color Doppler and Intracardiac            Opacification Agent Indications:     Acute respiratory failure  History:         Patient has no prior history of Echocardiogram examinations.  Sonographer:     Sedonia Small Rodgers-Jones Referring Phys:  Erin Fulling Diagnosing Phys: Alwyn Pea MD  Sonographer Comments: Technically difficult study due to poor echo windows and echo performed with patient supine and on artificial respirator. IMPRESSIONS  1. Left ventricular ejection fraction, by estimation, is 50 to 55%. The left ventricle has low normal function. The left ventricle has no regional wall motion abnormalities. Left ventricular diastolic parameters were normal.  2. Right ventricular systolic function is low normal. The right ventricular size is normal.  3. The mitral valve is normal in structure. No evidence of mitral valve regurgitation.  4. The aortic valve is normal  in structure. Aortic valve regurgitation is not visualized. FINDINGS  Left Ventricle: Left ventricular ejection fraction, by estimation, is 50 to 55%. The left ventricle has low normal function. The left ventricle has no regional wall motion abnormalities. Definity contrast agent was given IV to delineate the left ventricular endocardial borders. The left ventricular internal cavity size was normal in size. There is no left  ventricular hypertrophy. Left ventricular diastolic parameters were normal. Right Ventricle: The right ventricular size is normal. No increase in right ventricular wall thickness. Right ventricular systolic function is low normal. Left Atrium: Left atrial size was normal in size. Right Atrium: Right atrial size was normal in size. Pericardium: There is no evidence of pericardial effusion. Mitral Valve: The mitral valve is normal in structure. No evidence of mitral valve regurgitation. Tricuspid Valve: The tricuspid valve is normal in structure. Tricuspid valve regurgitation is not demonstrated. Aortic Valve: The aortic valve is normal in structure. Aortic valve regurgitation is not visualized. Pulmonic Valve: The pulmonic valve was normal in structure. Pulmonic valve regurgitation is not visualized. Aorta: Aortic root could not be assessed. IAS/Shunts: No atrial level shunt detected by color flow Doppler.  LEFT VENTRICLE PLAX 2D LVIDd:         3.85 cm  Diastology LVIDs:         2.81 cm  LV e' medial:    3.26 cm/s LV PW:         0.93 cm  LV E/e' medial:  11.8 LV IVS:        0.88 cm  LV e' lateral:   4.35 cm/s LVOT diam:     2.10 cm  LV E/e' lateral: 8.8 LVOT Area:     3.46 cm  IVC IVC diam: 1.68 cm LEFT ATRIUM         Index LA diam:    3.60 cm 1.73 cm/m   AORTA Ao Root diam: 3.70 cm MITRAL VALVE MV Area (PHT): 3.03 cm    SHUNTS MV Decel Time: 250 msec    Systemic Diam: 2.10 cm MV E velocity: 38.40 cm/s MV A velocity: 48.00 cm/s MV E/A ratio:  0.80 Dwayne D Callwood MD Electronically signed by Alwyn Pea MD Signature Date/Time: 07/15/2020/5:44:14 PM    Final     Scheduled Meds:  Chlorhexidine Gluconate Cloth  6 each Topical Daily   enoxaparin (LOVENOX) injection  40 mg Subcutaneous Q24H   mouth rinse  15 mL Mouth Rinse BID   nicotine  14 mg Transdermal Daily   sodium chloride flush  3 mL Intravenous Q12H   thiamine injection  100 mg Intravenous Daily   Continuous Infusions:  sodium chloride      dextrose 20 mL/hr at 07/16/20 1400   famotidine (PEPCID) IV Stopped (07/16/20 0904)   potassium PHOSPHATE IVPB (in mmol) 85 mL/hr at 07/16/20 1400   vancomycin       LOS: 2 days   Time spent: 45 minutes.  Arnetha Courser, MD [facility]  If 7PM-7AM, please contact night-coverage [email or web address]  07/16/2020, 2:43 PM   This record has been created using Conservation officer, historic buildings. Errors have been sought and corrected,but may not always be located. Such creation errors do not reflect on the standard of care.

## 2020-07-16 NOTE — Progress Notes (Signed)
PT Cancellation Note  Patient Details Name: Joseph Owen MRN: [id number] DOB: 07-09-1951   Cancelled Treatment:    Reason Eval/Treat Not Completed: Fatigue/lethargy limiting ability to participate (Chart reviewed, RN consulted. Attempted evaluation, but pt too lethargic, to participate in a meaningful manner.)  9:54 AM, 07/16/20 Rosamaria Lints, PT, DPT Physical Therapist - [facility]  [phone] (ASCOM)   Zamari Bonsall C 07/16/2020, 9:53 AM

## 2020-07-16 NOTE — Progress Notes (Signed)
eeg done °

## 2020-07-17 ENCOUNTER — Inpatient Hospital Stay: Payer: Medicare Other

## 2020-07-17 DIAGNOSIS — R404 Transient alteration of awareness: Secondary | ICD-10-CM | POA: Diagnosis not present

## 2020-07-17 DIAGNOSIS — R402432 Glasgow coma scale score 3-8, at arrival to emergency department: Secondary | ICD-10-CM | POA: Diagnosis not present

## 2020-07-17 DIAGNOSIS — J9601 Acute respiratory failure with hypoxia: Secondary | ICD-10-CM | POA: Diagnosis not present

## 2020-07-17 DIAGNOSIS — M7989 Other specified soft tissue disorders: Secondary | ICD-10-CM | POA: Diagnosis not present

## 2020-07-17 DIAGNOSIS — N179 Acute kidney failure, unspecified: Secondary | ICD-10-CM | POA: Diagnosis not present

## 2020-07-17 LAB — CSF CELL COUNT WITH DIFFERENTIAL
Eosinophils, CSF: 0 %
Lymphs, CSF: 43 %
Monocyte-Macrophage-Spinal Fluid: 53 %
RBC Count, CSF: 25 /mm3 — ABNORMAL HIGH (ref 0–3)
Segmented Neutrophils-CSF: 4 %
Tube #: 3
WBC, CSF: 7 /mm3 — ABNORMAL HIGH (ref 0–5)

## 2020-07-17 LAB — BASIC METABOLIC PANEL WITH GFR
Anion gap: 8 (ref 5–15)
BUN: 15 mg/dL (ref 8–23)
CO2: 26 mmol/L (ref 22–32)
Calcium: 8.2 mg/dL — ABNORMAL LOW (ref 8.9–10.3)
Chloride: 106 mmol/L (ref 98–111)
Creatinine, Ser: 0.98 mg/dL (ref 0.61–1.24)
GFR calc Af Amer: >60 mL/min
GFR calc non Af Amer: >60 mL/min
Glucose, Bld: 129 mg/dL — ABNORMAL HIGH (ref 70–99)
Potassium: 3.8 mmol/L (ref 3.5–5.1)
Sodium: 140 mmol/L (ref 135–145)

## 2020-07-17 LAB — GLUCOSE, CSF: Glucose, CSF: 70 mg/dL (ref 40–70)

## 2020-07-17 LAB — GLUCOSE, CAPILLARY
Glucose-Capillary: 125 mg/dL — ABNORMAL HIGH (ref 70–99)
Glucose-Capillary: 111 mg/dL — ABNORMAL HIGH (ref 70–99)
Glucose-Capillary: 98 mg/dL (ref 70–99)
Glucose-Capillary: 104 mg/dL — ABNORMAL HIGH (ref 70–99)

## 2020-07-17 LAB — AMMONIA: Ammonia: 11 umol/L (ref 9–35)

## 2020-07-17 LAB — PHOSPHORUS: Phosphorus: 1.7 mg/dL — ABNORMAL LOW (ref 2.5–4.6)

## 2020-07-17 LAB — VITAMIN B12: Vitamin B-12: 804 pg/mL (ref 180–914)

## 2020-07-17 LAB — MAGNESIUM: Magnesium: 2.5 mg/dL — ABNORMAL HIGH (ref 1.7–2.4)

## 2020-07-17 LAB — PROTEIN, CSF: Total  Protein, CSF: 54 mg/dL — ABNORMAL HIGH (ref 15–45)

## 2020-07-17 MED ORDER — POTASSIUM PHOSPHATES 15 MMOLE/5ML IV SOLN
30.0000 mmol | Freq: Once | INTRAVENOUS | Status: AC
  Administered 2020-07-17: 30 mmol via INTRAVENOUS
  Filled 2020-07-17 (×2): qty 10

## 2020-07-17 MED ORDER — FOLIC ACID 5 MG/ML IJ SOLN
1.0000 mg | Freq: Every day | INTRAMUSCULAR | Status: DC
  Administered 2020-07-17 – 2020-07-21 (×5): 1 mg via INTRAVENOUS
  Filled 2020-07-17 (×5): qty 0.2

## 2020-07-17 NOTE — Progress Notes (Signed)
PT Cancellation Note  Patient Details Name: Joseph Owen MRN: [id number] DOB: May 29, 1951   Cancelled Treatment:    Reason Eval/Treat Not Completed: Fatigue/lethargy limiting ability to participate. Pt with some mumbling speech, only understandable statements heard after PT introduced herself he said "go away", and when asked if he was having pain he said "all over". Unable to follow commands at this time. PT to re-attempt as able.   Olga Coaster PT, DPT 9:29 AM,07/17/20

## 2020-07-17 NOTE — Consult Note (Addendum)
Subjective: still confused and occasionally following commands    History reviewed. No pertinent past medical history.  History reviewed. No pertinent surgical history.  No family history on file.  Social History:  reports that he has quit smoking. He has never used smokeless tobacco. No history on file for alcohol use and drug use.  Not on File  Medications: I have reviewed the patient's current medications.  ROS: Unable to obtain as slow to follow commands.    Physical Examination: Blood pressure 118/64, pulse 77, temperature 98.9 F (37.2 C), temperature source Axillary, resp. rate 18, height 6' 0.01" (1.829 m), weight 81 kg, SpO2 96 %.    Neurological Examination   Mental Status: Alert to name slow to respon Cranial Nerves: III,IV, VI: ptosis not present, extra-ocular motions intact bilaterally V,VII: smile symmetric, facial light touch sensation normal bilaterally VIII: hearing normal bilaterally Motor: Generalized weakness but no clear focality on examination Sensory: withdraws from pain   Laboratory Studies:   Basic Metabolic Panel: Recent Labs  Lab 07/14/20 1341 07/14/20 1341 07/15/20 0427 07/16/20 0726 07/17/20 0551  NA 137  --  138 136 140  K 2.9*  --  3.4* 3.1* 3.8  CL 96*  --  102 102 106  CO2 23  --  22 26 26   GLUCOSE 179*  --  73 114* 129*  BUN 20  --  24* 24* 15  CREATININE 1.47*  --  1.57* 1.29* 0.98  CALCIUM 9.2   < > 7.9* 8.0* 8.2*  MG  --   --   --  1.3* 2.5*  PHOS  --   --   --  2.1* 1.7*   < > = values in this interval not displayed.    Liver Function Tests: Recent Labs  Lab 07/14/20 1341  AST 62*  ALT 22  ALKPHOS 86  BILITOT 1.8*  PROT 7.2  ALBUMIN 3.8   No results for input(s): LIPASE, AMYLASE in the last 168 hours. No results for input(s): AMMONIA in the last 168 hours.  CBC: Recent Labs  Lab 07/14/20 1341 07/15/20 0427 07/16/20 0726  WBC 27.7* 18.4* 18.6*  NEUTROABS 24.9*  --  16.6*  HGB 14.3 11.1* 11.1*  HCT  40.5 30.0* 31.9*  MCV 97.1 92.9 95.8  PLT 165 154 157    Cardiac Enzymes: Recent Labs  Lab 07/14/20 1341 07/16/20 1609  CKTOTAL 1,185* 693*    BNP: Invalid input(s): POCBNP  CBG: Recent Labs  Lab 07/16/20 1111 07/16/20 1622 07/16/20 1920 07/16/20 2354 07/17/20 0751  GLUCAP 100* 128* 128* 115* 125*    Microbiology: Results for orders placed or performed during the hospital encounter of 07/14/20  Blood Culture (routine x 2)     Status: None (Preliminary result)   Collection Time: 07/14/20  1:41 PM   Specimen: BLOOD  Result Value Ref Range Status   Specimen Description BLOOD BLOOD RIGHT FOREARM  Final   Special Requests   Final    BOTTLES DRAWN AEROBIC AND ANAEROBIC Blood Culture adequate volume   Culture   Final    NO GROWTH 3 DAYS Performed at [facility], [address] [place], [place]    Report Status PENDING  Incomplete  Blood Culture (routine x 2)     Status: None (Preliminary result)   Collection Time: 07/14/20  1:41 PM   Specimen: BLOOD  Result Value Ref Range Status   Specimen Description BLOOD BLOOD RIGHT HAND  Final   Special Requests   Final  BOTTLES DRAWN AEROBIC AND ANAEROBIC Blood Culture adequate volume   Culture   Final    NO GROWTH 3 DAYS Performed at [facility], [address] [place], [place] [address]    Report Status PENDING  Incomplete  Urine culture     Status: None   Collection Time: 07/14/20  1:41 PM   Specimen: In/Out Cath Urine  Result Value Ref Range Status   Specimen Description   Final    IN/OUT CATH URINE Performed at [facility], [address] [place], [place] [address]    Special Requests   Final    NONE Performed at [facility], [address], [place], [place] [address]    Culture   Final    NO GROWTH Performed at [facility], [address], [place], [place] [address]    Report Status 07/15/2020 FINAL  Final  SARS Coronavirus 2 by RT PCR  (hospital order, performed in [facility] hospital lab) Nasopharyngeal Nasopharyngeal Swab     Status: None   Collection Time: 07/14/20  1:41 PM   Specimen: Nasopharyngeal Swab  Result Value Ref Range Status   SARS Coronavirus 2 NEGATIVE NEGATIVE Final    Comment: (NOTE) SARS-CoV-2 target nucleic acids are NOT DETECTED.  The SARS-CoV-2 RNA is generally detectable in upper and lower respiratory specimens during the acute phase of infection. The lowest concentration of SARS-CoV-2 viral copies this assay can detect is 250 copies / mL. A negative result does not preclude SARS-CoV-2 infection and should not be used as the sole basis for treatment or other patient management decisions.  A negative result may occur with improper specimen collection / handling, submission of specimen other than nasopharyngeal swab, presence of viral mutation(s) within the areas targeted by this assay, and inadequate number of viral copies (<250 copies / mL). A negative result must be combined with clinical observations, patient history, and epidemiological information.  Fact Sheet for Patients:   BoilerBrush.com.cy  Fact Sheet for Healthcare Providers: https://pope.com/  This test is not yet approved or  cleared by the [place] FDA and has been authorized for detection and/or diagnosis of SARS-CoV-2 by FDA under an Emergency Use Authorization (EUA).  This EUA will remain in effect (meaning this test can be used) for the duration of the COVID-19 declaration under Section 564(b)(1) of the Act, 21 U.S.C. section 360bbb-3(b)(1), unless the authorization is terminated or revoked sooner.  Performed at [facility], [address] [place], [place] [address]     Coagulation Studies: Recent Labs    07/14/20 1341  LABPROT 14.0  INR 1.1    Urinalysis:  Recent Labs  Lab 07/14/20 1341  COLORURINE AMBER*  LABSPEC 1.015  PHURINE 5.0   GLUCOSEU NEGATIVE  HGBUR SMALL*  BILIRUBINUR NEGATIVE  KETONESUR 5*  PROTEINUR NEGATIVE  NITRITE NEGATIVE  LEUKOCYTESUR NEGATIVE    Lipid Panel:     Component Value Date/Time   TRIG 82 07/14/2020 1341    HgbA1C: No results found for: HGBA1C  Urine Drug Screen:      Component Value Date/Time   LABOPIA NONE DETECTED 07/14/2020 1341   COCAINSCRNUR NONE DETECTED 07/14/2020 1341   LABBENZ POSITIVE (A) 07/14/2020 1341   AMPHETMU NONE DETECTED 07/14/2020 1341   THCU NONE DETECTED 07/14/2020 1341   LABBARB NONE DETECTED 07/14/2020 1341    Alcohol Level:  Recent Labs  Lab 07/14/20 1341  ETH <10    Other results: EKG: normal EKG, normal sinus rhythm, unchanged from  previous tracings.  Imaging: EEG  Result Date: 07/16/2020 Charlsie Quest, MD     07/16/2020  3:37 PM Patient Name: Joseph Owen MRN: [id number] Epilepsy Attending: Charlsie Quest Referring Physician/Provider: Dr Arnetha Courser Date: 07/16/2020 Duration: 28.11 mins Patient history: 69 y.o. male admitted 2 days ago for acute hypoxic and hypercapnic respiratory failure, continues to have decreased responsiveness.EE to evaluate for seizure Level of alertness:  lethargic AEDs during EEG study: Gabapentin Technical aspects: This EEG study was done with scalp electrodes positioned according to the 10-20 International system of electrode placement. Electrical activity was acquired at a sampling rate of 500Hz  and reviewed with a high frequency filter of 70Hz  and a low frequency filter of 1Hz . EEG data were recorded continuously and digitally stored. Description: No posterior dominant rhythm was seen. EEG showed continuous generalized polymorphic3 to 6 Hz theta-delta slowing. Hyperventilation and photic stimulation were not performed.   ABNORMALITY -Continued slow, generalized IMPRESSION: This study is suggestive of severe diffuse encephalopathy, nonspecific etiology. No seizures or epileptiform discharges were seen throughout  the recording. Priyanka   Venous Img Upper Uni Right(DVT)  Result Date: 07/16/2020 CLINICAL DATA:  Right upper extremity edema. Patient is currently on anticoagulation. Evaluate for DVT. EXAM: RIGHT UPPER EXTREMITY VENOUS DOPPLER ULTRASOUND TECHNIQUE: Gray-scale sonography with graded compression, as well as color Doppler and duplex ultrasound were performed to evaluate the upper extremity deep venous system from the level of the subclavian vein and including the jugular, axillary, basilic, radial, ulnar and upper cephalic vein. Spectral Doppler was utilized to evaluate flow at rest and with distal augmentation maneuvers. COMPARISON:  None. FINDINGS: Contralateral Subclavian Vein: Respiratory phasicity is normal and symmetric with the symptomatic side. No evidence of thrombus. Normal compressibility. Internal Jugular Vein: No evidence of thrombus. Normal compressibility, respiratory phasicity and response to augmentation. Subclavian Vein: No evidence of thrombus. Normal compressibility, respiratory phasicity and response to augmentation. Axillary Vein: No evidence of thrombus. Normal compressibility, respiratory phasicity and response to augmentation. Cephalic Vein: No evidence of thrombus. Normal compressibility, respiratory phasicity and response to augmentation. Basilic Vein: There is age-indeterminate mixed echogenic expansile occlusive thrombus within the right basilic vein (images 25, 26 and 27). Brachial Veins: No evidence of thrombus. Normal compressibility, respiratory phasicity and response to augmentation. Radial Veins: No evidence of thrombus. Normal compressibility, respiratory phasicity and response to augmentation. Ulnar Veins: No evidence of thrombus. Normal compressibility, respiratory phasicity and response to augmentation. Venous Reflux:  None visualized. Other Findings:  None visualized. IMPRESSION: 1. No evidence of DVT within the right upper extremity. 2. Examination is positive  for age-indeterminate occlusive superficial thrombophlebitis involving the right basilic vein. There is no extension of this age-indeterminate, potentially chronic, occlusive SVT to the venous system of the right upper extremity. Electronically Signed   By: Annabelle Harman M.D.   On: 07/16/2020 16:26     Assessment/Plan: 69 y.o. male  Admitted 2 days ago for acute hypoxic and hypercapnic respiratory failure.  As per family patient has been falling a lot recently.    - Pt is s/p extubation  - slow to respond but not clear focality. Mumbling when trying to speak with him  - I suspect current condition is metabolic related - Thiamine and folate started - Will obtain MRI without contrast - EEG done with diffuse slowing  Addendum: MRI reviewed and no acute abnormality seen    07/17/2020, 9:43 AM

## 2020-07-17 NOTE — Progress Notes (Signed)
Pt transferred to PCU rm 244.  Report called to Jamie  VSS on 3 liters [place].  Pt transported on PCU bed and off unit tele applied.

## 2020-07-17 NOTE — Progress Notes (Signed)
PHARMACY CONSULT NOTE - FOLLOW UP  Pharmacy Consult for Electrolyte Monitoring and Replacement   Recent Labs: Potassium (mmol/L)  Date Value  07/17/2020 3.8   Magnesium (mg/dL)  Date Value  76/16/0737 2.5 (H)   Calcium (mg/dL)  Date Value  10/62/6948 8.2 (L)   Albumin (g/dL)  Date Value  54/62/7035 3.8   Phosphorus (mg/dL)  Date Value  00/93/8182 1.7 (L)   Sodium (mmol/L)  Date Value  07/17/2020 140     Assessment: 69 year old male admitted for respiratory failure requiring intubation. Patient now extubated, remains somnolent and NPO as a result. Patient with electrolyte abnormalities, pharmacy consulted to replace.  Goal of Therapy:  Electrolytes WNL  Plan:  Potassium phos 30 mmol IV x 1 after drop in phos despite replacement 9/23. Will follow up all electrolytes with morning labs.  Pricilla Riffle ,PharmD Clinical Pharmacist 07/17/2020 12:23 PM

## 2020-07-17 NOTE — Evaluation (Signed)
Occupational Therapy Evaluation Patient Details Name: Joseph Owen MRN: [id number] DOB: 02/15/1951 Today's Date: 07/17/2020    History of Present Illness [name] is a 69 y/o M with PMH: chronic pain, GERD and L foot drop. Pt presented to ED via EMS d/t being found unresponsive in bathroom with water overflowing after family called EMS for wellness check. Pt was hypothermic with temp of 86.3 and required NRB for oxygen. Pt adm for acute hypoxic and hypercapnic respiratory failure. Pt emergently intubated 9/21, extubated 9/22. Acute w/u includes: CT head and cervical spine was without any acute abnormality, Echo with normal EF, EKG with sinus tachycardia but was without any significant ST changes, EEG suggestive of severe diffuse encephalopathy (nonspecific etiology), no seizures or epileptiform discharges, Toxicology only positive for benzos in UDS. Pt to go for MRI 9/24. Of note: family reports pt has been falling a lot lately and attending's note includes: "patient has an history of severe depression and he was admitted at behavioral health with suicidal attempt over the summer".   Clinical Impression   Pt was seen for OT evaluation this date. Prior to hospital admission, pt was Indep with self care ADLs and used wooden walking staff for fxl mobility. Pt lives alone in [facility] with ramped entrance. Pt's family reports that pt has been falling frequently lately (brother estimates 1x/wk) and indicates that home setup is somewhat precarious and that his ramp is slippery with moss. Currently assessed in ICU by OT. He is somewhat difficult to rouse, but does make effort to participate with MAX tactile/auditory stimuli. Pt demonstrates impairments as described below (See OT problem list) which functionally limit his ability to perform ADL/self-care tasks. Pt currently requires MAX A with hand over hand and MAX verbal/tactile cues to participate in bed level UB self care/grooming with HOB elevated.  OT  engages pt in gentle PROM of UEs as tolerable and pt progresses to [facility], making an effort to mobilize UEs with therapist's cues. Extended time required in all aspects of assessment, self care participation and ROM. OT positions pt's UEs elevated on pillows at end of session and transport presents to take to MRI. Pt would benefit from skilled OT services to address noted impairments and functional limitations (see below for any additional details) in order to maximize safety and independence while minimizing falls risk and caregiver burden. Upon hospital discharge, recommend STR to maximize pt safety and return to PLOF.     Follow Up Recommendations  SNF    Equipment Recommendations  Other (comment) (TBD with progress, likely defer to next setting)    Recommendations for Other Services       Precautions / Restrictions Precautions Precautions: Fall Restrictions Weight Bearing Restrictions: No      Mobility Bed Mobility               General bed mobility comments: NT, unsafe/unable  Transfers                 General transfer comment: NT    Balance                                           ADL either performed or assessed with clinical judgement   ADL Overall ADL's : Needs assistance/impaired     Grooming: Maximal assistance;Bed level;Wash/dry face;Oral care;Cueing for sequencing Grooming Details (indicate cue type and reason): MAX A hand over  hand to encourage minimal participation in oral care and washing face.                               General ADL Comments: TOTAL A in all other aspects of self care. Only Joseph to engage pt in bed level UB ADLs with HOB elevated and hand over hand. Not appropriate to attempt to sit up at this time as he is still largely obtunded.     Vision Baseline Vision/History: Wears glasses Wears Glasses: At all times Additional Comments: unable to formally assess at this time d/t cognition      Perception     Praxis      Pertinent Vitals/Pain Pain Assessment: Faces Faces Pain Scale: Hurts little more Pain Location: when performing gentle AAROM of UEs, pt does not grimmace or c/o pain, but does say 'it hurts, it's hard" when OT encourages him to perform AROM himself. Pain Descriptors / Indicators: Tender Pain Intervention(s): Limited activity within patient's tolerance;Monitored during session;Repositioned (elevated UEs on pillows)     Hand Dominance Right   Extremity/Trunk Assessment Upper Extremity Assessment Upper Extremity Assessment: Generalized weakness;Difficult to assess due to impaired cognition   Lower Extremity Assessment Lower Extremity Assessment: Generalized weakness;Difficult to assess due to impaired cognition       Communication Communication Communication: Other (comment) (garbled/mumbles)   Cognition Arousal/Alertness: Lethargic Behavior During Therapy: WFL for tasks assessed/performed;Flat affect Overall Cognitive Status: Difficult to assess                                 General Comments: Pt is Joseph to tell me his name and birthday and when asked what he prefers to be called, states "Joseph Owen". He is otherwise disoriented. Pt with minimal eye opening during session and requires MOD/MAX auditory/tactiule stimuli to minimally attend. Pt is Joseph to follow ~20% of simple one step commands when given increased processing time and tactile cues. Gradually becomes more awake/participatory as session progresses. Asks for his glasses and cellphone.   General Comments  NT    Exercises Other Exercises Other Exercises: OT facilitates pt participation in bed level UB grooming with HOB elevated with MAX tactile/MOD verbal cues and MAX A with progress to MOD A hand over hand. Pt makes solid effort after several attempts to stimulate him to attend to a task. Other Exercises: OT engags pt in participation in PROM to [facility] with UEs in all availabl  planes at bed level. Pt tolerates shoulders to 3/4 range, elbows full range, and Joseph to more actively contribute with wrists/digits when cued.   Shoulder Instructions      Home Living Family/patient expects to be discharged to:: Private residence Living Arrangements: Alone Available Help at Discharge: Other (Comment) (pt's brother reports that pt has a daughter in [place] and the family has been trying to move him closer to her. Elijah Birk states pt is estranged from his ex-wife, and doesn't have great friend/family support in [place] area. (Brother lives in [place]).) Type of Home: House Home Access: Ramped entrance (brother reports ramp is old and covered in moss and slippery-not safe)     Home Layout: One level     Bathroom Shower/Tub: Tub/shower unit (brother reports pt's tub is old fashioned and a shower head is out of the wall over the tub.)   Bathroom Toilet: Standard Bathroom Accessibility: No   Home Equipment: Environmental consultant -  2 wheels;Other (comment) ("walking staff")   Additional Comments: Pt's brother reports that pt does not have a stove, has a microwave or gets takeout. P      Prior Functioning/Environment Level of Independence: Independent with assistive device(s)        Comments: Pt is questionable historian and speech is largely garbled. Most information collected from brother, Elijah Birk via telephone. Elijah Birk reports that pt drives only short local distances to get groceries and to doctor's appts. States that pt's house is full of trash and items/hard to get around. Also reports that has difficulty getting in and out of his tub and has fallen several times. States that pt has a walker that his brother gave him, but does not want to use despite falls and instead uses walking stick/wooden staff.        OT Problem List: Decreased strength;Decreased range of motion;Decreased activity tolerance;Impaired balance (sitting and/or standing);Decreased cognition;Decreased safety awareness;Decreased  knowledge of use of DME or AE;Decreased knowledge of precautions;Cardiopulmonary status limiting activity;Increased edema      OT Treatment/Interventions: Self-care/ADL training;DME and/or AE instruction;Therapeutic activities;Balance training;Therapeutic exercise;Energy conservation;Cognitive remediation/compensation;Patient/family education    OT Goals(Current goals can be found in the care plan section) Acute Rehab OT Goals OT Goal Formulation: Patient unable to participate in goal setting Time For Goal Achievement: 07/31/20 Potential to Achieve Goals: Fair  OT Frequency: Min 1X/week   Barriers to D/C:            Co-evaluation              AM-PAC OT "6 Clicks" Daily Activity     Outcome Measure Help from another person eating meals?: A Lot Help from another person taking care of personal grooming?: A Lot Help from another person toileting, which includes using toliet, bedpan, or urinal?: Total Help from another person bathing (including washing, rinsing, drying)?: Total Help from another person to put on and taking off regular upper body clothing?: Total Help from another person to put on and taking off regular lower body clothing?: Total 6 Click Score: 8   End of Session Nurse Communication: Other (comment) (general status/participation with OT)  Activity Tolerance: Patient limited by lethargy Patient left: in bed;with call bell/phone within reach;Other (comment) (leaving with transport to MRI, mitts re-applied)  OT Visit Diagnosis: Muscle weakness (generalized) (M62.81);Other symptoms and signs involving cognitive function                Time: 0932-1006 OT Time Calculation (min): 34 min Charges:  OT General Charges $OT Visit: 1 Visit OT Evaluation $OT Eval Moderate Complexity: 1 Mod OT Treatments $Self Care/Home Management : 8-22 mins  Rejeana Brock, MS, OTR/L ascom [phone] 07/17/20, 11:37 AM

## 2020-07-17 NOTE — Progress Notes (Signed)
PROGRESS NOTE  PCCM transfer.  Joseph Owen  ZOX:[id number] DOB: 09/29/1951 DOA: 07/14/2020 PCP: Jaclyn Shaggy, MD   Brief Narrative: Taken from H&P.  Patient is unable to provide much detail. Joseph Owen is a 69 y.o. male with a past history of chronic pain, depression chronic history of frequent fall and unsteadiness which has been worked up by neurologist in the past, and GERD(found to have some esophageal stenosis which was dilated in July 2021) presented to ED after being found unresponsive on the bathroom floor with the tub overflowing with water.  Patient lives alone and family last talked with him 24 hours ago.  Unable to obtained significant history.  On arrival he was significantly hypoxic and unresponsive requiring emergent intubation.  Extubated on 07/15/2020 and care was transitioned to [facility] on 07/16/2020.  EKG with sinus tachycardia but was without any significant ST changes.  On arrival labs are positive for leukocytosis with WBC count of 27.7 and neutrophils of 24.9, no obvious source of infection, may be secondary to stress with fall and being unresponsive, multiple electrolyte abnormalities which include hypokalemia, hypomagnesemia and hypophosphatemia.  AKI with creatinine of 1.5.  Baseline around 1.1-1.2.  Lactic acid of 4-never checked again.  No record of any troponin checked.  Urine and blood cultures negative.  Toxicology only positive for benzos in UDS.  CK elevated at 1100.  CT head and cervical spine was without any acute abnormality.  Echocardiogram with normal EF and no wall motion abnormalities.  Apparently patient has an history of severe depression and he was admitted at behavioral health with suicidal attempt over the summer.  Sepsis was noted in ED note but ruled out as there is no source of infection.  Subjective: Patient remained very lethargic.  Just mumbling some words which I could not understand.  Able to follow some simple commands like raising your  hand.  Says yes about pain but unable to localize it.  Patient become febrile overnight.  Assessment & Plan:   Active Problems:   Respiratory failure (HCC)   Glasgow coma scale total score 3-8 (HCC)   AKI (acute kidney injury) (HCC)   Swelling of arm  Acute hypoxic respiratory failure.  Unknown etiology.  Toxicology was pretty much negative except benzos in UDS.  Patient has an history of suicidal attempt secondary to severe depression and was admitted in behavioral health over the summer.  Not sure whether urine was obtained after he received medications for intubation. Patient is extubated now and was saturating well on 2 L.  Decreased responsiveness/encephalopathy.  Patient still appears very lethargic and very slow to response.  On chart review he has an history of frequent falls and unsteadiness for many years and also being evaluated by a neurologist. Neurology was consulted and they started him on thiamine for some concern of Wernicke's.  MRI was done today which was negative for any acute abnormality. EEG -shows diffuse encephalopathy. Patient becomes febrile with a temperature of 100.3 this morning.  Urine and blood cultures remain negative.  Repeat chest x-ray obtained this morning was without any acute abnormality. Troponin was positive at 60 and decreased to 55 on repeat check.  Consistent with demand only.  No lactic acidosis -Will get LP to evaluate CSF. -Continue to monitor. -Obtain swallow evaluation before starting any diet. -I will hold off to antibiotics as fever resolved without any intervention, would like to get CSF sample before intervening.  Sepsis ruled out as there is no obvious source  of infection and leukocytosis resolved.  Elevated troponin.  EKG without any acute changes.  Barely positive troponin most likely secondary to demand with a flat curve.  Electrolyte abnormalities.  Patient found to have multiple electrolyte abnormalities which includes hypokalemia,  hypomagnesemia and hypophosphatemia. -Being managed by pharmacy. -Replete as needed and monitor.  Right upper extremity edema.  Venous Doppler obtained yesterday for the concern of right upper extremity edema which was negative for DVT.  AKI with CKD stage IIIa.  Resolved with IV hydration.  Creatinine below 1 today. -Gentle IV hydration. -Continue to monitor. -Avoid nephrotoxins.  Depression. -Continue home dose of Remeron once able to take p.o.  Objective: Vitals:   07/17/20 0700 07/17/20 0800 07/17/20 0900 07/17/20 1139  BP: (!) 121/94 127/65 118/64 (!) 142/73  Pulse: 82 80 77 90  Resp: 17 18 18 18   Temp:  100.2 F (37.9 C)  98.9 F (37.2 C)  TempSrc:  Axillary  Oral  SpO2: 97% 97% 96% 100%  Weight:      Height:        Intake/Output Summary (Last 24 hours) at 07/17/2020 1414 Last data filed at 07/17/2020 0800 Gross per 24 hour  Intake 1772.18 ml  Output 750 ml  Net 1022.18 ml   Filed Weights   07/15/20 0500 07/16/20 0430 07/17/20 0414  Weight: 81.9 kg 81 kg 81 kg    Examination:  General.  Chronically ill-appearing, very lethargic gentleman, just mumbles few words, in no acute distress. Pulmonary.  Lungs clear bilaterally, normal respiratory effort. CV.  Regular rate and rhythm, no JVD, rub or murmur. Abdomen.  Soft, nontender, nondistended, BS positive. CNS.  Very lethargic but following some simple commands, appears nonfocal. Extremities. RUE edema, no cyanosis, pulses intact and symmetrical. Psychiatry.  Judgment and insight appears impaired.  DVT prophylaxis: Lovenox Code Status: Full Family Communication: Called Brother Tom with no response.  Patient lives alone, is divorced.   Disposition Plan:  Status is: Inpatient  Remains inpatient appropriate because:Inpatient level of care appropriate due to severity of illness   Dispo: The patient is from: Home              Anticipated d/c is to: To be determined              Anticipated d/c date is: 3  days              Patient currently is not medically stable to d/c.  Consultants:   PCCM  Neurology  Procedures:  Antimicrobials:   Data Reviewed: I have personally reviewed following labs and imaging studies  CBC: Recent Labs  Lab 07/14/20 1341 07/15/20 0427 07/16/20 0726  WBC 27.7* 18.4* 18.6*  NEUTROABS 24.9*  --  16.6*  HGB 14.3 11.1* 11.1*  HCT 40.5 30.0* 31.9*  MCV 97.1 92.9 95.8  PLT 165 154 157   Basic Metabolic Panel: Recent Labs  Lab 07/14/20 1341 07/15/20 0427 07/16/20 0726 07/17/20 0551  NA 137 138 136 140  K 2.9* 3.4* 3.1* 3.8  CL 96* 102 102 106  CO2 23 22 26 26   GLUCOSE 179* 73 114* 129*  BUN 20 24* 24* 15  CREATININE 1.47* 1.57* 1.29* 0.98  CALCIUM 9.2 7.9* 8.0* 8.2*  MG  --   --  1.3* 2.5*  PHOS  --   --  2.1* 1.7*   GFR: Estimated Creatinine Clearance: 77 mL/min (by C-G formula based on SCr of 0.98 mg/dL). Liver Function Tests: Recent Labs  Lab 07/14/20 1341  AST 62*  ALT 22  ALKPHOS 86  BILITOT 1.8*  PROT 7.2  ALBUMIN 3.8   No results for input(s): LIPASE, AMYLASE in the last 168 hours. No results for input(s): AMMONIA in the last 168 hours. Coagulation Profile: Recent Labs  Lab 07/14/20 1341  INR 1.1   Cardiac Enzymes: Recent Labs  Lab 07/14/20 1341 07/16/20 1609  CKTOTAL 1,185* 693*   BNP (last 3 results) No results for input(s): PROBNP in the last 8760 hours. HbA1C: No results for input(s): HGBA1C in the last 72 hours. CBG: Recent Labs  Lab 07/16/20 1622 07/16/20 1920 07/16/20 2354 07/17/20 0751 07/17/20 1143  GLUCAP 128* 128* 115* 125* 111*   Lipid Profile: No results for input(s): CHOL, HDL, LDLCALC, TRIG, CHOLHDL, LDLDIRECT in the last 72 hours. Thyroid Function Tests: No results for input(s): TSH, T4TOTAL, FREET4, T3FREE, THYROIDAB in the last 72 hours. Anemia Panel: No results for input(s): VITAMINB12, FOLATE, FERRITIN, TIBC, IRON, RETICCTPCT in the last 72 hours. Sepsis Labs: Recent Labs  Lab  07/14/20 1341 07/14/20 1547 07/16/20 1609 07/16/20 1847  LATICACIDVEN 4.0* 2.5* 1.4 1.1    Recent Results (from the past 240 hour(s))  Blood Culture (routine x 2)     Status: None (Preliminary result)   Collection Time: 07/14/20  1:41 PM   Specimen: BLOOD  Result Value Ref Range Status   Specimen Description BLOOD BLOOD RIGHT FOREARM  Final   Special Requests   Final    BOTTLES DRAWN AEROBIC AND ANAEROBIC Blood Culture adequate volume   Culture   Final    NO GROWTH 3 DAYS Performed at [facility], [address], [place], [place] [address]    Report Status PENDING  Incomplete  Blood Culture (routine x 2)     Status: None (Preliminary result)   Collection Time: 07/14/20  1:41 PM   Specimen: BLOOD  Result Value Ref Range Status   Specimen Description BLOOD BLOOD RIGHT HAND  Final   Special Requests   Final    BOTTLES DRAWN AEROBIC AND ANAEROBIC Blood Culture adequate volume   Culture   Final    NO GROWTH 3 DAYS Performed at [facility], [address] [place], [place] [address]    Report Status PENDING  Incomplete  Urine culture     Status: None   Collection Time: 07/14/20  1:41 PM   Specimen: In/Out Cath Urine  Result Value Ref Range Status   Specimen Description   Final    IN/OUT CATH URINE Performed at [facility], [address], [place], [place] [address]    Special Requests   Final    NONE Performed at [facility], [address], [place], [place] [address]    Culture   Final    NO GROWTH Performed at [facility], [address] [place], [place] [address]    Report Status 07/15/2020 FINAL  Final  SARS Coronavirus 2 by RT PCR (hospital order, performed in [facility] hospital lab) Nasopharyngeal Nasopharyngeal Swab     Status: None   Collection Time: 07/14/20  1:41 PM   Specimen: Nasopharyngeal Swab  Result Value Ref Range Status   SARS Coronavirus 2 NEGATIVE NEGATIVE Final    Comment: (NOTE) SARS-CoV-2  target nucleic acids are NOT DETECTED.  The SARS-CoV-2 RNA is generally detectable in upper and lower respiratory specimens during the acute phase of infection. The lowest concentration of SARS-CoV-2 viral copies this assay can detect is 250 copies / mL. A  negative result does not preclude SARS-CoV-2 infection and should not be used as the sole basis for treatment or other patient management decisions.  A negative result may occur with improper specimen collection / handling, submission of specimen other than nasopharyngeal swab, presence of viral mutation(s) within the areas targeted by this assay, and inadequate number of viral copies (<250 copies / mL). A negative result must be combined with clinical observations, patient history, and epidemiological information.  Fact Sheet for Patients:   BoilerBrush.com.cyhttps://www.fda.gov/media/136312/download  Fact Sheet for Healthcare Providers: https://pope.com/https://www.fda.gov/media/136313/download  This test is not yet approved or  cleared by the [place] FDA and has been authorized for detection and/or diagnosis of SARS-CoV-2 by FDA under an Emergency Use Authorization (EUA).  This EUA will remain in effect (meaning this test can be used) for the duration of the COVID-19 declaration under Section 564(b)(1) of the Act, 21 U.S.C. section 360bbb-3(b)(1), unless the authorization is terminated or revoked sooner.  Performed at [facility], [address] [place], [place] [address]      Radiology Studies: EEG  Result Date: 07/16/2020 Charlsie QuestYadav, Priyanka O, MD     07/16/2020  3:37 PM Patient Name: Deforest HoylesRobert E Mcree MRN: [id number] Epilepsy Attending: Charlsie QuestPriyanka O Yadav Referring Physician/Provider: Dr Arnetha CourserSumayya Tabias Swayze Date: 07/16/2020 Duration: 28.11 mins Patient history: 69 y.o. male admitted 2 days ago for acute hypoxic and hypercapnic respiratory failure, continues to have decreased responsiveness.EE to evaluate for seizure Level of alertness:  lethargic AEDs  during EEG study: Gabapentin Technical aspects: This EEG study was done with scalp electrodes positioned according to the 10-20 International system of electrode placement. Electrical activity was acquired at a sampling rate of 500Hz  and reviewed with a high frequency filter of 70Hz  and a low frequency filter of 1Hz . EEG data were recorded continuously and digitally stored. Description: No posterior dominant rhythm was seen. EEG showed continuous generalized polymorphic3 to 6 Hz theta-delta slowing. Hyperventilation and photic stimulation were not performed.   ABNORMALITY -Continued slow, generalized IMPRESSION: This study is suggestive of severe diffuse encephalopathy, nonspecific etiology. No seizures or epileptiform discharges were seen throughout the recording. Charlsie Questriyanka O Yadav   MR BRAIN WO CONTRAST  Result Date: 07/17/2020 CLINICAL DATA:  Transient ischemic attack (TIA).  Falls. EXAM: MRI HEAD WITHOUT CONTRAST TECHNIQUE: Multiplanar, multiecho pulse sequences of the brain and surrounding structures were obtained without intravenous contrast. COMPARISON:  CT head 07/14/2020 FINDINGS: Evaluation is limited by motion. Brain: No acute infarction, hemorrhage, hydrocephalus, extra-axial collection or mass lesion. Tiny remote right cerebellar lacunar infarct. Mild prominence of the bifrontal extra-axial spaces. Minimal scattered T2/FLAIR hyperintensities, compatible with age-appropriate chronic microvascular ischemic disease. Mild diffuse cerebral volume loss. Vascular: Flow voids of the proximal major intracranial arteries are maintained. Skull and upper cervical spine: Limited evaluation given patient motion on the sagittal T1 sequence. Grossly normal marrow signal. Sinuses/Orbits: Mild ethmoid air cell and sphenoid sinus mucosal thickening without air-fluid levels. Negative orbits. Other: Small bilateral mastoid effusions. IMPRESSION: Motion limited exam without evidence of acute intracranial abnormality.  Specifically, no acute infarct. Electronically Signed   By: Feliberto HartsFrederick S Jones MD   On: 07/17/2020 10:38   US Venous Img Upper Uni Right(DVT)  Result Date: 07/16/2020 CLINICAL DATA:  Right upper extremity edema. Patient is currently on anticoagulation. Evaluate for DVT. EXAM: RIGHT UPPER EXTREMITY VENOUS DOPPLER ULTRASOUND TECHNIQUE: Gray-scale sonography with graded compression, as well as color Doppler and duplex ultrasound were performed to evaluate the upper extremity deep venous system from the level of the subclavian  vein and including the jugular, axillary, basilic, radial, ulnar and upper cephalic vein. Spectral Doppler was utilized to evaluate flow at rest and with distal augmentation maneuvers. COMPARISON:  None. FINDINGS: Contralateral Subclavian Vein: Respiratory phasicity is normal and symmetric with the symptomatic side. No evidence of thrombus. Normal compressibility. Internal Jugular Vein: No evidence of thrombus. Normal compressibility, respiratory phasicity and response to augmentation. Subclavian Vein: No evidence of thrombus. Normal compressibility, respiratory phasicity and response to augmentation. Axillary Vein: No evidence of thrombus. Normal compressibility, respiratory phasicity and response to augmentation. Cephalic Vein: No evidence of thrombus. Normal compressibility, respiratory phasicity and response to augmentation. Basilic Vein: There is age-indeterminate mixed echogenic expansile occlusive thrombus within the right basilic vein (images 25, 26 and 27). Brachial Veins: No evidence of thrombus. Normal compressibility, respiratory phasicity and response to augmentation. Radial Veins: No evidence of thrombus. Normal compressibility, respiratory phasicity and response to augmentation. Ulnar Veins: No evidence of thrombus. Normal compressibility, respiratory phasicity and response to augmentation. Venous Reflux:  None visualized. Other Findings:  None visualized. IMPRESSION: 1. No  evidence of DVT within the right upper extremity. 2. Examination is positive for age-indeterminate occlusive superficial thrombophlebitis involving the right basilic vein. There is no extension of this age-indeterminate, potentially chronic, occlusive SVT to the venous system of the right upper extremity. Electronically Signed   By: Simonne Come M.D.   On: 07/16/2020 16:26   DG Chest Port 1 View  Result Date: 07/17/2020 CLINICAL DATA:  Cough. EXAM: PORTABLE CHEST 1 VIEW COMPARISON:  July 15, 2020. FINDINGS: Stable cardiomediastinal silhouette. No pneumothorax or pleural effusion is noted. Lungs are clear. Endotracheal and nasogastric tubes have been removed. Bilateral rib fractures are again noted. IMPRESSION: No acute cardiopulmonary abnormality seen. Electronically Signed   By: Lupita Raider M.D.   On: 07/17/2020 12:51    Scheduled Meds: . Chlorhexidine Gluconate Cloth  6 each Topical Daily  . gabapentin  100 mg Oral TID  . mouth rinse  15 mL Mouth Rinse BID  . mirtazapine  15 mg Oral QHS  . nicotine  14 mg Transdermal Daily  . pantoprazole  20 mg Oral Daily  . sodium chloride flush  3 mL Intravenous Q12H  . thiamine injection  100 mg Intravenous Daily   Continuous Infusions: . sodium chloride    . dextrose 5 % and 0.9% NaCl Stopped (07/17/20 0744)  . famotidine (PEPCID) IV 20 mg (07/17/20 0900)  . potassium PHOSPHATE IVPB (in mmol) 30 mmol (07/17/20 1401)     LOS: 3 days   Time spent: 40 minutes.  Arnetha Courser, MD [facility]  If 7PM-7AM, please contact night-coverage [email or web address]  07/17/2020, 2:14 PM   This record has been created using Conservation officer, historic buildings. Errors have been sought and corrected,but may not always be located. Such creation errors do not reflect on the standard of care.

## 2020-07-17 NOTE — Progress Notes (Signed)
SLP Cancellation Note  Patient Details Name: Joseph Owen MRN: [id number] DOB: July 29, 1951   Cancelled treatment:       Reason Eval/Treat Not Completed:  (chart reviewed and nursing consulted)    Pt continues to be inappropriate for PO intake d/t continued AMS and lethargy. NSG reported same. PO trials w/ such decreased alertness and focus for follow through w/ task are not appropriate at this time d/t the High risk for Aspiration. ST services will f/u w/ pt's status tomorrow and appropriateness for introduction of po's. Recommend frequetn oral care for hygiene and stimulation of swallowing. NSG agreed.  ST services will f/u w/ pt's status tomorrow and appropriateness for introduction of po's. Recommend frequent oral care for hygiene and stimulation of swallowing. NSG agreed.   Rayah Fines B. Dreama Saa M.S., CCC-SLP, [facility] Speech-Language Pathologist Rehabilitation Services Office [phone]    [name] 07/17/2020, 11:38 AM

## 2020-07-18 ENCOUNTER — Inpatient Hospital Stay: Payer: Medicare Other

## 2020-07-18 DIAGNOSIS — N179 Acute kidney failure, unspecified: Secondary | ICD-10-CM | POA: Diagnosis not present

## 2020-07-18 DIAGNOSIS — R4182 Altered mental status, unspecified: Secondary | ICD-10-CM

## 2020-07-18 DIAGNOSIS — F331 Major depressive disorder, recurrent, moderate: Secondary | ICD-10-CM | POA: Diagnosis present

## 2020-07-18 DIAGNOSIS — J9601 Acute respiratory failure with hypoxia: Secondary | ICD-10-CM | POA: Diagnosis not present

## 2020-07-18 LAB — CBC
HCT: 30 % — ABNORMAL LOW (ref 39.0–52.0)
Hemoglobin: 10.5 g/dL — ABNORMAL LOW (ref 13.0–17.0)
MCH: 34.5 pg — ABNORMAL HIGH (ref 26.0–34.0)
MCHC: 35 g/dL (ref 30.0–36.0)
MCV: 98.7 fL (ref 80.0–100.0)
Platelets: 167 10*3/uL (ref 150–400)
RBC: 3.04 MIL/uL — ABNORMAL LOW (ref 4.22–5.81)
RDW: 13.2 % (ref 11.5–15.5)
WBC: 10.5 10*3/uL (ref 4.0–10.5)
nRBC: 0 % (ref 0.0–0.2)

## 2020-07-18 LAB — GLUCOSE, CAPILLARY
Glucose-Capillary: 113 mg/dL — ABNORMAL HIGH (ref 70–99)
Glucose-Capillary: 113 mg/dL — ABNORMAL HIGH (ref 70–99)
Glucose-Capillary: 114 mg/dL — ABNORMAL HIGH (ref 70–99)
Glucose-Capillary: 117 mg/dL — ABNORMAL HIGH (ref 70–99)
Glucose-Capillary: 96 mg/dL (ref 70–99)

## 2020-07-18 LAB — PHOSPHORUS: Phosphorus: 2.5 mg/dL (ref 2.5–4.6)

## 2020-07-18 LAB — MAGNESIUM: Magnesium: 1.9 mg/dL (ref 1.7–2.4)

## 2020-07-18 LAB — BASIC METABOLIC PANEL
Anion gap: 9 (ref 5–15)
BUN: 14 mg/dL (ref 8–23)
CO2: 26 mmol/L (ref 22–32)
Calcium: 8.1 mg/dL — ABNORMAL LOW (ref 8.9–10.3)
Chloride: 108 mmol/L (ref 98–111)
Creatinine, Ser: 0.92 mg/dL (ref 0.61–1.24)
GFR calc Af Amer: >60 mL/min (ref >60)
GFR calc non Af Amer: >60 mL/min (ref >60)
Glucose, Bld: 117 mg/dL — ABNORMAL HIGH (ref 70–99)
Potassium: 3.7 mmol/L (ref 3.5–5.1)
Sodium: 143 mmol/L (ref 135–145)

## 2020-07-18 MED ORDER — ENOXAPARIN SODIUM 40 MG/0.4ML ~~LOC~~ SOLN
40.0000 mg | SUBCUTANEOUS | Status: DC
  Administered 2020-07-18 – 2020-07-29 (×12): 40 mg via SUBCUTANEOUS
  Filled 2020-07-18 (×12): qty 0.4

## 2020-07-18 NOTE — Consult Note (Signed)
[facility] Face-to-Face Psychiatry Consult   Reason for Consult:  Depression  Referring Physician:  Dr Nelson Chimes Patient Identification: Joseph Owen MRN:  [id number] Principal Diagnosis: AKI Diagnosis:  Active Problems:   Major depressive disorder, recurrent episode, moderate (HCC)   Respiratory failure (HCC)   Glasgow coma scale total score 3-8 (HCC)   AKI (acute kidney injury) (HCC)   Swelling of arm   Altered mental status  Total Time spent with patient: 45 minutes  Subjective:   Joseph Owen is a 69 y.o. male patient admitted with AKI.  Patient seen and evaluated in person by this provider.  He was lethargic and awakened briefly.  Minimal assessment obtained, dysphoric mood and shook no to any suicidal ideations.  Past history of depression in 6/21 per notes.  Patient scheduled to going to a SNF for extensive care per PT evaluation today.  Medication recommendations in treatment plan below.  No threat to self at this time, depression med recommendation below.  HPI per MD:  Joseph Owen is a 69 y.o. male with a past history of chronic pain and GERD who is brought to the ED by EMS after being found unresponsive.  Family last talked to him yesterday, today they could not get him to answer the phone when they called so they asked EMS to do a wellness check.  EMS found the patient on the bathroom floor with a tub overflowing with water.  Initial temp was 86 degrees.  They placed the patient on a nonrebreather and obtain an oxygen saturation of 92%.  Past Psychiatric History: depression  Risk to Self:  none Risk to Others:  none Prior Inpatient Therapy:  once at [facility] per notes Prior Outpatient Therapy:  unknown  Past Medical History: History reviewed. No pertinent past medical history. History reviewed. No pertinent surgical history. Family History: No family history on file. Family Psychiatric  History: none Social History:  Social History   Substance and Sexual Activity   Alcohol Use None     Social History   Substance and Sexual Activity  Drug Use Not on file    Social History   Socioeconomic History  . Marital status: Divorced    Spouse name: Not on file  . Number of children: Not on file  . Years of education: Not on file  . Highest education level: Not on file  Occupational History  . Not on file  Tobacco Use  . Smoking status: Former Games developer  . Smokeless tobacco: Never Used  Substance and Sexual Activity  . Alcohol use: Not on file  . Drug use: Not on file  . Sexual activity: Not on file  Other Topics Concern  . Not on file  Social History Narrative  . Not on file   Social Determinants of Health   Financial Resource Strain:   . Difficulty of Paying Living Expenses: Not on file  Food Insecurity:   . Worried About Programme researcher, broadcasting/film/video in the Last Year: Not on file  . Ran Out of Food in the Last Year: Not on file  Transportation Needs:   . Lack of Transportation (Medical): Not on file  . Lack of Transportation (Non-Medical): Not on file  Physical Activity:   . Days of Exercise per Week: Not on file  . Minutes of Exercise per Session: Not on file  Stress:   . Feeling of Stress : Not on file  Social Connections:   . Frequency of Communication with Friends and Family: Not  on file  . Frequency of Social Gatherings with Friends and Family: Not on file  . Attends Religious Services: Not on file  . Active Member of Clubs or Organizations: Not on file  . Attends Banker Meetings: Not on file  . Marital Status: Not on file   Additional Social History:    Allergies:  Not on File  Labs:  Results for orders placed or performed during the hospital encounter of 07/14/20 (from the past 48 hour(s))  Lactic acid, plasma     Status: None   Collection Time: 07/16/20  6:47 PM  Result Value Ref Range   Lactic Acid, Venous 1.1 0.5 - 1.9 mmol/L    Comment: Performed at [facility], [address] [place], [place]  [address]  Troponin I (High Sensitivity)     Status: Abnormal   Collection Time: 07/16/20  6:47 PM  Result Value Ref Range   Troponin I (High Sensitivity) 55 (H) <18 ng/L    Comment: (NOTE) Elevated high sensitivity troponin I (hsTnI) values and significant  changes across serial measurements may suggest ACS but many other  chronic and acute conditions are known to elevate hsTnI results.  Refer to the "Links" section for chest pain algorithms and additional  guidance. Performed at [facility], [address] [place], [place] [address]   Glucose, capillary     Status: Abnormal   Collection Time: 07/16/20  7:20 PM  Result Value Ref Range   Glucose-Capillary 128 (H) 70 - 99 mg/dL    Comment: Glucose reference range applies only to samples taken after fasting for at least 8 hours.  Glucose, capillary     Status: Abnormal   Collection Time: 07/16/20 11:54 PM  Result Value Ref Range   Glucose-Capillary 115 (H) 70 - 99 mg/dL    Comment: Glucose reference range applies only to samples taken after fasting for at least 8 hours.  Basic metabolic panel     Status: Abnormal   Collection Time: 07/17/20  5:51 AM  Result Value Ref Range   Sodium 140 135 - 145 mmol/L   Potassium 3.8 3.5 - 5.1 mmol/L   Chloride 106 98 - 111 mmol/L   CO2 26 22 - 32 mmol/L   Glucose, Bld 129 (H) 70 - 99 mg/dL    Comment: Glucose reference range applies only to samples taken after fasting for at least 8 hours.   BUN 15 8 - 23 mg/dL   Creatinine, Ser 2.95 0.61 - 1.24 mg/dL   Calcium 8.2 (L) 8.9 - 10.3 mg/dL   GFR calc non Af Amer >60 >60 mL/min   GFR calc Af Amer >60 >60 mL/min   Anion gap 8 5 - 15    Comment: Performed at [facility], [address] [place], [place] [address]  Magnesium     Status: Abnormal   Collection Time: 07/17/20  5:51 AM  Result Value Ref Range   Magnesium 2.5 (H) 1.7 - 2.4 mg/dL    Comment: Performed at [facility], [address], [place], [place]  [address]  Phosphorus     Status: Abnormal   Collection Time: 07/17/20  5:51 AM  Result Value Ref Range   Phosphorus 1.7 (L) 2.5 - 4.6 mg/dL    Comment: Performed at [facility], [address] [place], [place] [address]  Glucose, capillary     Status: Abnormal   Collection Time: 07/17/20  7:51 AM  Result Value Ref Range  Glucose-Capillary 125 (H) 70 - 99 mg/dL    Comment: Glucose reference range applies only to samples taken after fasting for at least 8 hours.  Glucose, capillary     Status: Abnormal   Collection Time: 07/17/20 11:43 AM  Result Value Ref Range   Glucose-Capillary 111 (H) 70 - 99 mg/dL    Comment: Glucose reference range applies only to samples taken after fasting for at least 8 hours.  Glucose, CSF     Status: None   Collection Time: 07/17/20  4:00 PM  Result Value Ref Range   Glucose, CSF 70 40 - 70 mg/dL    Comment: Performed at [facility], [address] [place], [place] [address]  Protein, CSF     Status: Abnormal   Collection Time: 07/17/20  4:00 PM  Result Value Ref Range   Total  Protein, CSF 54 (H) 15 - 45 mg/dL    Comment: Performed at [facility], [address] [place], [place] [address]  CSF cell count with differential     Status: Abnormal   Collection Time: 07/17/20  4:00 PM  Result Value Ref Range   Tube # 3    Color, CSF CLEAR (A) COLORLESS   Appearance, CSF COLORLESS (A) CLEAR   Supernatant CLEAR    RBC Count, CSF 25 (H) 0 - 3 /cu mm   WBC, CSF 7 (H) 0 - 5 /cu mm   Segmented Neutrophils-CSF 4 %   Lymphs, CSF 43 %   Monocyte-Macrophage-Spinal Fluid 53 %   Eosinophils, CSF 0 %    Comment: Performed at [facility], [address] [place], [place] [address]  CSF culture     Status: None (Preliminary result)   Collection Time: 07/17/20  4:00 PM   Specimen: PATH Cytology CSF; Cerebrospinal Fluid  Result Value Ref Range   Specimen Description      CSF Performed at [facility], [address] [place], [place] [address]    Special Requests      CSF Performed at [facility], [address] [place], [place] [address]    Gram Stain      NO ORGANISMS SEEN FEW RED BLOOD CELLS RARE WBC SEEN Performed at [facility], [address], [place], [place] [address]    Culture      NO GROWTH < 24 HOURS Performed at [facility], [address], [place], [place] [address]    Report Status PENDING   Culture, fungus without smear     Status: None (Preliminary result)   Collection Time: 07/17/20  4:00 PM   Specimen: PATH Cytology CSF; Cerebrospinal Fluid  Result Value Ref Range   Specimen Description      CSF Performed at [facility], [address], [place], [place] [address]    Special Requests      CSF Performed at [facility], [address] [place], [place] [address]    Culture      NO GROWTH < 12 HOURS Performed at [facility], [address] [place], [place] [address]    Report Status PENDING   Vitamin B12     Status: None   Collection Time: 07/17/20  5:02 PM  Result Value Ref Range   Vitamin B-12 804 180 - 914 pg/mL    Comment: (NOTE) This assay is not validated for testing neonatal or myeloproliferative syndrome specimens for Vitamin B12 levels. Performed at [facility],  [address], [place], [place] [address]   Ammonia     Status: None   Collection Time: 07/17/20  5:02 PM  Result Value Ref Range   Ammonia 11 9 - 35 umol/L    Comment: Performed at [facility], [address] [place], [place] [address]  Glucose, capillary     Status: None   Collection Time: 07/17/20  5:47 PM  Result Value Ref Range   Glucose-Capillary 98 70 - 99 mg/dL    Comment: Glucose reference range applies only to samples taken after fasting for at least 8 hours.  Glucose, capillary     Status: Abnormal   Collection Time: 07/17/20  8:52 PM  Result Value Ref Range   Glucose-Capillary 104 (H) 70 -  99 mg/dL    Comment: Glucose reference range applies only to samples taken after fasting for at least 8 hours.  Glucose, capillary     Status: Abnormal   Collection Time: 07/18/20 12:31 AM  Result Value Ref Range   Glucose-Capillary 117 (H) 70 - 99 mg/dL    Comment: Glucose reference range applies only to samples taken after fasting for at least 8 hours.  Basic metabolic panel     Status: Abnormal   Collection Time: 07/18/20  5:25 AM  Result Value Ref Range   Sodium 143 135 - 145 mmol/L   Potassium 3.7 3.5 - 5.1 mmol/L   Chloride 108 98 - 111 mmol/L   CO2 26 22 - 32 mmol/L   Glucose, Bld 117 (H) 70 - 99 mg/dL    Comment: Glucose reference range applies only to samples taken after fasting for at least 8 hours.   BUN 14 8 - 23 mg/dL   Creatinine, Ser 0.98 0.61 - 1.24 mg/dL   Calcium 8.1 (L) 8.9 - 10.3 mg/dL   GFR calc non Af Amer >60 >60 mL/min   GFR calc Af Amer >60 >60 mL/min   Anion gap 9 5 - 15    Comment: Performed at [facility], [address] [place], [place] [address]  Magnesium     Status: None   Collection Time: 07/18/20  5:25 AM  Result Value Ref Range   Magnesium 1.9 1.7 - 2.4 mg/dL    Comment: Performed at [facility], [address] [place], [place] [address]  Phosphorus     Status: None   Collection Time: 07/18/20  5:25 AM  Result Value Ref Range   Phosphorus 2.5 2.5 - 4.6 mg/dL    Comment: Performed at [facility], [address] [place], [place] [address]  CBC     Status: Abnormal   Collection Time: 07/18/20  5:25 AM  Result Value Ref Range   WBC 10.5 4.0 - 10.5 K/uL   RBC 3.04 (L) 4.22 - 5.81 MIL/uL   Hemoglobin 10.5 (L) 13.0 - 17.0 g/dL   HCT 86.5 (L) 39 - 52 %   MCV 98.7 80.0 - 100.0 fL   MCH 34.5 (H) 26.0 - 34.0 pg   MCHC 35.0 30.0 - 36.0 g/dL   RDW 78.4 69.6 - 29.5 %   Platelets 167 150 - 400 K/uL   nRBC 0.0 0.0 - 0.2 %    Comment: Performed at [facility], [address] [place], [place] [address]   Glucose, capillary     Status: None   Collection Time: 07/18/20  8:49 AM  Result Value Ref Range   Glucose-Capillary 96 70 - 99 mg/dL  Comment: Glucose reference range applies only to samples taken after fasting for at least 8 hours.  Glucose, capillary     Status: Abnormal   Collection Time: 07/18/20 12:03 PM  Result Value Ref Range   Glucose-Capillary 114 (H) 70 - 99 mg/dL    Comment: Glucose reference range applies only to samples taken after fasting for at least 8 hours.   Comment 1 Notify RN    Comment 2 Document in Chart     Current Facility-Administered Medications  Medication Dose Route Frequency Provider Last Rate Last Admin  . 0.9 %  sodium chloride infusion  250 mL Intravenous PRN Erin Fulling, MD      . acetaminophen (TYLENOL) tablet 650 mg  650 mg Oral Q4H PRN Erin Fulling, MD      . Chlorhexidine Gluconate Cloth 2 % PADS 6 each  6 each Topical Daily Judithe Modest, NP   6 each at 07/18/20 1005  . dextrose 5 %-0.9 % sodium chloride infusion   Intravenous Continuous Arnetha Courser, MD 100 mL/hr at 07/18/20 1636 New Bag at 07/18/20 1636  . diazepam (VALIUM) injection 2.5 mg  2.5 mg Intravenous Q4H PRN Erin Fulling, MD   2.5 mg at 07/16/20 0309  . docusate sodium (COLACE) capsule 100 mg  100 mg Oral BID PRN Erin Fulling, MD      . enoxaparin (LOVENOX) injection 40 mg  40 mg Subcutaneous Q24H Reatha Armour, RPH      . famotidine (PEPCID) IVPB 20 mg premix  20 mg Intravenous Q12H Erin Fulling, MD 100 mL/hr at 07/18/20 1007 20 mg at 07/18/20 1007  . folic acid injection 1 mg  1 mg Intravenous Daily Arnetha Courser, MD   1 mg at 07/18/20 1005  . gabapentin (NEURONTIN) capsule 100 mg  100 mg Oral TID Arnetha Courser, MD      . MEDLINE mouth rinse  15 mL Mouth Rinse BID Salena Saner, MD   15 mL at 07/18/20 1006  . mirtazapine (REMERON) tablet 15 mg  15 mg Oral QHS Amin, Tilman Neat, MD      . morphine 2 MG/ML injection 2 mg  2 mg Intravenous Q4H PRN Arnetha Courser, MD   2 mg  at 07/17/20 1804  . nicotine (NICODERM CQ - dosed in mg/24 hours) patch 14 mg  14 mg Transdermal Daily Erin Fulling, MD   14 mg at 07/18/20 1005  . ondansetron (ZOFRAN) injection 4 mg  4 mg Intravenous Q6H PRN Erin Fulling, MD      . pantoprazole (PROTONIX) EC tablet 20 mg  20 mg Oral Daily Arnetha Courser, MD      . polyethylene glycol (MIRALAX / GLYCOLAX) packet 17 g  17 g Oral Daily PRN Erin Fulling, MD      . sodium chloride flush (NS) 0.9 % injection 3 mL  3 mL Intravenous Q12H Erin Fulling, MD   3 mL at 07/18/20 1006  . sodium chloride flush (NS) 0.9 % injection 3 mL  3 mL Intravenous PRN Erin Fulling, MD      . thiamine (B-1) injection 100 mg  100 mg Intravenous Daily Pauletta Browns, MD   100 mg at 07/18/20 1004    Musculoskeletal: Strength & Muscle Tone: decreased Gait & Station: did not witness Patient leans: N/A  Psychiatric Specialty Exam: Physical Exam Vitals and nursing note reviewed.  Constitutional:      Appearance: Normal appearance.  HENT:     Head: Normocephalic.     Nose: Nose  normal.  Neurological:     General: No focal deficit present.  Psychiatric:        Attention and Perception: He is inattentive.        Mood and Affect: Mood is depressed.        Speech: Speech normal.        Behavior: Behavior normal. Behavior is cooperative.        Thought Content: Thought content normal.        Cognition and Memory: Cognition is impaired.     Review of Systems  Psychiatric/Behavioral: Positive for confusion and dysphoric mood.    Blood pressure 135/72, pulse 76, temperature 98.5 F (36.9 C), resp. rate 20, height 6' 0.01" (1.829 m), weight 84.2 kg, SpO2 100 %.Body mass index is 25.18 kg/m.  General Appearance: Casual  Eye Contact:  Minimal  Speech:  Slow  Volume:  Decreased  Mood:  Dysphoric  Affect:  Congruent  Thought Process:  Not able to assess  Orientation:  person  Thought Content:  Unable to assess  Suicidal Thoughts:  shook head no  Homicidal  Thoughts:  No  Memory:  unable to assess  Judgement:  Impaired  Insight:  Lacking  Psychomotor Activity:  Decreased  Concentration:  Concentration: Poor and Attention Span: Poor  Recall:  unable to assess  Fund of Knowledge:  unable to assess  Language:  Poor  Akathisia:  none  Handed:  Right  AIMS (if indicated):     Assets:  Housing Resilience  ADL's:  Impaired  Cognition:  Impaired,  Moderate  Sleep:        Treatment Plan Summary: Major depressive disorder, recurrent, moderate: -Recommend starting Lexapro 5 mg daily  Anxiety: -Recommend reducing benzodiazepines to assist with lethargy  Patient likely has ongoing delirium which presents as fluctuating cognition.  At time of this assessment patient has full capacity.  The patient's exam is notable for altered sensorium, perceptual disturbances, disorientation and cognitive deficits that appear markedly different than their baseline, suggesting a diagnosis of delirium.  Virtually any medical condition or physiologic stress can precipitate delirium in a susceptible individual, with risk increasing in those with: advanced age, sensory impairments, organic brain disease (stroke, dementia, Parkinsons), psychiatric illness, major chronic medical issues, prolonged hospitalizations, postoperative status, anemia, insomnia/disturbed sleep, and severe pain. Addressing the underlying medical condition and institution of preventative measures are recommended.  - Continue to monitor and treat underlying medical causes of delirium, including infection, electrolyte disturbances, etc. - Delirium precautions - Minimize/avoid deliriogenic meds including: anticholinergic, opiates, benzodiazepines           - Maintain hydration, oxygenation, nutrition           - Limit use of restraints and catheters           - Normalize sleep patterns by minimizing nighttime noise, light and interruptions by     -Ensure sleep apnea treatment is provided  overnight.             clustering care, opening blinds during the day           - Reorient the patient frequently, provide easily visible clock and calendar           - Provide sensory aids like glasses, hearing aids           - Encourage ambulation, regular activities and visitors to maintain cognitive stimulation   -Patient would benefit from having family members at bedside to reinforce his orientation.  Disposition: No evidence of  imminent risk to self or others at present.   Patient does not meet criteria for psychiatric inpatient admission.  Nanine Means, NP 07/18/2020 4:39 PM

## 2020-07-18 NOTE — Progress Notes (Signed)
Chart reviewed. Pt visited. Still too lethargic to safely participate with swallow eval. Reattempt when more alert.

## 2020-07-18 NOTE — Progress Notes (Signed)
PHARMACY CONSULT NOTE - FOLLOW UP  Pharmacy Consult for Electrolyte Monitoring and Replacement   Recent Labs: Potassium (mmol/L)  Date Value  07/18/2020 3.7   Magnesium (mg/dL)  Date Value  32/44/0102 1.9   Calcium (mg/dL)  Date Value  72/53/6644 8.1 (L)   Albumin (g/dL)  Date Value  03/47/4259 3.8   Phosphorus (mg/dL)  Date Value  56/38/7564 2.5   Sodium (mmol/L)  Date Value  07/18/2020 143     Assessment: 69 year old male admitted for respiratory failure requiring intubation. Patient now extubated, remains somnolent and NPO as a result. Patient with electrolyte abnormalities, pharmacy consulted to replace.  Goal of Therapy:  Electrolytes WNL  Plan:  No replacement needed at this time.   Will f/u with AM labs.   Ronnald Ramp ,PharmD Clinical Pharmacist 07/18/2020 7:18 AM

## 2020-07-18 NOTE — Evaluation (Signed)
Physical Therapy Evaluation Patient Details Name: Joseph Owen MRN: [id number] DOB: 09-18-1951 Today's Date: 07/18/2020   History of Present Illness  69 yo male with onset of sybcopal episode at home was found by EMS, was hypothermic, hypoxic, in AKI and in hypercapnic resp failure.  Intubated, extubated, found to have encephalopathy, cleared for seizures, had tachycardia and had benzodiazepine in his system.  At home was falling frequently, has been depressed and had summer incident of [facility] admit for suicide attempt.  PMHx:  foot drop, chronic pain, GERD, CKD 3  Clinical Impression  Pt was evaluated for physical therapy with a limited tolerance for mobility and inability to give a verbal history.  He is quite lethargic, reluctantly is agreeing to work with PT and demonstrating a dense weakness that keeps him from standing or agreeing to try.  Follow acutely to work on his deficits, encourage him to get OOB to chair and try to increase his mobility on RW as tolerated.  May be reluctant but will recommend SNF as pt is not mobile enough for home consideration.    Follow Up Recommendations SNF    Equipment Recommendations  None recommended by PT    Recommendations for Other Services       Precautions / Restrictions Precautions Precautions: Fall Restrictions Weight Bearing Restrictions: No      Mobility  Bed Mobility Overal bed mobility: Needs Assistance Bed Mobility: Sidelying to Sit;Sit to Sidelying   Sidelying to sit: Mod assist;Max assist     Sit to sidelying: Max assist General bed mobility comments: declines to try to stand  Transfers                 General transfer comment: declines to attempt  Ambulation/Gait                Stairs            Wheelchair Mobility    Modified Rankin (Stroke Patients Only)       Balance Overall balance assessment: Needs assistance;History of Falls Sitting-balance support: Feet supported;Bilateral upper  extremity supported Sitting balance-Leahy Scale: Fair                                       Pertinent Vitals/Pain Pain Assessment: Faces Faces Pain Scale: Hurts little more Pain Location: pt is sore generally to move legs Pain Descriptors / Indicators: Guarding Pain Intervention(s): Monitored during session;Repositioned    Home Living Family/patient expects to be discharged to:: Private residence Living Arrangements: Alone Available Help at Discharge: Other (Comment) Type of Home: House Home Access: Ramped entrance     Home Layout: One level Home Equipment: Walker - 2 wheels;Cane - single point Additional Comments: does not do full cooking per family in history on chart    Prior Function Level of Independence: Independent with assistive device(s)         Comments: Per chart from brother's history pt is living in a cluttered environment and does not use RW but instead using walking stick     Hand Dominance   Dominant Hand: Right    Extremity/Trunk Assessment   Upper Extremity Assessment Upper Extremity Assessment: Generalized weakness    Lower Extremity Assessment Lower Extremity Assessment: Generalized weakness    Cervical / Trunk Assessment Cervical / Trunk Assessment: Kyphotic  Communication   Communication: Expressive difficulties  Cognition Arousal/Alertness: Lethargic Behavior During Therapy: Flat affect Overall Cognitive  Status: Difficult to assess                                 General Comments: has some responses with mumbling that make sense and some are unintelligible      General Comments General comments (skin integrity, edema, etc.): pt is on side of bed with pulse 102 and sat 94%.  He is unable to verbalize intelligibly most of the session but can talk to PT about small details.  Most of history from previous admissions as pt reports he cannot remember things well    Exercises     Assessment/Plan    PT  Assessment Patient needs continued PT services  PT Problem List Decreased strength;Decreased range of motion;Decreased activity tolerance;Decreased balance;Decreased mobility;Decreased coordination;Decreased cognition;Decreased knowledge of use of DME;Decreased safety awareness;Cardiopulmonary status limiting activity;Decreased skin integrity;Pain       PT Treatment Interventions DME instruction;Gait training;Functional mobility training;Therapeutic activities;Therapeutic exercise;Balance training;Neuromuscular re-education;Patient/family education    PT Goals (Current goals can be found in the Care Plan section)  Acute Rehab PT Goals Patient Stated Goal: none stated PT Goal Formulation: Patient unable to participate in goal setting Time For Goal Achievement: 08/01/20 Potential to Achieve Goals: Good    Frequency Min 2X/week   Barriers to discharge Inaccessible home environment;Decreased caregiver support home with nearby help and ramp to enter    Co-evaluation               AM-PAC PT "6 Clicks" Mobility  Outcome Measure Help needed turning from your back to your side while in a flat bed without using bedrails?: A Lot Help needed moving from lying on your back to sitting on the side of a flat bed without using bedrails?: A Lot Help needed moving to and from a bed to a chair (including a wheelchair)?: A Lot Help needed standing up from a chair using your arms (e.g., wheelchair or bedside chair)?: Total Help needed to walk in hospital room?: Total Help needed climbing 3-5 steps with a railing? : Total 6 Click Score: 9    End of Session Equipment Utilized During Treatment: Gait belt;Oxygen Activity Tolerance: Patient limited by lethargy;Patient limited by fatigue Patient left: in bed;with call bell/phone within reach;with bed alarm set Nurse Communication: Mobility status PT Visit Diagnosis: Muscle weakness (generalized) (M62.81);Difficulty in walking, not elsewhere classified  (R26.2);Adult, failure to thrive (R62.7)    Time: 0925-1003 PT Time Calculation (min) (ACUTE ONLY): 38 min   Charges:   PT Evaluation $PT Eval Moderate Complexity: 1 Mod PT Treatments $Therapeutic Exercise: 8-22 mins $Therapeutic Activity: 8-22 mins       Ivar Drape 07/18/2020, 1:57 PM  Samul Dada, PT MS Acute Rehab Dept. Number: [facility] [id number] and [facility] [phone]

## 2020-07-18 NOTE — Consult Note (Addendum)
ANTICOAGULATION CONSULT NOTE - Follow Up Consult  Pharmacy Consult for enoxaparin Indication: VTE prophylaxis  Not on File  Patient Measurements: Height: 6' 0.01" (182.9 cm) Weight: 84.2 kg (185 lb 11.2 oz) IBW/kg (Calculated) : 77.62  Vital Signs: Temp: 98.5 F (36.9 C) (09/25 1237) Temp Source: Oral (09/25 1200) BP: 135/72 (09/25 1237) Pulse Rate: 76 (09/25 1237)  Labs: Recent Labs    07/16/20 0726 07/16/20 1609 07/16/20 1847 07/17/20 0551 07/18/20 0525  HGB 11.1*  --   --   --  10.5*  HCT 31.9*  --   --   --  30.0*  PLT 157  --   --   --  167  CREATININE 1.29*  --   --  0.98 0.92  CKTOTAL  --  693*  --   --   --   TROPONINIHS  --  60* 55*  --   --     Estimated Creatinine Clearance: 82 mL/min (by C-G formula based on SCr of 0.92 mg/dL).  Assessment: 69 year old male presenting with acute respiratory failure. Pt was comatose and emergently intubated. Pt unable to provide much detail. PMH includes GERD, frequent falls and unsteadiness. VTE prophylaxis with Lovenox was stopped 9/23 for lumbar puncture. Pharmacy consulted to redose lovenox for VTE prophylaxis.  Goal of Therapy:  Monitor platelets by anticoagulation protocol: Yes   Plan:  Lovenox 40 mg q24h SQ  Reatha Armour, PharmD Pharmacy Resident  07/18/2020 1:33 PM

## 2020-07-18 NOTE — Progress Notes (Signed)
Spoke to Radiology advised to advance NG tube 5 cm.  Pt tolerated well.  Will continue to monitor.  Ulis Rias, RN 07/18/2020 4:07 PM

## 2020-07-18 NOTE — Progress Notes (Signed)
PROGRESS NOTE  PCCM transfer.  Joseph Owen:[id number] DOB: November 26, 1950 DOA: 07/14/2020 PCP: Jaclyn Shaggy, MD   Brief Narrative: Taken from H&P.  Patient is unable to provide much detail. Joseph Owen is a 69 y.o. male with a past history of chronic pain, depression chronic history of frequent fall and unsteadiness which has been worked up by neurologist in the past, and GERD(found to have some esophageal stenosis which was dilated in July 2021) presented to ED after being found unresponsive on the bathroom floor with the tub overflowing with water.  Patient lives alone and family last talked with him 24 hours ago.  Unable to obtained significant history.  On arrival he was significantly hypoxic and unresponsive requiring emergent intubation.  Extubated on 07/15/2020 and care was transitioned to [facility] on 07/16/2020.  EKG with sinus tachycardia but was without any significant ST changes.  On arrival labs are positive for leukocytosis with WBC count of 27.7 and neutrophils of 24.9, no obvious source of infection, may be secondary to stress with fall and being unresponsive, multiple electrolyte abnormalities which include hypokalemia, hypomagnesemia and hypophosphatemia.  AKI with creatinine of 1.5.  Baseline around 1.1-1.2.  Lactic acid of 4-never checked again.  No record of any troponin checked.  Urine and blood cultures negative.  Toxicology only positive for benzos in UDS.  CK elevated at 1100.  CT head and cervical spine was without any acute abnormality.  Echocardiogram with normal EF and no wall motion abnormalities.  Apparently patient has an history of severe depression and he was admitted at behavioral health with suicidal attempt over the summer.  Sepsis was noted in ED note but ruled out as there is no source of infection.  Subjective: Patient remains very lethargic.  Does not open eyes but did state that I guess I am better and when asked about hunger he said yes.  He just  moved his right hand very slowly when asked.  Did not follow any other command.  Assessment & Plan:   Active Problems:   Respiratory failure (HCC)   Glasgow coma scale total score 3-8 (HCC)   AKI (acute kidney injury) (HCC)   Swelling of arm  Acute hypoxic respiratory failure.  Unknown etiology.  Toxicology was pretty much negative except benzos in UDS.  Patient has an history of suicidal attempt secondary to severe depression and was admitted in behavioral health over the summer.  Not sure whether urine was obtained after he received medications for intubation. Patient is extubated now and was saturating well on 2 L.  Decreased responsiveness/Encephalopathy/severe depression.  Patient still appears very lethargic and very slow to response.  On chart review he has an history of frequent falls and unsteadiness for many years and also being evaluated by a neurologist. Neurology was consulted and they started him on thiamine for some concern of Wernicke's.  MRI was done today which was negative for any acute abnormality. EEG -shows diffuse encephalopathy. Patient becomes febrile with a temperature of 100.3 this morning.  Urine and blood cultures remain negative.  Repeat chest x-ray obtained this morning was without any acute abnormality.  CSF studies done yesterday are inconclusive and cultures are negative. Troponin was positive at 60 and decreased to 55 on repeat check.  Consistent with demand only.  No lactic acidosis. Just had one episode of low-grade fever which resolved without intervention and leukocytosis improved without any antibiotics. Patient also has an history of underlying severe depression requiring hospitalization over the  summer.  Might be another exacerbation of his depression as there is no other source found.  Unable to participate with swallow evaluation again this morning as he was not cooperating. -We will put NG tube and start him on tube feeds. -Obtained a psych consult to  rule out any exacerbation of her underlying severe depression. -Continue to monitor. -I will hold off to antibiotics as fever resolved without any intervention, leukocytosis resolved.  Sepsis ruled out as there is no obvious source of infection and leukocytosis resolved.  Elevated troponin.  EKG without any acute changes.  Barely positive troponin most likely secondary to demand with a flat curve.  Electrolyte abnormalities.  Resolved. Patient found to have multiple electrolyte abnormalities which includes hypokalemia, hypomagnesemia and hypophosphatemia.  -Being managed by pharmacy. -Replete as needed and monitor.  Right upper extremity edema.  Venous Doppler obtained yesterday for the concern of right upper extremity edema which was negative for DVT.  AKI with CKD stage IIIa.  Resolved with IV hydration.  Creatinine below 1 today. -Gentle IV hydration. -Continue to monitor. -Avoid nephrotoxins.  Depression.  His current hospitalization might be secondary to exacerbation of his severe depression. -Psych was consulted today for evaluation. -Continue home dose of Remeron once able to take p.o.  Objective: Vitals:   07/18/20 0337 07/18/20 0848 07/18/20 1200 07/18/20 1237  BP: 128/63 (!) 135/96 (!) 145/80 135/72  Pulse: 72 79 79 76  Resp: 20 18 (!) 22 20  Temp: 98.4 F (36.9 C) 98.4 F (36.9 C) 98.7 F (37.1 C) 98.5 F (36.9 C)  TempSrc: Oral Oral Oral   SpO2: 100% 99% 100% 100%  Weight: 84.2 kg     Height:        Intake/Output Summary (Last 24 hours) at 07/18/2020 1246 Last data filed at 07/18/2020 1204 Gross per 24 hour  Intake 1540.75 ml  Output 1200 ml  Net 340.75 ml   Filed Weights   07/16/20 0430 07/17/20 0414 07/18/20 0337  Weight: 81 kg 81 kg 84.2 kg    Examination:  General.  Well-developed, lethargic gentleman, lying still but able to answer couple of questions and follow some commands, in no acute distress. Pulmonary.  Lungs clear bilaterally, normal  respiratory effort. CV.  Regular rate and rhythm, no JVD, rub or murmur. Abdomen.  Soft, nontender, nondistended, BS positive. CNS.  Alert and oriented x3.  No focal neurologic deficit. Extremities.  No edema, no cyanosis, pulses intact and symmetrical. Psychiatry.  Judgment and insight appears imparied.  DVT prophylaxis: Lovenox Code Status: Full Family Communication: Daughter Coralee North was updated on phone.  Brother does not want to make any decisions for him.  Patient lives alone, is divorced.   Disposition Plan:  Status is: Inpatient  Remains inpatient appropriate because:Inpatient level of care appropriate due to severity of illness   Dispo: The patient is from: Home              Anticipated d/c is to: To be determined              Anticipated d/c date is: 3 days              Patient currently is not medically stable to d/c.  Consultants:   PCCM  Neurology  Psychiatry.  Procedures:  Antimicrobials:   Data Reviewed: I have personally reviewed following labs and imaging studies  CBC: Recent Labs  Lab 07/14/20 1341 07/15/20 0427 07/16/20 0726 07/18/20 0525  WBC 27.7* 18.4* 18.6* 10.5  NEUTROABS 24.9*  --  16.6*  --   HGB 14.3 11.1* 11.1* 10.5*  HCT 40.5 30.0* 31.9* 30.0*  MCV 97.1 92.9 95.8 98.7  PLT 165 154 157 167   Basic Metabolic Panel: Recent Labs  Lab 07/14/20 1341 07/15/20 0427 07/16/20 0726 07/17/20 0551 07/18/20 0525  NA 137 138 136 140 143  K 2.9* 3.4* 3.1* 3.8 3.7  CL 96* 102 102 106 108  CO2 23 22 26 26 26   GLUCOSE 179* 73 114* 129* 117*  BUN 20 24* 24* 15 14  CREATININE 1.47* 1.57* 1.29* 0.98 0.92  CALCIUM 9.2 7.9* 8.0* 8.2* 8.1*  MG  --   --  1.3* 2.5* 1.9  PHOS  --   --  2.1* 1.7* 2.5   GFR: Estimated Creatinine Clearance: 82 mL/min (by C-G formula based on SCr of 0.92 mg/dL). Liver Function Tests: Recent Labs  Lab 07/14/20 1341  AST 62*  ALT 22  ALKPHOS 86  BILITOT 1.8*  PROT 7.2  ALBUMIN 3.8   No results for input(s):  LIPASE, AMYLASE in the last 168 hours. Recent Labs  Lab 07/17/20 1702  AMMONIA 11   Coagulation Profile: Recent Labs  Lab 07/14/20 1341  INR 1.1   Cardiac Enzymes: Recent Labs  Lab 07/14/20 1341 07/16/20 1609  CKTOTAL 1,185* 693*   BNP (last 3 results) No results for input(s): PROBNP in the last 8760 hours. HbA1C: No results for input(s): HGBA1C in the last 72 hours. CBG: Recent Labs  Lab 07/17/20 1747 07/17/20 2052 07/18/20 0031 07/18/20 0849 07/18/20 1203  GLUCAP 98 104* 117* 96 114*   Lipid Profile: No results for input(s): CHOL, HDL, LDLCALC, TRIG, CHOLHDL, LDLDIRECT in the last 72 hours. Thyroid Function Tests: No results for input(s): TSH, T4TOTAL, FREET4, T3FREE, THYROIDAB in the last 72 hours. Anemia Panel: Recent Labs    07/17/20 1702  VITAMINB12 804   Sepsis Labs: Recent Labs  Lab 07/14/20 1341 07/14/20 1547 07/16/20 1609 07/16/20 1847  LATICACIDVEN 4.0* 2.5* 1.4 1.1    Recent Results (from the past 240 hour(s))  Blood Culture (routine x 2)     Status: None (Preliminary result)   Collection Time: 07/14/20  1:41 PM   Specimen: BLOOD  Result Value Ref Range Status   Specimen Description BLOOD BLOOD RIGHT FOREARM  Final   Special Requests   Final    BOTTLES DRAWN AEROBIC AND ANAEROBIC Blood Culture adequate volume   Culture   Final    NO GROWTH 4 DAYS Performed at [facility], [address] [place], [place]    Report Status PENDING  Incomplete  Blood Culture (routine x 2)     Status: None (Preliminary result)   Collection Time: 07/14/20  1:41 PM   Specimen: BLOOD  Result Value Ref Range Status   Specimen Description BLOOD BLOOD RIGHT HAND  Final   Special Requests   Final    BOTTLES DRAWN AEROBIC AND ANAEROBIC Blood Culture adequate volume   Culture   Final    NO GROWTH 4 DAYS Performed at [facility], [address], [place], [place]    Report Status PENDING  Incomplete  Urine culture      Status: None   Collection Time: 07/14/20  1:41 PM   Specimen: In/Out Cath Urine  Result Value Ref Range Status   Specimen Description   Final    IN/OUT CATH URINE Performed at [facility], [address], [place], [place]    Special Requests  Final    NONE Performed at [facility], [address] [place], [place] [address]    Culture   Final    NO GROWTH Performed at [facility], [address] [place], [place] [address]    Report Status 07/15/2020 FINAL  Final  SARS Coronavirus 2 by RT PCR (hospital order, performed in [facility] hospital lab) Nasopharyngeal Nasopharyngeal Swab     Status: None   Collection Time: 07/14/20  1:41 PM   Specimen: Nasopharyngeal Swab  Result Value Ref Range Status   SARS Coronavirus 2 NEGATIVE NEGATIVE Final    Comment: (NOTE) SARS-CoV-2 target nucleic acids are NOT DETECTED.  The SARS-CoV-2 RNA is generally detectable in upper and lower respiratory specimens during the acute phase of infection. The lowest concentration of SARS-CoV-2 viral copies this assay can detect is 250 copies / mL. A negative result does not preclude SARS-CoV-2 infection and should not be used as the sole basis for treatment or other patient management decisions.  A negative result may occur with improper specimen collection / handling, submission of specimen other than nasopharyngeal swab, presence of viral mutation(s) within the areas targeted by this assay, and inadequate number of viral copies (<250 copies / mL). A negative result must be combined with clinical observations, patient history, and epidemiological information.  Fact Sheet for Patients:   BoilerBrush.com.cyhttps://www.fda.gov/media/136312/download  Fact Sheet for Healthcare Providers: https://pope.com/https://www.fda.gov/media/136313/download  This test is not yet approved or  cleared by the [place] FDA and has been authorized for detection and/or diagnosis of SARS-CoV-2 by FDA under an  Emergency Use Authorization (EUA).  This EUA will remain in effect (meaning this test can be used) for the duration of the COVID-19 declaration under Section 564(b)(1) of the Act, 21 U.S.C. section 360bbb-3(b)(1), unless the authorization is terminated or revoked sooner.  Performed at [facility], [address] [place], [place] [address]   CSF culture     Status: None (Preliminary result)   Collection Time: 07/17/20  4:00 PM   Specimen: PATH Cytology CSF; Cerebrospinal Fluid  Result Value Ref Range Status   Specimen Description   Final    CSF Performed at [facility], [address] [place], [place] [address]    Special Requests   Final    CSF Performed at [facility], [address] [place], [place] [address]    Gram Stain   Final    NO ORGANISMS SEEN FEW RED BLOOD CELLS RARE WBC SEEN Performed at [facility], [address] [place], [place] [address]    Culture   Final    NO GROWTH < 24 HOURS Performed at [facility], [address] [place], [place] [address]    Report Status PENDING  Incomplete  Culture, fungus without smear     Status: None (Preliminary result)   Collection Time: 07/17/20  4:00 PM   Specimen: PATH Cytology CSF; Cerebrospinal Fluid  Result Value Ref Range Status   Specimen Description   Final    CSF Performed at [facility], [address] [place], [place] [address]    Special Requests   Final    CSF Performed at [facility], [address] [place], [place] [address]    Culture   Final    NO GROWTH < 12 HOURS Performed at [facility], [address] [place], [place] [address]    Report Status PENDING  Incomplete     Radiology  Studies: EEG  Result Date: 07/16/2020 Charlsie Quest, MD     07/16/2020  3:37 PM Patient Name: AXXEL GUDE MRN: [id number] Epilepsy Attending: Charlsie Quest Referring Physician/Provider: Dr Arnetha Courser Date: 07/16/2020  Duration: 28.11 mins Patient history: 69 y.o. male admitted 2 days ago for acute hypoxic and hypercapnic respiratory failure, continues to have decreased responsiveness.EE to evaluate for seizure Level of alertness:  lethargic AEDs during EEG study: Gabapentin Technical aspects: This EEG study was done with scalp electrodes positioned according to the 10-20 International system of electrode placement. Electrical activity was acquired at a sampling rate of 500Hz  and reviewed with a high frequency filter of 70Hz  and a low frequency filter of 1Hz . EEG data were recorded continuously and digitally stored. Description: No posterior dominant rhythm was seen. EEG showed continuous generalized polymorphic3 to 6 Hz theta-delta slowing. Hyperventilation and photic stimulation were not performed.   ABNORMALITY -Continued slow, generalized IMPRESSION: This study is suggestive of severe diffuse encephalopathy, nonspecific etiology. No seizures or epileptiform discharges were seen throughout the recording.   MR BRAIN WO CONTRAST  Result Date: 07/17/2020 CLINICAL DATA:  Transient ischemic attack (TIA).  Falls. EXAM: MRI HEAD WITHOUT CONTRAST TECHNIQUE: Multiplanar, multiecho pulse sequences of the brain and surrounding structures were obtained without intravenous contrast. COMPARISON:  CT head 07/14/2020 FINDINGS: Evaluation is limited by motion. Brain: No acute infarction, hemorrhage, hydrocephalus, extra-axial collection or mass lesion. Tiny remote right cerebellar lacunar infarct. Mild prominence of the bifrontal extra-axial spaces. Minimal scattered T2/FLAIR hyperintensities, compatible with age-appropriate chronic microvascular ischemic disease. Mild diffuse cerebral volume loss. Vascular: Flow voids of the proximal major intracranial arteries are maintained. Skull and upper cervical spine: Limited evaluation given patient motion on the sagittal T1 sequence. Grossly normal marrow signal. Sinuses/Orbits:  Mild ethmoid air cell and sphenoid sinus mucosal thickening without air-fluid levels. Negative orbits. Other: Small bilateral mastoid effusions. IMPRESSION: Motion limited exam without evidence of acute intracranial abnormality. Specifically, no acute infarct. Electronically Signed   By: Charlsie Quest MD   On: 07/17/2020 10:38   07/16/2020 Venous Img Upper Uni Right(DVT)  Result Date: 07/16/2020 CLINICAL DATA:  Right upper extremity edema. Patient is currently on anticoagulation. Evaluate for DVT. EXAM: RIGHT UPPER EXTREMITY VENOUS DOPPLER ULTRASOUND TECHNIQUE: Gray-scale sonography with graded compression, as well as color Doppler and duplex ultrasound were performed to evaluate the upper extremity deep venous system from the level of the subclavian vein and including the jugular, axillary, basilic, radial, ulnar and upper cephalic vein. Spectral Doppler was utilized to evaluate flow at rest and with distal augmentation maneuvers. COMPARISON:  None. FINDINGS: Contralateral Subclavian Vein: Respiratory phasicity is normal and symmetric with the symptomatic side. No evidence of thrombus. Normal compressibility. Internal Jugular Vein: No evidence of thrombus. Normal compressibility, respiratory phasicity and response to augmentation. Subclavian Vein: No evidence of thrombus. Normal compressibility, respiratory phasicity and response to augmentation. Axillary Vein: No evidence of thrombus. Normal compressibility, respiratory phasicity and response to augmentation. Cephalic Vein: No evidence of thrombus. Normal compressibility, respiratory phasicity and response to augmentation. Basilic Vein: There is age-indeterminate mixed echogenic expansile occlusive thrombus within the right basilic vein (images 25, 26 and 27). Brachial Veins: No evidence of thrombus. Normal compressibility, respiratory phasicity and response to augmentation. Radial Veins: No evidence of thrombus. Normal compressibility, respiratory phasicity and  response to augmentation. Ulnar Veins: No evidence of thrombus. Normal compressibility, respiratory phasicity and response to augmentation. Venous Reflux:  None visualized. Other Findings:  None visualized. IMPRESSION: 1.  No evidence of DVT within the right upper extremity. 2. Examination is positive for age-indeterminate occlusive superficial thrombophlebitis involving the right basilic vein. There is no extension of this age-indeterminate, potentially chronic, occlusive SVT to the venous system of the right upper extremity. Electronically Signed   By: Simonne Come M.D.   On: 07/16/2020 16:26   DG Chest Port 1 View  Result Date: 07/17/2020 CLINICAL DATA:  Cough. EXAM: PORTABLE CHEST 1 VIEW COMPARISON:  July 15, 2020. FINDINGS: Stable cardiomediastinal silhouette. No pneumothorax or pleural effusion is noted. Lungs are clear. Endotracheal and nasogastric tubes have been removed. Bilateral rib fractures are again noted. IMPRESSION: No acute cardiopulmonary abnormality seen. Electronically Signed   By: Lupita Raider M.D.   On: 07/17/2020 12:51   DG FLUORO GUIDED NEEDLE PLC ASPIRATION/INJECTION LOC  Result Date: 07/17/2020 CLINICAL DATA:  Fever. EXAM: DIAGNOSTIC LUMBAR PUNCTURE UNDER FLUOROSCOPIC GUIDANCE FLUOROSCOPY TIME:  Fluoroscopy Time:  1 minutes 36 seconds Radiation Exposure Index (if provided by the fluoroscopic device): 32.4 mGy Number of Acquired Spot Images: 1 PROCEDURE: After discussing the risks and benefits of this procedure the patient informed consent was obtained. Back was sterilely prepped and draped. Following local anesthesia with 1% lidocaine a 22 gauge spinal needle was advanced into the lumbar spinal canal at the level of L4-L5. Opening pressure of 15 cm water noted. 11 cc of clear CSF obtained and sent to the laboratory for further evaluation. Spinal needle removed and hemostasis achieved. Patient sent back to his room in stable clinical condition. IMPRESSION: Successful  fluoroscopically directed lumbar puncture. 11 cc of clear CSF obtained and sent to laboratory for further evaluation. Electronically Signed   By: Maisie Fus  Register   On: 07/17/2020 17:00    Scheduled Meds: . Chlorhexidine Gluconate Cloth  6 each Topical Daily  . folic acid  1 mg Intravenous Daily  . gabapentin  100 mg Oral TID  . mouth rinse  15 mL Mouth Rinse BID  . mirtazapine  15 mg Oral QHS  . nicotine  14 mg Transdermal Daily  . pantoprazole  20 mg Oral Daily  . sodium chloride flush  3 mL Intravenous Q12H  . thiamine injection  100 mg Intravenous Daily   Continuous Infusions: . sodium chloride    . dextrose 5 % and 0.9% NaCl 100 mL/hr at 07/18/20 0556  . famotidine (PEPCID) IV 20 mg (07/18/20 1007)     LOS: 4 days   Time spent: 35 minutes.  Arnetha Courser, MD [facility]  If 7PM-7AM, please contact night-coverage [email or web address]  07/18/2020, 12:46 PM   This record has been created using Conservation officer, historic buildings. Errors have been sought and corrected,but may not always be located. Such creation errors do not reflect on the standard of care.

## 2020-07-19 DIAGNOSIS — N179 Acute kidney failure, unspecified: Secondary | ICD-10-CM | POA: Diagnosis not present

## 2020-07-19 DIAGNOSIS — J9601 Acute respiratory failure with hypoxia: Secondary | ICD-10-CM | POA: Diagnosis not present

## 2020-07-19 LAB — GLUCOSE, CAPILLARY
Glucose-Capillary: 91 mg/dL (ref 70–99)
Glucose-Capillary: 106 mg/dL — ABNORMAL HIGH (ref 70–99)
Glucose-Capillary: 109 mg/dL — ABNORMAL HIGH (ref 70–99)
Glucose-Capillary: 115 mg/dL — ABNORMAL HIGH (ref 70–99)

## 2020-07-19 LAB — CULTURE, BLOOD (ROUTINE X 2)
Culture: NO GROWTH
Culture: NO GROWTH
Special Requests: ADEQUATE
Special Requests: ADEQUATE

## 2020-07-19 LAB — BASIC METABOLIC PANEL
Anion gap: 7 (ref 5–15)
BUN: 14 mg/dL (ref 8–23)
CO2: 28 mmol/L (ref 22–32)
Calcium: 8.7 mg/dL — ABNORMAL LOW (ref 8.9–10.3)
Chloride: 111 mmol/L (ref 98–111)
Creatinine, Ser: 0.86 mg/dL (ref 0.61–1.24)
GFR calc Af Amer: >60 mL/min (ref >60)
GFR calc non Af Amer: >60 mL/min (ref >60)
Glucose, Bld: 104 mg/dL — ABNORMAL HIGH (ref 70–99)
Potassium: 3.6 mmol/L (ref 3.5–5.1)
Sodium: 146 mmol/L — ABNORMAL HIGH (ref 135–145)

## 2020-07-19 LAB — MAGNESIUM: Magnesium: 2 mg/dL (ref 1.7–2.4)

## 2020-07-19 MED ORDER — PANTOPRAZOLE SODIUM 40 MG PO PACK
20.0000 mg | PACK | Freq: Every day | ORAL | Status: DC
  Administered 2020-07-19 – 2020-07-20 (×2): 20 mg
  Filled 2020-07-19 (×3): qty 20

## 2020-07-19 MED ORDER — THIAMINE HCL 100 MG/ML IJ SOLN
500.0000 mg | Freq: Three times a day (TID) | INTRAVENOUS | Status: AC
  Administered 2020-07-19 – 2020-07-21 (×6): 500 mg via INTRAVENOUS
  Filled 2020-07-19 (×6): qty 5

## 2020-07-19 MED ORDER — SODIUM CHLORIDE 0.9% FLUSH
10.0000 mL | INTRAVENOUS | Status: DC | PRN
  Administered 2020-07-22 – 2020-07-24 (×3): 10 mL

## 2020-07-19 MED ORDER — FREE WATER
300.0000 mL | Status: DC
  Administered 2020-07-19 – 2020-07-20 (×6): 300 mL

## 2020-07-19 MED ORDER — GABAPENTIN 250 MG/5ML PO SOLN
100.0000 mg | Freq: Three times a day (TID) | ORAL | Status: DC
  Administered 2020-07-19 (×2): 100 mg
  Filled 2020-07-19 (×6): qty 2

## 2020-07-19 MED ORDER — SODIUM CHLORIDE 0.9% FLUSH
10.0000 mL | Freq: Two times a day (BID) | INTRAVENOUS | Status: DC
  Administered 2020-07-19: 10 mL
  Administered 2020-07-19: 20 mL
  Administered 2020-07-20 – 2020-07-29 (×19): 10 mL

## 2020-07-19 MED ORDER — OSMOLITE 1.2 CAL PO LIQD
1000.0000 mL | ORAL | Status: DC
  Administered 2020-07-19: 1000 mL

## 2020-07-19 NOTE — Progress Notes (Signed)
Brief Nutrition Note  Consult received for enteral/tube feeding initiation and management.  Adult Enteral Nutrition Protocol initiated. Full nutrition assessment to follow. NGT placed yesterday.  Per MD, start 300 ml free water flushes q 4 hours then discontinue D5.  Admitting Dx: Respiratory failure (HCC) [J96.90] AKI (acute kidney injury) (HCC) [N17.9] Septic shock (HCC) [A41.9, R65.21] Glasgow coma scale total score 3-8, at arrival to emergency department [facility]) [T01.7793]  Labs:  Recent Labs  Lab 07/16/20 0726 07/16/20 0726 07/17/20 0551 07/18/20 0525 07/19/20 0518  NA 136   < > 140 143 146*  K 3.1*   < > 3.8 3.7 3.6  CL 102   < > 106 108 111  CO2 26   < > 26 26 28   BUN 24*   < > 15 14 14   CREATININE 1.29*   < > 0.98 0.92 0.86  CALCIUM 8.0*   < > 8.2* 8.1* 8.7*  MG 1.3*   < > 2.5* 1.9 2.0  PHOS 2.1*  --  1.7* 2.5  --   GLUCOSE 114*   < > 129* 117* 104*   < > = values in this interval not displayed.    , MS, RD, LDN RD pager number/after hours weekend pager number on Amion.

## 2020-07-19 NOTE — Progress Notes (Signed)
PHARMACY CONSULT NOTE - FOLLOW UP  Pharmacy Consult for Electrolyte Monitoring and Replacement   Recent Labs: Potassium (mmol/L)  Date Value  07/19/2020 3.6   Magnesium (mg/dL)  Date Value  30/94/0768 2.0   Calcium (mg/dL)  Date Value  08/81/1031 8.7 (L)   Albumin (g/dL)  Date Value  59/45/8592 3.8   Phosphorus (mg/dL)  Date Value  92/44/6286 2.5   Sodium (mmol/L)  Date Value  07/19/2020 146 (H)     Assessment: 70 year old male admitted for respiratory failure requiring intubation. Patient now extubated, remains somnolent and NPO as a result. Patient with electrolyte abnormalities, pharmacy consulted to replace.  Goal of Therapy:  Electrolytes WNL  Plan:  No replacement needed at this time.   Will f/u with AM labs.   Ronnald Ramp ,PharmD Clinical Pharmacist 07/19/2020 8:04 AM

## 2020-07-19 NOTE — Progress Notes (Signed)
PROGRESS NOTE  PCCM transfer.  Joseph Owen  ASN:[id number] DOB: 1951/03/30 DOA: 07/14/2020 PCP: Jaclyn Shaggy, MD   Brief Narrative: Taken from H&P.  Patient is unable to provide much detail. Joseph Owen is a 69 y.o. male with a past history of chronic pain, depression chronic history of frequent fall and unsteadiness which has been worked up by neurologist in the past, and GERD(found to have some esophageal stenosis which was dilated in July 2021) presented to ED after being found unresponsive on the bathroom floor with the tub overflowing with water.  Patient lives alone and family last talked with him 24 hours ago.  Unable to obtained significant history.  On arrival he was significantly hypoxic and unresponsive requiring emergent intubation.  Extubated on 07/15/2020 and care was transitioned to [facility] on 07/16/2020.  EKG with sinus tachycardia but was without any significant ST changes.  On arrival labs are positive for leukocytosis with WBC count of 27.7 and neutrophils of 24.9, no obvious source of infection, may be secondary to stress with fall and being unresponsive, multiple electrolyte abnormalities which include hypokalemia, hypomagnesemia and hypophosphatemia.  AKI with creatinine of 1.5.  Baseline around 1.1-1.2.  Lactic acid of 4-never checked again.  No record of any troponin checked.  Urine and blood cultures negative.  Toxicology only positive for benzos in UDS.  CK elevated at 1100.  CT head and cervical spine was without any acute abnormality.  Echocardiogram with normal EF and no wall motion abnormalities.  Apparently patient has an history of severe depression and he was admitted at behavioral health with suicidal attempt over the summer.  Sepsis was noted in ED note but ruled out as there is no source of infection.  Subjective: Patient was little more awake when seen today.  Started telling me stories that he escaped from airport [place]4 [place] and he was at some other  places. More consistent with confabulations.  Denies any pain  Assessment & Plan:   Active Problems:   Respiratory failure (HCC)   Glasgow coma scale total score 3-8 (HCC)   AKI (acute kidney injury) (HCC)   Swelling of arm   Altered mental status   Major depressive disorder, recurrent episode, moderate (HCC)  Acute hypoxic respiratory failure.  Resolved  Unknown etiology.  Toxicology was pretty much negative except benzos in UDS.  Patient has an history of suicidal attempt secondary to severe depression and was admitted in behavioral health over the summer.  Not sure whether urine was obtained after he received medications for intubation. Patient is extubated now and was saturating well on room air.  Decreased responsiveness/Encephalopathy/severe depression. Wernicke's/Korsakoff.   Patient still appears very lethargic and very slow to response.  On chart review he has an history of frequent falls and unsteadiness for many years and also being evaluated by a neurologist. Neurology was consulted and they started him on thiamine for some concern of Wernicke's.  MRI was negative for any acute abnormality.  EEG -shows diffuse encephalopathy.  Urine and blood cultures remain negative.  Repeat chest x-ray obtained this morning was without any acute abnormality.  CSF studies are inconclusive and cultures are negative. Troponin was positive at 60 and decreased to 55 on repeat check.  Consistent with demand only.  No lactic acidosis. Just had one episode of low-grade fever which resolved without intervention and leukocytosis improved without any antibiotics. Psych evaluation was obtained to rule out exacerbation of his underlying depression.  Patient has a prior history  of hospitalization due to severe depression. Patient was little more alert today and his talking of different kind was more consistent with confabulations.-Concern of Wernicke's/Korsakoff because of his history of alcohol  abuse. -Start him on high-dose thiamine 500 mg 3 times a day for 2 days followed by 250 mg daily for another 5 days. -Continue to monitor. -I will hold off to antibiotics as fever resolved without any intervention, leukocytosis resolved.  Tube feeding.  NG tube was placed yesterday as patient was unable to participate with swallow team and was without any nutrition for more than 3 days.  He was started on tube feeding. -We will monitor and continue with tube feeding today and will try taking NG tube out and get another swallow evaluation once his sensorium becomes more clear.  Sepsis ruled out as there is no obvious source of infection and leukocytosis resolved.  Elevated troponin.  EKG without any acute changes.  Barely positive troponin most likely secondary to demand with a flat curve.  Electrolyte abnormalities.  Resolved. Patient found to have multiple electrolyte abnormalities which includes hypokalemia, hypomagnesemia and hypophosphatemia.  -Being managed by pharmacy. -Replete as needed and monitor.  Right upper extremity edema.  Venous Doppler obtained yesterday for the concern of right upper extremity edema which was negative for DVT.  AKI with CKD stage IIIa.  Resolved with IV hydration.  Creatinine below 1 today. -Gentle IV hydration. -Continue to monitor. -Avoid nephrotoxins.  Depression.  His current hospitalization might be secondary to exacerbation of his severe depression. -Psych was consulted -recommending starting Lexapro and there is no need for inpatient behavioral health. -Continue home dose of Remeron once able to take p.o.  Objective: Vitals:   07/18/20 1956 07/18/20 2000 07/19/20 0434 07/19/20 0811  BP: 138/66  (!) 156/82 (!) 153/83  Pulse: 77  71 72  Resp: Temp: (!) 100.6 F (38.1 C)  98.6 F (37 C) 98.6 F (37 C)  TempSrc: Oral  Oral Oral  SpO2: 100%  100% 100%  Weight:      Height:        Intake/Output Summary (Last 24 hours) at  07/19/2020 1456 Last data filed at 07/19/2020 0550 Gross per 24 hour  Intake 2392.45 ml  Output 900 ml  Net 1492.45 ml   Filed Weights   07/16/20 0430 07/17/20 0414 07/18/20 0337  Weight: 81 kg 81 kg 84.2 kg    Examination:  General.  Chronically ill-appearing gentleman, in no acute distress. Pulmonary.  Lungs clear bilaterally, normal respiratory effort. CV.  Regular rate and rhythm, no JVD, rub or murmur. Abdomen.  Soft, nontender, nondistended, BS positive. CNS.  Alert and oriented x3.  No focal neurologic deficit. Extremities.  No edema, no cyanosis, pulses intact and symmetrical. Psychiatry.  Judgment and insight appears impaired.  DVT prophylaxis: Lovenox Code Status: Full Family Communication: Daughter Coralee North was updated on phone.  Patient lives alone, is divorced.   Disposition Plan:  Status is: Inpatient  Remains inpatient appropriate because:Inpatient level of care appropriate due to severity of illness   Dispo: The patient is from: Home              Anticipated d/c is to: To be determined              Anticipated d/c date is: 3 days              Patient currently is not medically stable to d/c.  Consultants:   PCCM  Neurology  Psychiatry.  Procedures:  Antimicrobials:   Data Reviewed: I have personally reviewed following labs and imaging studies  CBC: Recent Labs  Lab 07/14/20 1341 07/15/20 0427 07/16/20 0726 07/18/20 0525  WBC 27.7* 18.4* 18.6* 10.5  NEUTROABS 24.9*  --  16.6*  --   HGB 14.3 11.1* 11.1* 10.5*  HCT 40.5 30.0* 31.9* 30.0*  MCV 97.1 92.9 95.8 98.7  PLT 165 154 157 167   Basic Metabolic Panel: Recent Labs  Lab 07/15/20 0427 07/16/20 0726 07/17/20 0551 07/18/20 0525 07/19/20 0518  NA 138 136 140 143 146*  K 3.4* 3.1* 3.8 3.7 3.6  CL 102 102 106 108 111  CO2 22 26 26 26 28   GLUCOSE 73 114* 129* 117* 104*  BUN 24* 24* 15 14 14   CREATININE 1.57* 1.29* 0.98 0.92 0.86  CALCIUM 7.9* 8.0* 8.2* 8.1* 8.7*  MG  --  1.3* 2.5*  1.9 2.0  PHOS  --  2.1* 1.7* 2.5  --    GFR: Estimated Creatinine Clearance: 87.7 mL/min (by C-G formula based on SCr of 0.86 mg/dL). Liver Function Tests: Recent Labs  Lab 07/14/20 1341  AST 62*  ALT 22  ALKPHOS 86  BILITOT 1.8*  PROT 7.2  ALBUMIN 3.8   No results for input(s): LIPASE, AMYLASE in the last 168 hours. Recent Labs  Lab 07/17/20 1702  AMMONIA 11   Coagulation Profile: Recent Labs  Lab 07/14/20 1341  INR 1.1   Cardiac Enzymes: Recent Labs  Lab 07/14/20 1341 07/16/20 1609  CKTOTAL 1,185* 693*   BNP (last 3 results) No results for input(s): PROBNP in the last 8760 hours. HbA1C: No results for input(s): HGBA1C in the last 72 hours. CBG: Recent Labs  Lab 07/18/20 1203 07/18/20 1656 07/18/20 2051 07/19/20 0807 07/19/20 1117  GLUCAP 114* 113* 113* 91 106*   Lipid Profile: No results for input(s): CHOL, HDL, LDLCALC, TRIG, CHOLHDL, LDLDIRECT in the last 72 hours. Thyroid Function Tests: No results for input(s): TSH, T4TOTAL, FREET4, T3FREE, THYROIDAB in the last 72 hours. Anemia Panel: Recent Labs    07/17/20 1702  VITAMINB12 804   Sepsis Labs: Recent Labs  Lab 07/14/20 1341 07/14/20 1547 07/16/20 1609 07/16/20 1847  LATICACIDVEN 4.0* 2.5* 1.4 1.1    Recent Results (from the past 240 hour(s))  Blood Culture (routine x 2)     Status: None   Collection Time: 07/14/20  1:41 PM   Specimen: BLOOD  Result Value Ref Range Status   Specimen Description BLOOD BLOOD RIGHT FOREARM  Final   Special Requests   Final    BOTTLES DRAWN AEROBIC AND ANAEROBIC Blood Culture adequate volume   Culture   Final    NO GROWTH 5 DAYS Performed at [facility], [address] [place], [place] [address]    Report Status 07/19/2020 FINAL  Final  Blood Culture (routine x 2)     Status: None   Collection Time: 07/14/20  1:41 PM   Specimen: BLOOD  Result Value Ref Range Status   Specimen Description BLOOD BLOOD RIGHT HAND  Final   Special  Requests   Final    BOTTLES DRAWN AEROBIC AND ANAEROBIC Blood Culture adequate volume   Culture   Final    NO GROWTH 5 DAYS Performed at [facility], [address] [place], [place] [address]    Report Status 07/19/2020 FINAL  Final  Urine culture     Status: None   Collection Time: 07/14/20  1:41 PM   Specimen: In/Out  Cath Urine  Result Value Ref Range Status   Specimen Description   Final    IN/OUT CATH URINE Performed at [facility], [address] [place], [place] [address]    Special Requests   Final    NONE Performed at [facility], [address], [place], [place] [address]    Culture   Final    NO GROWTH Performed at [facility], [address], [place], [place] [address]    Report Status 07/15/2020 FINAL  Final  SARS Coronavirus 2 by RT PCR (hospital order, performed in [facility] hospital lab) Nasopharyngeal Nasopharyngeal Swab     Status: None   Collection Time: 07/14/20  1:41 PM   Specimen: Nasopharyngeal Swab  Result Value Ref Range Status   SARS Coronavirus 2 NEGATIVE NEGATIVE Final    Comment: (NOTE) SARS-CoV-2 target nucleic acids are NOT DETECTED.  The SARS-CoV-2 RNA is generally detectable in upper and lower respiratory specimens during the acute phase of infection. The lowest concentration of SARS-CoV-2 viral copies this assay can detect is 250 copies / mL. A negative result does not preclude SARS-CoV-2 infection and should not be used as the sole basis for treatment or other patient management decisions.  A negative result may occur with improper specimen collection / handling, submission of specimen other than nasopharyngeal swab, presence of viral mutation(s) within the areas targeted by this assay, and inadequate number of viral copies (<250 copies / mL). A negative result must be combined with clinical observations, patient history, and epidemiological information.  Fact Sheet for Patients:    BoilerBrush.com.cy  Fact Sheet for Healthcare Providers: https://pope.com/  This test is not yet approved or  cleared by the [place] FDA and has been authorized for detection and/or diagnosis of SARS-CoV-2 by FDA under an Emergency Use Authorization (EUA).  This EUA will remain in effect (meaning this test can be used) for the duration of the COVID-19 declaration under Section 564(b)(1) of the Act, 21 U.S.C. section 360bbb-3(b)(1), unless the authorization is terminated or revoked sooner.  Performed at [facility], [address] [place], [place] [address]   CSF culture     Status: None (Preliminary result)   Collection Time: 07/17/20  4:00 PM   Specimen: PATH Cytology CSF; Cerebrospinal Fluid  Result Value Ref Range Status   Specimen Description   Final    CSF Performed at [facility], [address] [place], [place] [address]    Special Requests   Final    CSF Performed at [facility], [address] [place], [place] [address]    Gram Stain   Final    NO ORGANISMS SEEN FEW RED BLOOD CELLS RARE WBC SEEN Performed at [facility], [address], [place], [place] [address]    Culture   Final    NO GROWTH 2 DAYS Performed at [facility], [address] [place], [place] [address]    Report Status PENDING  Incomplete  Culture, fungus without smear     Status: None (Preliminary result)   Collection Time: 07/17/20  4:00 PM   Specimen: PATH Cytology CSF; Cerebrospinal Fluid  Result Value Ref Range Status   Specimen Description   Final    CSF Performed at [facility], [address] [place], [place] [address]    Special Requests   Final    CSF Performed at [facility], [address] [place] [place], [place] [address]  Culture   Final    NO FUNGUS ISOLATED AFTER 2 DAYS Performed at [facility], [address], [place], [place] [address]     Report Status PENDING  Incomplete     Radiology Studies: DG Abd 1 View  Result Date: 07/18/2020 CLINICAL DATA:  NG tube placement EXAM: ABDOMEN - 1 VIEW COMPARISON:  07/18/2020 FINDINGS: Again, the NG tube tip projects over the gastric body. However, the side hole again is likely located near the GE junction. The tube should be further advanced into the stomach by approximately 5 cm. The visualized bowel gas pattern is unremarkable. IMPRESSION: NG tube tip projects over the gastric body. However, the side hole remains near the GE junction. The tube should be further advanced into the stomach by approximately 5 cm. Electronically Signed   By: Katherine Mantle M.D.   On: 07/18/2020 16:40   DG Abd 1 View  Result Date: 07/18/2020 CLINICAL DATA:  NG tube placement EXAM: ABDOMEN - 1 VIEW COMPARISON:  07/14/2020 FINDINGS: The enteric tube projects over the gastric body. The side hole projects over the GE junction. There are old healed bilateral rib fractures. The visualized bowel gas pattern in the upper abdomen is unremarkable. IMPRESSION: Enteric tube projects over the gastric body. The side hole projects over the GE junction. The tube should be further advanced into the stomach. These results will be called to the ordering clinician or representative by the Radiologist Assistant, and communication documented in the PACS or [employer]. Electronically Signed   By: Katherine Mantle M.D.   On: 07/18/2020 15:51   DG FLUORO GUIDED NEEDLE PLC ASPIRATION/INJECTION LOC  Result Date: 07/17/2020 CLINICAL DATA:  Fever. EXAM: DIAGNOSTIC LUMBAR PUNCTURE UNDER FLUOROSCOPIC GUIDANCE FLUOROSCOPY TIME:  Fluoroscopy Time:  1 minutes 36 seconds Radiation Exposure Index (if provided by the fluoroscopic device): 32.4 mGy Number of Acquired Spot Images: 1 PROCEDURE: After discussing the risks and benefits of this procedure the patient informed consent was obtained. Back was sterilely prepped and draped. Following  local anesthesia with 1% lidocaine a 22 gauge spinal needle was advanced into the lumbar spinal canal at the level of L4-L5. Opening pressure of 15 cm water noted. 11 cc of clear CSF obtained and sent to the laboratory for further evaluation. Spinal needle removed and hemostasis achieved. Patient sent back to his room in stable clinical condition. IMPRESSION: Successful fluoroscopically directed lumbar puncture. 11 cc of clear CSF obtained and sent to laboratory for further evaluation. Electronically Signed   By: Maisie Fus  Register   On: 07/17/2020 17:00    Scheduled Meds: . Chlorhexidine Gluconate Cloth  6 each Topical Daily  . enoxaparin (LOVENOX) injection  40 mg Subcutaneous Q24H  . folic acid  1 mg Intravenous Daily  . free water  300 mL Per Tube Q4H  . gabapentin  100 mg Per Tube Q8H  . mouth rinse  15 mL Mouth Rinse BID  . mirtazapine  15 mg Oral QHS  . nicotine  14 mg Transdermal Daily  . pantoprazole sodium  20 mg Per Tube Daily  . sodium chloride flush  10-40 mL Intracatheter Q12H  . sodium chloride flush  3 mL Intravenous Q12H   Continuous Infusions: . sodium chloride    . dextrose 5 % and 0.9% NaCl 100 mL/hr at 07/19/20 0815  . famotidine (PEPCID) IV 20 mg (07/19/20 1056)  . feeding supplement (OSMOLITE 1.2 CAL) 1,000 mL (07/19/20 1422)  . thiamine injection       LOS:  5 days   Time spent: 30 minutes.  Arnetha Courser, MD [facility]  If 7PM-7AM, please contact night-coverage [email or web address]  07/19/2020, 2:56 PM   This record has been created using Conservation officer, historic buildings. Errors have been sought and corrected,but may not always be located. Such creation errors do not reflect on the standard of care.

## 2020-07-20 DIAGNOSIS — J9601 Acute respiratory failure with hypoxia: Secondary | ICD-10-CM | POA: Diagnosis not present

## 2020-07-20 DIAGNOSIS — N179 Acute kidney failure, unspecified: Secondary | ICD-10-CM | POA: Diagnosis not present

## 2020-07-20 LAB — GLUCOSE, CAPILLARY
Glucose-Capillary: 104 mg/dL — ABNORMAL HIGH (ref 70–99)
Glucose-Capillary: 107 mg/dL — ABNORMAL HIGH (ref 70–99)
Glucose-Capillary: 124 mg/dL — ABNORMAL HIGH (ref 70–99)
Glucose-Capillary: 124 mg/dL — ABNORMAL HIGH (ref 70–99)
Glucose-Capillary: 147 mg/dL — ABNORMAL HIGH (ref 70–99)
Glucose-Capillary: 96 mg/dL (ref 70–99)
Glucose-Capillary: 99 mg/dL (ref 70–99)

## 2020-07-20 LAB — BASIC METABOLIC PANEL
Anion gap: 9 (ref 5–15)
BUN: 15 mg/dL (ref 8–23)
CO2: 29 mmol/L (ref 22–32)
Calcium: 8.8 mg/dL — ABNORMAL LOW (ref 8.9–10.3)
Chloride: 109 mmol/L (ref 98–111)
Creatinine, Ser: 0.98 mg/dL (ref 0.61–1.24)
GFR calc Af Amer: >60 mL/min (ref >60)
GFR calc non Af Amer: >60 mL/min (ref >60)
Glucose, Bld: 119 mg/dL — ABNORMAL HIGH (ref 70–99)
Potassium: 3.5 mmol/L (ref 3.5–5.1)
Sodium: 147 mmol/L — ABNORMAL HIGH (ref 135–145)

## 2020-07-20 LAB — CBC
HCT: 32.6 % — ABNORMAL LOW (ref 39.0–52.0)
Hemoglobin: 10.7 g/dL — ABNORMAL LOW (ref 13.0–17.0)
MCH: 33.9 pg (ref 26.0–34.0)
MCHC: 32.8 g/dL (ref 30.0–36.0)
MCV: 103.2 fL — ABNORMAL HIGH (ref 80.0–100.0)
Platelets: 157 10*3/uL (ref 150–400)
RBC: 3.16 MIL/uL — ABNORMAL LOW (ref 4.22–5.81)
RDW: 13.1 % (ref 11.5–15.5)
WBC: 11 10*3/uL — ABNORMAL HIGH (ref 4.0–10.5)
nRBC: 0 % (ref 0.0–0.2)

## 2020-07-20 LAB — PHOSPHORUS: Phosphorus: 2.4 mg/dL — ABNORMAL LOW (ref 2.5–4.6)

## 2020-07-20 MED ORDER — GABAPENTIN 300 MG/6ML PO SOLN
100.0000 mg | Freq: Three times a day (TID) | ORAL | Status: DC
  Administered 2020-07-21 – 2020-07-30 (×25): 100 mg via ORAL
  Filled 2020-07-20 (×42): qty 2

## 2020-07-20 MED ORDER — HYDRALAZINE HCL 20 MG/ML IJ SOLN: 10.0000 mg | Freq: Three times a day (TID) | INTRAMUSCULAR | Status: DC | PRN

## 2020-07-20 MED ORDER — PANTOPRAZOLE SODIUM 40 MG PO PACK
20.0000 mg | PACK | Freq: Every day | ORAL | Status: DC
  Administered 2020-07-21 – 2020-07-29 (×9): 20 mg via ORAL
  Filled 2020-07-20 (×12): qty 20

## 2020-07-20 MED ORDER — K PHOS MONO-SOD PHOS DI & MONO 155-852-130 MG PO TABS
500.0000 mg | ORAL_TABLET | ORAL | Status: AC
  Administered 2020-07-20: 500 mg via ORAL
  Filled 2020-07-20: qty 2

## 2020-07-20 MED ORDER — THIAMINE HCL 100 MG/ML IJ SOLN
250.0000 mg | Freq: Every day | INTRAVENOUS | Status: DC
  Administered 2020-07-22 – 2020-07-24 (×3): 250 mg via INTRAVENOUS
  Filled 2020-07-20 (×3): qty 2.5

## 2020-07-20 MED ORDER — FREE WATER: 300.0000 mL | Status: DC

## 2020-07-20 MED ORDER — ADULT MULTIVITAMIN W/MINERALS CH
1.0000 | ORAL_TABLET | Freq: Every day | ORAL | Status: DC
  Administered 2020-07-21 – 2020-07-30 (×10): 1 via ORAL
  Filled 2020-07-20 (×10): qty 1

## 2020-07-20 MED ORDER — K PHOS MONO-SOD PHOS DI & MONO 155-852-130 MG PO TABS
500.0000 mg | ORAL_TABLET | ORAL | Status: DC
  Administered 2020-07-20: 500 mg
  Filled 2020-07-20 (×2): qty 2

## 2020-07-20 NOTE — Progress Notes (Signed)
Initial Nutrition Assessment  DOCUMENTATION CODES:   Not applicable  INTERVENTION:  NGT has now been removed so RD will discontinue tube feed orders.  Provide Hormel Shake po TID with meals, each supplement provides 520 kcal and 22 grams of protein.  Provide Magic cup TID with meals, each supplement provides 290 kcal and 9 grams of protein.  Provide MVI po daily.  If patient's PO intake does not improve recommend placing small-bore feeding tube (Dobbhoff tube) to resume tube feeding.  Monitor magnesium, potassium, and phosphorus daily for at least 3 days, MD to replete as needed, as pt is at risk for refeeding syndrome.  NUTRITION DIAGNOSIS:   Inadequate oral intake related to lethargy/confusion as evidenced by meal completion < 50%.  GOAL:   Patient will meet greater than or equal to 90% of their needs  MONITOR:   PO intake, Supplement acceptance, Diet advancement, Labs, Weight trends, I & O's  REASON FOR ASSESSMENT:   Consult Enteral/tube feeding initiation and management  ASSESSMENT:   69 year old male with PMHx of chronic pain, depression, frequent falls, GERD, esophageal stenosis s/p dilation 04/2020 admitted with decreased responsiveness/encephalopathy, possible Wernicke's/Korsakoff, AKI on CKD stage III.   9/21 intubated 9/22 extubated  Patient has been NPO during admission as he was too lethargic for diet advancement when assessed by SLP. On 9/25 NGT was placed and on 9/26 tube feeds were initiated per adult TF protocol.  RD assessed patient in room this AM. He had large-bore sump-style NGT in place and was receiving tube feeds. Patient was very lethargic and would not open eyes, move, or answer any questions for RD. Discussed with RN at that time and patient was tolerating tube feeds well.  Later in the day patient was re-assessed by SLP, diet was advanced to dysphagia 1 with nectar-thick, and NGT was removed. RD concerned patient will not be able to take in  enough to meet calorie/protein needs.  As patient unable to provide any history spoke with his daughter Baer Hinton over the phone. She reports patient's diet quality has been poor since the start of COVID-19 pandemic. She reports he lives alone now and he typically eats 1-2 meals per day. He does not have a stove and will only eat things he can easily prepare with a microwave. She reports he may have Hot Pockets, canned food, frozen food, or burger from fast food. She reports he loves Pepsi and drinks 2-3 servings per day. Daughter reports he is weight-stable.   Patient currently 84.7 kg (186.8 lbs).  Discussed with SLP in the afternoon. She reports patient took a few teaspoons of nectar-thick liquids and pureed foods this AM. When she went back at lunch he was able to have about 50% of a Magic Cup, 50% of the pureed meat, and 6 oz of nectar-thick liquids.   Medications reviewed and include: folic acid 1 mg daily IV, Remeron 15 mg QHS, nicotine patch, Protonix, famotidine, thiamine 500 mg Q8hrs IV from 9/26-9/28 followed by thiamine 250 mg IV from 9/29 to 10/4.  Labs reviewed: CBG 99-147, Sodium 147, Phosphorus 2.4.  Patient is at risk for development of acute malnutrition.  NUTRITION - FOCUSED PHYSICAL EXAM:    Most Recent Value  Orbital Region No depletion  Upper Arm Region No depletion  Thoracic and Lumbar Region No depletion  Buccal Region No depletion  Temple Region No depletion  Clavicle Bone Region No depletion  Clavicle and Acromion Bone Region Mild depletion  Scapular Bone Region Unable  to assess  Dorsal Hand No depletion  Patellar Region No depletion  Anterior Thigh Region No depletion  Posterior Calf Region No depletion  Edema (RD Assessment) Mild  Hair Reviewed  Eyes Unable to assess  Mouth Unable to assess  Skin Reviewed  Nails Reviewed     Diet Order:   Diet Order            DIET - DYS 1 Room service appropriate? Yes; Fluid consistency: Nectar Thick  Diet  effective now                EDUCATION NEEDS:   No education needs have been identified at this time  Skin:  Skin Assessment: Reviewed RN Assessment  Last BM:  07/19/2020 per chart  Height:   Ht Readings from Last 1 Encounters:  07/15/20 6' 0.01" (1.829 m)   Weight:   Wt Readings from Last 1 Encounters:  07/20/20 84.7 kg   Ideal Body Weight:  80.9 kg  BMI:  Body mass index is 25.33 kg/m.  Estimated Nutritional Needs:   Kcal:  2100-2300  Protein:  105-115 grams  Fluid:  2.1-2.3 L/day  Felix Pacini, MS, RD, LDN Pager number available on Amion

## 2020-07-20 NOTE — Progress Notes (Signed)
PROGRESS NOTE  PCCM transfer.  Joseph Owen  FMB:[id number] DOB: [birth year] DOA: 07/14/2020 PCP: Jaclyn Shaggy, MD   Brief Narrative: Taken from H&P.  Patient is unable to provide much detail. Joseph Owen is a 69 y.o. male with a past history of chronic pain, depression chronic history of frequent fall and unsteadiness which has been worked up by neurologist in the past, alcohol abuse and GERD(found to have some esophageal stenosis which was dilated in July 2021) presented to ED after being found unresponsive on the bathroom floor with the tub overflowing with water.  Patient lives alone and family last talked with him 24 hours ago.  Unable to obtained significant history.  On arrival he was significantly hypoxic and unresponsive requiring emergent intubation.  Extubated on 07/15/2020 and care was transitioned to [facility] on 07/16/2020.  EKG with sinus tachycardia but was without any significant ST changes.  On arrival labs are positive for leukocytosis with WBC count of 27.7 and neutrophils of 24.9, no obvious source of infection, may be secondary to stress with fall and being unresponsive, multiple electrolyte abnormalities which include hypokalemia, hypomagnesemia and hypophosphatemia.  AKI with creatinine of 1.5.  Baseline around 1.1-1.2.  Lactic acid of 4-never checked again.  No record of any troponin checked.  Urine and blood cultures negative.  Toxicology only positive for benzos in UDS.  CK elevated at 1100.  CT head and cervical spine was without any acute abnormality.  Echocardiogram with normal EF and no wall motion abnormalities.  Apparently patient has an history of severe depression and he was admitted at behavioral health with suicidal attempt over the summer.  Sepsis was noted in ED note but ruled out as there is no source of infection. All cultures include CSF was negative.  EEG with severe encephalopathy and no seizure-like activity.  Confabulations noted after couple of days  when he was little more awake.  Started on high-dose thiamine for concern of Wernicke's/Korsakoff encephalopathy.  Subjective: Patient appears lethargic and drowsy when seen during morning rounds.  NG tube wasn't placed.  Later he became more alert.  NG tube was removed and he was able to participate with swallow evaluation.  Assessment & Plan:   Active Problems:   Respiratory failure (HCC)   Glasgow coma scale total score 3-8 (HCC)   AKI (acute kidney injury) (HCC)   Swelling of arm   Altered mental status   Major depressive disorder, recurrent episode, moderate (HCC)  Acute hypoxic respiratory failure.  Resolved  Unknown etiology.  Toxicology was pretty much negative except benzos in UDS.  Patient has an history of suicidal attempt secondary to severe depression and was admitted in behavioral health over the summer.  Not sure whether urine was obtained after he received medications for intubation. Patient is extubated now and was saturating well on room air.  Encephalopathy/severe depression. Wernicke's/Korsakoff.  On chart review he has an history of frequent falls and unsteadiness for many years and also being evaluated by a neurologist with no diagnosis and was referred for physical therapy. Neurology was consulted and they signed off as imaging was negative for any acute changes.EEG -shows diffuse encephalopathy.  Urine and blood cultures remain negative.  Repeat chest x-ray was without any acute abnormality.  CSF studies are inconclusive and cultures are negative. Troponin was positive at 60 and decreased to 55 on repeat check.  Consistent with demand only.  No lactic acidosis. Just had one episode of low-grade fever which resolved without intervention and  leukocytosis improved without any antibiotics. Psych evaluation was obtained to rule out exacerbation of his underlying depression.  Patient has a prior history of hospitalization due to severe depression. After 4 days he become  little more alert and started talking which was consistent with confabulations.-Concern of Wernicke's/Korsakoff because of his history of alcohol abuse.  Patient was already receiving thiamine 100 mg daily since admission. -Start him on high-dose thiamine 500 mg 3 times a day for 2 days, started on 07/19/2020.  followed by 250 mg daily for another 5 days-start on 07/21/2020. -Continue to monitor. -I will hold off to antibiotics as fever resolved without any intervention, leukocytosis resolved.  Tube feeding.  Discontinued today. NG tube was placed after 4 days of hospitalization with continuation of severe encephalopathy and unable to take any p.o. he did receive 2 feet for 2 days.  NG tube was taken out today as he was more alert and he participated with swallow evaluation who were recommending dysphagia diet. He was also able to participate with PT and OT evaluation today-they're recommending SNF placement.  Sepsis ruled out as there is no obvious source of infection and leukocytosis resolved.  Elevated troponin.  EKG without any acute changes.  Barely positive troponin most likely secondary to demand with a flat curve.  Electrolyte abnormalities.  Resolved. Patient found to have multiple electrolyte abnormalities which includes hypokalemia, hypomagnesemia and hypophosphatemia.  Did develop some hypernatremia secondary to poor p.o. intake being managed initially with D5 and then later with free water through tube feed. -Being managed by pharmacy. -Replete as needed and monitor.  Right upper extremity edema.  Venous Doppler obtained yesterday for the concern of right upper extremity edema which was negative for DVT.  AKI with CKD stage IIIa.  Resolved with IV hydration.  Creatinine below 1 now. -Continue to monitor. -Avoid nephrotoxins.  Depression.  His current hospitalization might be secondary to exacerbation of his severe depression. -Psych was consulted -recommending starting Lexapro  and there is no need for inpatient behavioral health. -Continue home dose of Remeron-restarted today.  Objective: Vitals:   07/19/20 1708 07/20/20 0535 07/20/20 0736 07/20/20 1159  BP:  (!) 153/78 (!) 158/76 (!) 151/89  Pulse:  80 77 87  Resp:  18 18 19   Temp:  99.2 F (37.3 C) 98.1 F (36.7 C) 99.3 F (37.4 C)  TempSrc:  Oral Oral Oral  SpO2: 95% 98%  100%  Weight:  84.7 kg    Height:        Intake/Output Summary (Last 24 hours) at 07/20/2020 1544 Last data filed at 07/20/2020 1440 Gross per 24 hour  Intake 1200.08 ml  Output 850 ml  Net 350.08 ml   Filed Weights   07/17/20 0414 07/18/20 0337 07/20/20 0535  Weight: 81 kg 84.2 kg 84.7 kg    Examination:  General.  Chronically ill-appearing gentleman, in no acute distress. Pulmonary.  Lungs clear bilaterally, normal respiratory effort. CV.  Regular rate and rhythm, no JVD, rub or murmur. Abdomen.  Soft, nontender, nondistended, BS positive. CNS.  Awake and following simple commands. Extremities.  Bilateral UE edema, no cyanosis, pulses intact and symmetrical. Psychiatry.  Judgment and insight appears impaired.  DVT prophylaxis: Lovenox Code Status: Full Family Communication: Daughter 07/22/20 was updated on phone.  She is agreeable with SNF plan.  She will try to visit him in the next couple of days. Patient lives alone, is divorced.   Disposition Plan:  Status is: Inpatient  Remains inpatient appropriate because:Inpatient level of  care appropriate due to severity of illness   Dispo: The patient is from: Home              Anticipated d/c is to: To be determined              Anticipated d/c date is: 3 days              Patient currently is not medically stable to d/c.  Consultants:   PCCM  Neurology  Psychiatry.  Procedures:  Antimicrobials:   Data Reviewed: I have personally reviewed following labs and imaging studies  CBC: Recent Labs  Lab 07/14/20 1341 07/15/20 0427 07/16/20 0726 07/18/20 0525  07/20/20 0531  WBC 27.7* 18.4* 18.6* 10.5 11.0*  NEUTROABS 24.9*  --  16.6*  --   --   HGB 14.3 11.1* 11.1* 10.5* 10.7*  HCT 40.5 30.0* 31.9* 30.0* 32.6*  MCV 97.1 92.9 95.8 98.7 103.2*  PLT 165 154 157 167 157   Basic Metabolic Panel: Recent Labs  Lab 07/16/20 0726 07/17/20 0551 07/18/20 0525 07/19/20 0518 07/20/20 0531  NA 136 140 143 146* 147*  K 3.1* 3.8 3.7 3.6 3.5  CL 102 106 108 111 109  CO2 GLUCOSE 114* 129* 117* 104* 119*  BUN 24* CREATININE 1.29* 0.98 0.92 0.86 0.98  CALCIUM 8.0* 8.2* 8.1* 8.7* 8.8*  MG 1.3* 2.5* 1.9 2.0  --   PHOS 2.1* 1.7* 2.5  --  2.4*   GFR: Estimated Creatinine Clearance: 77 mL/min (by C-G formula based on SCr of 0.98 mg/dL). Liver Function Tests: Recent Labs  Lab 07/14/20 1341  AST 62*  ALT 22  ALKPHOS 86  BILITOT 1.8*  PROT 7.2  ALBUMIN 3.8   No results for input(s): LIPASE, AMYLASE in the last 168 hours. Recent Labs  Lab 07/17/20 1702  AMMONIA 11   Coagulation Profile: Recent Labs  Lab 07/14/20 1341  INR 1.1   Cardiac Enzymes: Recent Labs  Lab 07/14/20 1341 07/16/20 1609  CKTOTAL 1,185* 693*   BNP (last 3 results) No results for input(s): PROBNP in the last 8760 hours. HbA1C: No results for input(s): HGBA1C in the last 72 hours. CBG: Recent Labs  Lab 07/19/20 1951 07/20/20 0105 07/20/20 0539 07/20/20 0738 07/20/20 1225  GLUCAP 115* 107* 124* 147* 99   Lipid Profile: No results for input(s): CHOL, HDL, LDLCALC, TRIG, CHOLHDL, LDLDIRECT in the last 72 hours. Thyroid Function Tests: No results for input(s): TSH, T4TOTAL, FREET4, T3FREE, THYROIDAB in the last 72 hours. Anemia Panel: Recent Labs    07/17/20 1702  VITAMINB12 804   Sepsis Labs: Recent Labs  Lab 07/14/20 1341 07/14/20 1547 07/16/20 1609 07/16/20 1847  LATICACIDVEN 4.0* 2.5* 1.4 1.1    Recent Results (from the past 240 hour(s))  Blood Culture (routine x 2)     Status: None   Collection Time: 07/14/20   1:41 PM   Specimen: BLOOD  Result Value Ref Range Status   Specimen Description BLOOD BLOOD RIGHT FOREARM  Final   Special Requests   Final    BOTTLES DRAWN AEROBIC AND ANAEROBIC Blood Culture adequate volume   Culture   Final    NO GROWTH 5 DAYS Performed at [facility], [address], [place], [place] [address]    Report Status 07/19/2020 FINAL  Final  Blood Culture (routine x 2)     Status: None   Collection Time: 07/14/20  1:41 PM   Specimen:  BLOOD  Result Value Ref Range Status   Specimen Description BLOOD BLOOD RIGHT HAND  Final   Special Requests   Final    BOTTLES DRAWN AEROBIC AND ANAEROBIC Blood Culture adequate volume   Culture   Final    NO GROWTH 5 DAYS Performed at [facility], [address], [place], [place] [address]    Report Status 07/19/2020 FINAL  Final  Urine culture     Status: None   Collection Time: 07/14/20  1:41 PM   Specimen: In/Out Cath Urine  Result Value Ref Range Status   Specimen Description   Final    IN/OUT CATH URINE Performed at [facility], [address], [place], [place] [address]    Special Requests   Final    NONE Performed at [facility], [address] [place], [place] [address]    Culture   Final    NO GROWTH Performed at [facility], [address] [place], [place] [address]    Report Status 07/15/2020 FINAL  Final  SARS Coronavirus 2 by RT PCR (hospital order, performed in [facility] hospital lab) Nasopharyngeal Nasopharyngeal Swab     Status: None   Collection Time: 07/14/20  1:41 PM   Specimen: Nasopharyngeal Swab  Result Value Ref Range Status   SARS Coronavirus 2 NEGATIVE NEGATIVE Final    Comment: (NOTE) SARS-CoV-2 target nucleic acids are NOT DETECTED.  The SARS-CoV-2 RNA is generally detectable in upper and lower respiratory specimens during the acute phase of infection. The lowest concentration of SARS-CoV-2 viral copies this assay can detect is 250 copies /  mL. A negative result does not preclude SARS-CoV-2 infection and should not be used as the sole basis for treatment or other patient management decisions.  A negative result may occur with improper specimen collection / handling, submission of specimen other than nasopharyngeal swab, presence of viral mutation(s) within the areas targeted by this assay, and inadequate number of viral copies (<250 copies / mL). A negative result must be combined with clinical observations, patient history, and epidemiological information.  Fact Sheet for Patients:   BoilerBrush.com.cy  Fact Sheet for Healthcare Providers: https://pope.com/  This test is not yet approved or  cleared by the [place] FDA and has been authorized for detection and/or diagnosis of SARS-CoV-2 by FDA under an Emergency Use Authorization (EUA).  This EUA will remain in effect (meaning this test can be used) for the duration of the COVID-19 declaration under Section 564(b)(1) of the Act, 21 U.S.C. section 360bbb-3(b)(1), unless the authorization is terminated or revoked sooner.  Performed at [facility], [address] [place], [place] [address]   CSF culture     Status: None (Preliminary result)   Collection Time: 07/17/20  4:00 PM   Specimen: PATH Cytology CSF; Cerebrospinal Fluid  Result Value Ref Range Status   Specimen Description   Final    CSF Performed at [facility], [address] [place], [place] [address]    Special Requests   Final    CSF Performed at [facility], [address] [place], [place] [address]    Gram Stain   Final    NO ORGANISMS SEEN FEW RED BLOOD CELLS RARE WBC SEEN Performed at [facility], [address] [place], [place] [address]    Culture   Final    NO GROWTH 3 DAYS Performed at [facility], [address] [place], [place] [address]  Report Status PENDING  Incomplete    Culture, fungus without smear     Status: None (Preliminary result)   Collection Time: 07/17/20  4:00 PM   Specimen: PATH Cytology CSF; Cerebrospinal Fluid  Result Value Ref Range Status   Specimen Description   Final    CSF Performed at [facility], [address] [place], [place] [address]    Special Requests   Final    CSF Performed at [facility], [address] [place], [place] [address]    Culture   Final    NO FUNGUS ISOLATED AFTER 3 DAYS Performed at [facility], [address] [place], [place] [address]    Report Status PENDING  Incomplete     Radiology Studies: DG Abd 1 View  Result Date: 07/18/2020 CLINICAL DATA:  NG tube placement EXAM: ABDOMEN - 1 VIEW COMPARISON:  07/18/2020 FINDINGS: Again, the NG tube tip projects over the gastric body. However, the side hole again is likely located near the GE junction. The tube should be further advanced into the stomach by approximately 5 cm. The visualized bowel gas pattern is unremarkable. IMPRESSION: NG tube tip projects over the gastric body. However, the side hole remains near the GE junction. The tube should be further advanced into the stomach by approximately 5 cm. Electronically Signed   By: Katherine Mantlehristopher  Green M.D.   On: 07/18/2020 16:40    Scheduled Meds: . Chlorhexidine Gluconate Cloth  6 each Topical Daily  . enoxaparin (LOVENOX) injection  40 mg Subcutaneous Q24H  . folic acid  1 mg Intravenous Daily  . gabapentin  100 mg Oral Q8H  . mouth rinse  15 mL Mouth Rinse BID  . mirtazapine  15 mg Oral QHS  . [START ON 07/21/2020] multivitamin with minerals  1 tablet Oral Daily  . nicotine  14 mg Transdermal Daily  . [START ON 07/21/2020] pantoprazole sodium  20 mg Oral Daily  . sodium chloride flush  10-40 mL Intracatheter Q12H  . sodium chloride flush  3 mL Intravenous Q12H   Continuous Infusions: . sodium chloride    . famotidine (PEPCID) IV 20 mg (07/20/20 0904)  . [START ON  07/22/2020] thiamine injection    . thiamine injection 500 mg (07/20/20 1440)     LOS: 6 days   Time spent: 30 minutes.  Arnetha CourserSumayya Akeya Ryther, MD [facility]  If 7PM-7AM, please contact night-coverage [email or web address]  07/20/2020, 3:44 PM   This record has been created using Conservation officer, historic buildingsDragon voice recognition software. Errors have been sought and corrected,but may not always be located. Such creation errors do not reflect on the standard of care.

## 2020-07-20 NOTE — Progress Notes (Signed)
NG tube removed per MD.  Pt tolerated well.  RN will continue to monitor.  Ulis Rias, RN

## 2020-07-20 NOTE — Progress Notes (Signed)
PHARMACY CONSULT NOTE - FOLLOW UP  Pharmacy Consult for Electrolyte Monitoring and Replacement   Recent Labs: Potassium (mmol/L)  Date Value  07/20/2020 3.5   Magnesium (mg/dL)  Date Value  44/96/7591 2.0   Calcium (mg/dL)  Date Value  63/84/6659 8.8 (L)   Albumin (g/dL)  Date Value  93/57/0177 3.8   Phosphorus (mg/dL)  Date Value  93/90/3009 2.4 (L)   Sodium (mmol/L)  Date Value  07/20/2020 147 (H)     Assessment: 69 year old male admitted for respiratory failure requiring intubation. Patient was intubated 9/21 and extubated 9/22, remains somnolent and NPO as a result and placed on NG tube. Patient with electrolyte abnormalities, pharmacy consulted to replace. Na trending up, MD increased free water   9/27 NG tubed removed and patient participated in swallow evaluation. Patient diet advance from NPO to dysphagia diet (puree) and is able to tolerate PO meds.   Goal of Therapy:  Electrolytes WNL  Plan:  Phos 2.4 - ordered Kphos 500mg  q4h x2 doses All other electrolytes WNL - no replacement needed at this time Continue to monitor electrolytes with AM labs.   , PharmD Pharmacy Resident  07/20/2020 4:56 PM

## 2020-07-20 NOTE — Progress Notes (Signed)
Occupational Therapy Treatment Patient Details Name: MIN COLLYMORE MRN: [id number] DOB: [birth year] Today's Date: 07/20/2020    History of present illness 69 yo male with onset of sybcopal episode at home was found by EMS, was hypothermic, hypoxic, in AKI and in hypercapnic resp failure.  Intubated, extubated, found to have encephalopathy, cleared for seizures, had tachycardia and had benzodiazepine in his system.  At home was falling frequently, has been depressed and had summer incident of [facility] admit for suicide attempt.  PMHx:  foot drop, chronic pain, GERD, CKD 3   OT comments  Upon entering the room, pt supine in bed with eyes closed. Pt only opening eyes twice this session when OT providing verbal commands to do so. OT placing cold wash cloth on face and chest for alertness with pt making no movements of increased alertness. OT placed shower cap on pt's head to clean hair. Hand over hand to comb hair. Pt unable to hold comb independently secondary to increased edema and decreased grip strength. OT repositioning pt's neck into midline and pt crying out in pain with stretch. OT also utilized suction and suction toothbrush for oral care and OT hearing pt voice much clearer. SLP present to assess pt as well at end of session. OT donned B hand mitts onto pt.  Follow Up Recommendations  SNF    Equipment Recommendations  Other (comment) (defer to next venue of care)       Precautions / Restrictions Precautions Precautions: Fall Restrictions Weight Bearing Restrictions: No       Mobility Bed Mobility Overal bed mobility: Needs Assistance Bed Mobility: Rolling Rolling: Total assist     Transfers    General transfer comment: defer secondary to lethargy        ADL either performed or assessed with clinical judgement   ADL Overall ADL's : Needs assistance/impaired     Grooming: Wash/dry hands;Oral care;Total assistance;Bed level          Vision Baseline Vision/History:  Wears glasses Wears Glasses: At all times            Cognition Arousal/Alertness: Lethargic Behavior During Therapy: Flat affect Overall Cognitive Status: Difficult to assess    General Comments: pt with some mumbling responses but does very clearly get out, "that hurts" when repositioning head on pillow        Exercises Other Exercises Other Exercises: supine AAROM BLE x 10 - ankle pumps, heel slides, ab/add and slr           Pertinent Vitals/ Pain       Pain Assessment: No/denies pain Faces Pain Scale: Hurts even more Pain Location: neck Pain Descriptors / Indicators: Guarding;Moaning Pain Intervention(s): Repositioned;Limited activity within patient's tolerance;Monitored during session         Frequency  Min 1X/week        Progress Toward Goals  OT Goals(current goals can now be found in the care plan section)  Progress towards OT goals: Progressing toward goals  Acute Rehab OT Goals Patient Stated Goal: none stated OT Goal Formulation: Patient unable to participate in goal setting Time For Goal Achievement: 07/31/20 Potential to Achieve Goals: Fair  Plan Discharge plan remains appropriate       AM-PAC OT "6 Clicks" Daily Activity     Outcome Measure   Help from another person eating meals?: Total Help from another person taking care of personal grooming?: Total Help from another person toileting, which includes using toliet, bedpan, or urinal?: Total Help from another person  bathing (including washing, rinsing, drying)?: Total Help from another person to put on and taking off regular upper body clothing?: Total Help from another person to put on and taking off regular lower body clothing?: Total 6 Click Score: 6    End of Session    OT Visit Diagnosis: Muscle weakness (generalized) (M62.81);Other symptoms and signs involving cognitive function   Activity Tolerance Patient limited by lethargy   Patient Left in bed;with call bell/phone within  reach;with bed alarm set   Nurse Communication Other (comment) (general condition/need to cut off wrist band secondary to edema)        Time: 0937-1000 OT Time Calculation (min): 23 min  Charges: OT General Charges $OT Visit: 1 Visit OT Treatments $Self Care/Home Management : 8-22 mins $Therapeutic Activity: 8-22 mins  Jackquline Denmark, MS, OTR/L , CBIS ascom [phone]  07/20/20, 12:40 PM

## 2020-07-20 NOTE — Progress Notes (Signed)
Physical Therapy Treatment Patient Details Name: Joseph Owen MRN: [id number] DOB: July 01, 1951 Today's Date: 07/20/2020    History of Present Illness 69 yo male with onset of sybcopal episode at home was found by EMS, was hypothermic, hypoxic, in AKI and in hypercapnic resp failure.  Intubated, extubated, found to have encephalopathy, cleared for seizures, had tachycardia and had benzodiazepine in his system.  At home was falling frequently, has been depressed and had summer incident of [facility] admit for suicide attempt.  PMHx:  foot drop, chronic pain, GERD, CKD 3    PT Comments    Pt lethargic but agrees to ex.  Participated in exercises as described below.  Limited activity due to lethargy.  RN in stating he was more awake this am.  Will continue as appropriate.   Follow Up Recommendations  SNF     Equipment Recommendations  None recommended by PT    Recommendations for Other Services       Precautions / Restrictions Precautions Precautions: Fall Restrictions Weight Bearing Restrictions: No    Mobility  Bed Mobility Overal bed mobility: Needs Assistance Bed Mobility: Rolling Rolling: Total assist            Transfers                 General transfer comment: defer secondary to lethargy  Ambulation/Gait                 Stairs             Wheelchair Mobility    Modified Rankin (Stroke Patients Only)       Balance                                            Cognition Arousal/Alertness: Lethargic Behavior During Therapy: Flat affect Overall Cognitive Status: Difficult to assess                                 General Comments: pt with some mumbling responses but does very clearly get out, "that hurts" when repositioning head on pillow      Exercises Other Exercises Other Exercises: supine AAROM BLE x 10 - ankle pumps, heel slides, ab/add and slr    General Comments        Pertinent  Vitals/Pain Pain Assessment: No/denies pain Faces Pain Scale: Hurts even more Pain Location: neck Pain Descriptors / Indicators: Guarding;Moaning Pain Intervention(s): Repositioned;Limited activity within patient's tolerance;Monitored during session    Home Living                      Prior Function            PT Goals (current goals can now be found in the care plan section) Progress towards PT goals: Not progressing toward goals - comment    Frequency    Min 2X/week      PT Plan Current plan remains appropriate    Co-evaluation              AM-PAC PT "6 Clicks" Mobility   Outcome Measure  Help needed turning from your back to your side while in a flat bed without using bedrails?: A Lot Help needed moving from lying on your back to sitting on the side of a flat bed without using  bedrails?: A Lot Help needed moving to and from a bed to a chair (including a wheelchair)?: A Lot Help needed standing up from a chair using your arms (e.g., wheelchair or bedside chair)?: Total Help needed to walk in hospital room?: Total Help needed climbing 3-5 steps with a railing? : Total 6 Click Score: 9    End of Session Equipment Utilized During Treatment: Gait belt;Oxygen Activity Tolerance: Patient limited by lethargy;Patient limited by fatigue Patient left: in bed;with call bell/phone within reach;with bed alarm set;with nursing/sitter in room Nurse Communication: Mobility status       Time: 2574-9355 PT Time Calculation (min) (ACUTE ONLY): 8 min  Charges:  $Therapeutic Exercise: 8-22 mins                    Danielle Dess, PTA 07/20/20, 12:39 PM

## 2020-07-20 NOTE — Progress Notes (Signed)
Speech Language Pathology Treatment: Dysphagia  Patient Details Name: Joseph Owen MRN: [id number] DOB: 09/28/1951 Today's Date: 07/20/2020 Time: 2952-8413; 1310-1350 SLP Time Calculation (min) (ACUTE ONLY): 12 min; 40 min  Assessment / Plan / Recommendation Clinical Impression  Skilled treatment session #1 targeted pt's ongoing dysphagia goals. SLP faciltiated session by providing maximal multi-modal cues with the assistance of OT to promote arousal for oral intake. Pt's nurse and charge nurse were in pt's room for education. At rest, when speaking pt sounded like he had pharyngeal residue suggestive of decreased use of his swallowing musculature. Oral care provided by suction toothette.Pt able to sustain moments of arousal and as such he was able to perceive spoon at his mouth.  Pt had initial difficulty orally containing ice chips. Therefore nectar thick liquids via spoon were trialed with swift swallow response visualized and no overt s/s of aspiration over the course of consuming 2 oz. Pt also demonstrated swift swallow of puree with no oral residue post swallow.   At this time, would recommend conservative diet of dysphagia 1 with nectar thick liquids via spoon and medicine crushed in puree. Extensive education provided to pt's nurse on how to promote cognitive arousal to task when consuming POs. Oral care should be performed with use of suction before and after all PO intake. It is my hope that by providing scheduled opportunities for pt to cognitively engage with the task of eating, his cognition will also improve. Given that all of his chest x-ray have been clear, this appears to be a good bedside clinical decision.   Skilled treatment session #2 focused on pt's ongoing PO consumption. SLP facilitated session by providing less multi-modal cues that required during earlier session this morning. Improvements were pt had increased overall verbal interaction during consumption, able to follow  1 step directions related to himself, able to bring cup and spoon to his mouth with hand over hand assistance, able to respond to questions such as "what do you want next? Meat or mashed potatoes?" As a result, he consumed ~ 25% of lunch and 6 oz nectar thick liquids via cup in pt's hand with hand over assist. Nursing in to observe pt's increased PO consumption and increased mentation during task. Continue current plan of care.     HPI HPI:  Joseph Owen is a 69 y.o. male with a past history of chronic pain and GERD who is brought to the ED by EMS after being found unresponsive.  Family last talked to him on 07/13/2020,  but on 07/14/2020 they could not get him to answer the phone when they called so they asked EMS to do a wellness check.  EMS found the patient on the bathroom floor with a tub overflowing with water.  Initial temp was 86 degrees.  They placed the patient 15 liters nonrebreather and obtained an oxygen saturation of 92%.Pt intubated in the ED, transferred to CCU and extubated on 07/15/2020 at 1150. Chest x-ray and CT head are all negative. Etiology is unknown at this time.       SLP Plan  Continue with current plan of care       Recommendations  Diet recommendations: Dysphagia 1 (puree);Nectar-thick liquid Liquids provided via: Teaspoon Medication Administration: Crushed with puree Supervision: Full supervision/cueing for compensatory strategies;Trained caregiver to feed patient;Staff to assist with self feeding Compensations: Minimize environmental distractions;Slow rate;Small sips/bites Postural Changes and/or Swallow Maneuvers: Seated upright 90 degrees  Oral Care Recommendations: Oral care before and after PO (must use Suction with oral care) Follow up Recommendations:  (TBD) SLP Visit Diagnosis: Dysphagia, unspecified (R13.10) Plan: Continue with current plan of care       GO               [name] B. Dreama Saa M.S., CCC-SLP,  [facility] Speech-Language Pathologist Rehabilitation Services Office [phone]  [name] 07/20/2020, 3:39 PM

## 2020-07-20 NOTE — Care Management Important Message (Signed)
Important Message  Patient Details  Name: Joseph Owen MRN: [id number] Date of Birth: April 19, 1951   Medicare Important Message Given:  Yes     [name] 07/20/2020, 12:51 PM

## 2020-07-21 DIAGNOSIS — J9601 Acute respiratory failure with hypoxia: Secondary | ICD-10-CM | POA: Diagnosis not present

## 2020-07-21 DIAGNOSIS — N179 Acute kidney failure, unspecified: Secondary | ICD-10-CM | POA: Diagnosis not present

## 2020-07-21 LAB — BASIC METABOLIC PANEL
Anion gap: 8 (ref 5–15)
BUN: 16 mg/dL (ref 8–23)
CO2: 28 mmol/L (ref 22–32)
Calcium: 8.7 mg/dL — ABNORMAL LOW (ref 8.9–10.3)
Chloride: 109 mmol/L (ref 98–111)
Creatinine, Ser: 1 mg/dL (ref 0.61–1.24)
GFR calc Af Amer: >60 mL/min (ref >60)
GFR calc non Af Amer: >60 mL/min (ref >60)
Glucose, Bld: 98 mg/dL (ref 70–99)
Potassium: 3.4 mmol/L — ABNORMAL LOW (ref 3.5–5.1)
Sodium: 145 mmol/L (ref 135–145)

## 2020-07-21 LAB — GLUCOSE, CAPILLARY
Glucose-Capillary: 101 mg/dL — ABNORMAL HIGH (ref 70–99)
Glucose-Capillary: 102 mg/dL — ABNORMAL HIGH (ref 70–99)
Glucose-Capillary: 103 mg/dL — ABNORMAL HIGH (ref 70–99)
Glucose-Capillary: 104 mg/dL — ABNORMAL HIGH (ref 70–99)
Glucose-Capillary: 112 mg/dL — ABNORMAL HIGH (ref 70–99)
Glucose-Capillary: 121 mg/dL — ABNORMAL HIGH (ref 70–99)

## 2020-07-21 LAB — PHOSPHORUS: Phosphorus: 3.3 mg/dL (ref 2.5–4.6)

## 2020-07-21 LAB — CBC
HCT: 31 % — ABNORMAL LOW (ref 39.0–52.0)
Hemoglobin: 10.2 g/dL — ABNORMAL LOW (ref 13.0–17.0)
MCH: 33.1 pg (ref 26.0–34.0)
MCHC: 32.9 g/dL (ref 30.0–36.0)
MCV: 100.6 fL — ABNORMAL HIGH (ref 80.0–100.0)
Platelets: 165 10*3/uL (ref 150–400)
RBC: 3.08 MIL/uL — ABNORMAL LOW (ref 4.22–5.81)
RDW: 13.1 % (ref 11.5–15.5)
WBC: 11.4 10*3/uL — ABNORMAL HIGH (ref 4.0–10.5)
nRBC: 0 % (ref 0.0–0.2)

## 2020-07-21 LAB — CSF CULTURE W GRAM STAIN
Culture: NO GROWTH
Gram Stain: NONE SEEN

## 2020-07-21 LAB — MAGNESIUM: Magnesium: 1.6 mg/dL — ABNORMAL LOW (ref 1.7–2.4)

## 2020-07-21 LAB — PROCALCITONIN: Procalcitonin: <0.1 ng/mL

## 2020-07-21 MED ORDER — THIAMINE HCL 100 MG PO TABS: 100.0000 mg | ORAL_TABLET | Freq: Every day | ORAL | Status: DC

## 2020-07-21 MED ORDER — LOSARTAN POTASSIUM 25 MG PO TABS
25.0000 mg | ORAL_TABLET | Freq: Every day | ORAL | Status: DC
  Administered 2020-07-21 – 2020-07-30 (×10): 25 mg via ORAL
  Filled 2020-07-21 (×10): qty 1

## 2020-07-21 MED ORDER — FOLIC ACID 1 MG PO TABS
1.0000 mg | ORAL_TABLET | Freq: Every day | ORAL | Status: DC
  Administered 2020-07-22 – 2020-07-30 (×9): 1 mg via ORAL
  Filled 2020-07-21 (×9): qty 1

## 2020-07-21 MED ORDER — POTASSIUM CHLORIDE CRYS ER 20 MEQ PO TBCR
20.0000 meq | EXTENDED_RELEASE_TABLET | Freq: Once | ORAL | Status: AC
  Administered 2020-07-21: 20 meq via ORAL
  Filled 2020-07-21: qty 1

## 2020-07-21 MED ORDER — MAGNESIUM SULFATE 2 GM/50ML IV SOLN
2.0000 g | Freq: Once | INTRAVENOUS | Status: AC
  Administered 2020-07-21: 2 g via INTRAVENOUS
  Filled 2020-07-21: qty 50

## 2020-07-21 MED ORDER — ESCITALOPRAM OXALATE 10 MG PO TABS
5.0000 mg | ORAL_TABLET | Freq: Every day | ORAL | Status: DC
  Administered 2020-07-21 – 2020-07-30 (×10): 5 mg via ORAL
  Filled 2020-07-21 (×10): qty 0.5

## 2020-07-21 NOTE — Progress Notes (Signed)
PHARMACY CONSULT NOTE - FOLLOW UP  Pharmacy Consult for Electrolyte Monitoring and Replacement   Recent Labs: Potassium (mmol/L)  Date Value  07/21/2020 3.4 (L)   Magnesium (mg/dL)  Date Value  00/37/0488 1.6 (L)   Calcium (mg/dL)  Date Value  89/16/9450 8.7 (L)   Albumin (g/dL)  Date Value  38/88/2800 3.8   Phosphorus (mg/dL)  Date Value  34/91/7915 3.3   Sodium (mmol/L)  Date Value  07/21/2020 145     Assessment: 69 year old male admitted for respiratory failure requiring intubation. Patient was intubated 9/21 and extubated 9/22, remains somnolent and NPO as a result and placed on NG tube. Patient with electrolyte abnormalities, pharmacy consulted to replace. Na trending up, MD increased free water   9/27 NG tubed removed and patient participated in swallow evaluation. Patient diet advance from NPO to dysphagia diet (puree) and is able to tolerate PO meds.   Goal of Therapy:  Electrolytes WNL  Plan:  K 3.4 - will give Kcl PO x1 Mg 1.6 - will give Mg sulfate 2g IV x1 Continue to monitor electrolytes with AM labs.   Raiford Noble, PharmD Pharmacy Resident  07/21/2020 7:18 AM

## 2020-07-21 NOTE — Progress Notes (Signed)
Occupational Therapy Treatment Patient Details Name: Joseph Owen MRN: [id number] DOB: 17-Apr-1951 Today's Date: 07/21/2020    History of present illness 69 yo male with onset of sybcopal episode at home was found by EMS, was hypothermic, hypoxic, in AKI and in hypercapnic resp failure.  Intubated, extubated, found to have encephalopathy, cleared for seizures, had tachycardia and had benzodiazepine in his system.  At home was falling frequently, has been depressed and had summer incident of [facility] admit for suicide attempt.  PMHx:  foot drop, chronic pain, GERD, CKD 3   OT comments  Upon entering the room, pt supine in bed and sleeping soundly. OT provided total A for hand over hand to wash face and pt placed into chair position in bed in an effort to increase level of alertness. Pt opens eyes a few times but mostly when he reports B LEs are painful. RN notified. OT provided total A for oral hygiene with use of suction brush and pt noted to be pocketing food in L cheek. OT offered pt thickened water and magic cup and pt clearly requesting magic cup this session. Hand over hand for self feeding with utensil to mouth with pt opening mouth and taking bites/sips. Cup placed in hand , in same manner, and several sips occurring. OT suctioning for any additional residual at end of session. Pt reaching towards B LEs and reports pain with RN notified. OT repositioning pt for comfort with total A. B mitts removed when therapist entered room and checked with staff and plan to leave off. Pt continues to benefit from OT intervention. Pt will need short term rehab stay before returning home.   Follow Up Recommendations  SNF    Equipment Recommendations  Other (comment) (defer to next venue of care)       Precautions / Restrictions Precautions Precautions: Fall       Mobility Bed Mobility Overal bed mobility: Needs Assistance Bed Mobility: Rolling Rolling: Total assist      Transfers         General transfer comment: defer secondary to lethargy        ADL either performed or assessed with clinical judgement   ADL Overall ADL's : Needs assistance/impaired     Grooming: Wash/dry hands;Oral care;Total assistance;Bed level Grooming Details (indicate cue type and reason): hand over hand assistance                     Cognition Arousal/Alertness: Lethargic Behavior During Therapy: Flat affect Overall Cognitive Status: Difficult to assess                     Pertinent Vitals/ Pain       Pain Assessment: Faces Faces Pain Scale: Hurts even more Pain Location: B LEs Pain Descriptors / Indicators: Guarding;Moaning Pain Intervention(s): Limited activity within patient's tolerance;Monitored during session;Repositioned         Frequency  Min 1X/week        Progress Toward Goals  OT Goals(current goals can now be found in the care plan section)  Progress towards OT goals: Progressing toward goals  Acute Rehab OT Goals Patient Stated Goal: none stated OT Goal Formulation: Patient unable to participate in goal setting Time For Goal Achievement: 07/31/20 Potential to Achieve Goals: Fair  Plan Discharge plan remains appropriate       AM-PAC OT "6 Clicks" Daily Activity     Outcome Measure   Help from another person eating meals?: Total Help from another  person taking care of personal grooming?: Total Help from another person toileting, which includes using toliet, bedpan, or urinal?: Total Help from another person bathing (including washing, rinsing, drying)?: Total Help from another person to put on and taking off regular upper body clothing?: Total Help from another person to put on and taking off regular lower body clothing?: Total 6 Click Score: 6    End of Session    OT Visit Diagnosis: Muscle weakness (generalized) (M62.81);Other symptoms and signs involving cognitive function   Activity Tolerance Patient limited by lethargy   Patient  Left in bed;with call bell/phone within reach;with bed alarm set   Nurse Communication Mobility status (self feeding, pocketing, oral care)        Time: 1020-1047 OT Time Calculation (min): 27 min  Charges: OT General Charges $OT Visit: 1 Visit OT Treatments $Self Care/Home Management : 23-37 mins  Jackquline Denmark, MS, OTR/L , CBIS ascom [phone]  07/21/20, 12:28 PM

## 2020-07-21 NOTE — Progress Notes (Signed)
PROGRESS NOTE  PCCM transfer.  Joseph Owen  LDJ:[id number] DOB: Nov 25, 1950 DOA: 07/14/2020 PCP: Jaclyn Shaggy, MD   Brief Narrative: Taken from H&P.  Patient is unable to provide much detail. Joseph Owen is a 69 y.o. male with a past history of chronic pain, depression chronic history of frequent fall and unsteadiness which has been worked up by neurologist in the past, alcohol abuse and GERD(found to have some esophageal stenosis which was dilated in July 2021) presented to ED after being found unresponsive on the bathroom floor with the tub overflowing with water.  Patient lives alone and family last talked with him 24 hours ago.  Unable to obtained significant history.  On arrival he was significantly hypoxic and unresponsive requiring emergent intubation.  Extubated on 07/15/2020 and care was transitioned to [facility] on 07/16/2020.  EKG with sinus tachycardia but was without any significant ST changes.  On arrival labs are positive for leukocytosis with WBC count of 27.7 and neutrophils of 24.9, no obvious source of infection, may be secondary to stress with fall and being unresponsive, multiple electrolyte abnormalities which include hypokalemia, hypomagnesemia and hypophosphatemia.  AKI with creatinine of 1.5.  Baseline around 1.1-1.2.  Lactic acid of 4-never checked again.  No record of any troponin checked.  Urine and blood cultures negative.  Toxicology only positive for benzos in UDS.  CK elevated at 1100.  CT head and cervical spine was without any acute abnormality.  Echocardiogram with normal EF and no wall motion abnormalities.  Apparently patient has an history of severe depression and he was admitted at behavioral health with suicidal attempt over the summer.  Sepsis was noted in ED note but ruled out as there is no source of infection. All cultures include CSF was negative.  EEG with severe encephalopathy and no seizure-like activity.  Confabulations noted after couple of days  when he was little more awake.  Started on high-dose thiamine for concern of Wernicke's/Korsakoff encephalopathy.  Subjective: Patient continued to feel lethargic.  Resting comfortably during morning rounds.  Able to eat his breakfast with the help per nursing staff.  No other acute concerns.  Assessment & Plan:   Active Problems:   Respiratory failure (HCC)   Glasgow coma scale total score 3-8 (HCC)   AKI (acute kidney injury) (HCC)   Swelling of arm   Altered mental status   Major depressive disorder, recurrent episode, moderate (HCC)  Acute hypoxic respiratory failure.  Resolved  Unknown etiology.  Toxicology was pretty much negative except benzos in UDS.  Patient has an history of suicidal attempt secondary to severe depression and was admitted in behavioral health over the summer.  Not sure whether urine was obtained after he received medications for intubation. Patient is extubated now and was saturating well on room air.  Encephalopathy/severe depression. Wernicke's/Korsakoff.  On chart review he has an history of frequent falls and unsteadiness for many years and also being evaluated by a neurologist with no diagnosis and was referred for physical therapy. Neurology was consulted and they signed off as imaging was negative for any acute changes.EEG -shows diffuse encephalopathy.  Urine and blood cultures remain negative.  Repeat chest x-ray was without any acute abnormality.  CSF studies are inconclusive and cultures are negative. Troponin was positive at 60 and decreased to 55 on repeat check.  Consistent with demand only.  No lactic acidosis. Just had one episode of low-grade fever which resolved without intervention and leukocytosis improved without any antibiotics. Psych evaluation  was obtained to rule out exacerbation of his underlying depression.  Patient has a prior history of hospitalization due to severe depression. After 4 days he become little more alert and started  talking which was consistent with confabulations.-Concern of Wernicke's/Korsakoff because of his history of alcohol abuse.  Patient was already receiving thiamine 100 mg daily since admission. -Start him on high-dose thiamine 500 mg 3 times a day for 2 days, started on 07/19/2020.  followed by 250 mg daily for another 5 days-start on 07/21/2020. -Continue to monitor. -I will hold off to antibiotics as fever resolved without any intervention, leukocytosis resolved. -PT/OT evaluation are recommending SNF placement-TOC consult once complete thiamine treatment and little more alert.  Continue to need to person for feeds.  Tube feeding.  Discontinued on 07/20/2020. NG tube was placed after 4 days of hospitalization with continuation of severe encephalopathy and unable to take any p.o. he did receive 2 feet for 2 days.   -Swallow evaluation is recommending dysphagia diet. He was also able to participate with PT and OT evaluation today-they're recommending SNF placement.  Sepsis ruled out as there is no obvious source of infection and leukocytosis resolved.  Elevated troponin.  EKG without any acute changes.  Barely positive troponin most likely secondary to demand with a flat curve.  Electrolyte abnormalities.  Mild hypokalemia with potassium of 3.4 and magnesium of 1.6 today. Patient found to have multiple electrolyte abnormalities which includes hypokalemia, hypomagnesemia and hypophosphatemia.  Did develop some hypernatremia secondary to poor p.o. intake being managed initially with D5 and then later with free water through tube feed. -Being managed by pharmacy. -Replete as needed and monitor.  Right upper extremity edema.  Venous Doppler obtained yesterday for the concern of right upper extremity edema which was negative for DVT.  AKI with CKD stage IIIa.  Resolved with IV hydration.  Creatinine below 1 now. -Continue to monitor. -Avoid nephrotoxins.  Depression.  His current hospitalization  might be secondary to exacerbation of his severe depression. -Psych was consulted -recommending starting Lexapro and there is no need for inpatient behavioral health. -Continue home dose of Remeron-restarted today.  Objective: Vitals:   07/21/20 0042 07/21/20 0416 07/21/20 0801 07/21/20 1143  BP: (!) 157/82 (!) 141/75 (!) 154/79 (!) 151/73  Pulse: 73 74 77 78  Resp: 20 17 16 20   Temp: 97.8 F (36.6 C) 97.8 F (36.6 C) 99.8 F (37.7 C) (!) 100.8 F (38.2 C)  TempSrc: Oral Oral Oral Oral  SpO2: 97% 94% 97% 97%  Weight:      Height:        Intake/Output Summary (Last 24 hours) at 07/21/2020 1554 Last data filed at 07/21/2020 1509 Gross per 24 hour  Intake 403.12 ml  Output 2650 ml  Net -2246.88 ml   Filed Weights   07/17/20 0414 07/18/20 0337 07/20/20 0535  Weight: 81 kg 84.2 kg 84.7 kg    Examination:  General.  Chronically ill-appearing gentleman, in no acute distress. Pulmonary.  Lungs clear bilaterally, normal respiratory effort. CV.  Regular rate and rhythm, no JVD, rub or murmur. Abdomen.  Soft, nontender, nondistended, BS positive. CNS.  Alert and oriented x3.  No focal neurologic deficit. Extremities. B/L UE edema, no cyanosis, pulses intact and symmetrical. Psychiatry.  Judgment and insight appears normal.  DVT prophylaxis: Lovenox Code Status: Full Family Communication: Daughter 07/22/20 was updated on phone.  She is agreeable with SNF plan.  She will try to visit him in the next couple of days. Patient  lives alone, is divorced.   Disposition Plan:  Status is: Inpatient  Remains inpatient appropriate because:Inpatient level of care appropriate due to severity of illness   Dispo: The patient is from: Home              Anticipated d/c is to: To be determined              Anticipated d/c date is: 3 days              Patient currently is not medically stable to d/c.  Consultants:   PCCM  Neurology  Psychiatry.  Procedures:  Antimicrobials:   Data  Reviewed: I have personally reviewed following labs and imaging studies  CBC: Recent Labs  Lab 07/15/20 0427 07/16/20 0726 07/18/20 0525 07/20/20 0531 07/21/20 0351  WBC 18.4* 18.6* 10.5 11.0* 11.4*  NEUTROABS  --  16.6*  --   --   --   HGB 11.1* 11.1* 10.5* 10.7* 10.2*  HCT 30.0* 31.9* 30.0* 32.6* 31.0*  MCV 92.9 95.8 98.7 103.2* 100.6*  PLT 154 157 167 157 165   Basic Metabolic Panel: Recent Labs  Lab 07/16/20 0726 07/16/20 0726 07/17/20 0551 07/18/20 0525 07/19/20 0518 07/20/20 0531 07/21/20 0351  NA 136   < > 140 143 146* 147* 145  K 3.1*   < > 3.8 3.7 3.6 3.5 3.4*  CL 102   < > 106 108 111 109 109  CO2 26   < > GLUCOSE 114*   < > 129* 117* 104* 119* 98  BUN 24*   < > CREATININE 1.29*   < > 0.98 0.92 0.86 0.98 1.00  CALCIUM 8.0*   < > 8.2* 8.1* 8.7* 8.8* 8.7*  MG 1.3*  --  2.5* 1.9 2.0  --  1.6*  PHOS 2.1*  --  1.7* 2.5  --  2.4* 3.3   < > = values in this interval not displayed.   GFR: Estimated Creatinine Clearance: 75.4 mL/min (by C-G formula based on SCr of 1 mg/dL). Liver Function Tests: No results for input(s): AST, ALT, ALKPHOS, BILITOT, PROT, ALBUMIN in the last 168 hours. No results for input(s): LIPASE, AMYLASE in the last 168 hours. Recent Labs  Lab 07/17/20 1702  AMMONIA 11   Coagulation Profile: No results for input(s): INR, PROTIME in the last 168 hours. Cardiac Enzymes: Recent Labs  Lab 07/16/20 1609  CKTOTAL 693*   BNP (last 3 results) No results for input(s): PROBNP in the last 8760 hours. HbA1C: No results for input(s): HGBA1C in the last 72 hours. CBG: Recent Labs  Lab 07/20/20 2200 07/20/20 2335 07/21/20 0418 07/21/20 0801 07/21/20 1200  GLUCAP 96 104* 112* 101* 103*   Lipid Profile: No results for input(s): CHOL, HDL, LDLCALC, TRIG, CHOLHDL, LDLDIRECT in the last 72 hours. Thyroid Function Tests: No results for input(s): TSH, T4TOTAL, FREET4, T3FREE, THYROIDAB in the last 72  hours. Anemia Panel: No results for input(s): VITAMINB12, FOLATE, FERRITIN, TIBC, IRON, RETICCTPCT in the last 72 hours. Sepsis Labs: Recent Labs  Lab 07/16/20 1609 07/16/20 1847 07/21/20 0351  PROCALCITON  --   --  <0.10  LATICACIDVEN 1.4 1.1  --     Recent Results (from the past 240 hour(s))  Blood Culture (routine x 2)     Status: None   Collection Time: 07/14/20  1:41 PM   Specimen: BLOOD  Result Value Ref Range Status   Specimen Description BLOOD  BLOOD RIGHT FOREARM  Final   Special Requests   Final    BOTTLES DRAWN AEROBIC AND ANAEROBIC Blood Culture adequate volume   Culture   Final    NO GROWTH 5 DAYS Performed at [facility], [address] [place], [place] [address]    Report Status 07/19/2020 FINAL  Final  Blood Culture (routine x 2)     Status: None   Collection Time: 07/14/20  1:41 PM   Specimen: BLOOD  Result Value Ref Range Status   Specimen Description BLOOD BLOOD RIGHT HAND  Final   Special Requests   Final    BOTTLES DRAWN AEROBIC AND ANAEROBIC Blood Culture adequate volume   Culture   Final    NO GROWTH 5 DAYS Performed at [facility], [address] [place], [place] [address]    Report Status 07/19/2020 FINAL  Final  Urine culture     Status: None   Collection Time: 07/14/20  1:41 PM   Specimen: In/Out Cath Urine  Result Value Ref Range Status   Specimen Description   Final    IN/OUT CATH URINE Performed at [facility], [address], [place], [place] [address]    Special Requests   Final    NONE Performed at [facility], [address], [place], [place] [address]    Culture   Final    NO GROWTH Performed at [facility], [address] [place] [address], [place], [place] [address]    Report Status 07/15/2020 FINAL  Final  SARS Coronavirus 2 by RT PCR (hospital order, performed in [facility] hospital lab) Nasopharyngeal Nasopharyngeal Swab     Status: None   Collection Time: 07/14/20  1:41 PM   Specimen:  Nasopharyngeal Swab  Result Value Ref Range Status   SARS Coronavirus 2 NEGATIVE NEGATIVE Final    Comment: (NOTE) SARS-CoV-2 target nucleic acids are NOT DETECTED.  The SARS-CoV-2 RNA is generally detectable in upper and lower respiratory specimens during the acute phase of infection. The lowest concentration of SARS-CoV-2 viral copies this assay can detect is 250 copies / mL. A negative result does not preclude SARS-CoV-2 infection and should not be used as the sole basis for treatment or other patient management decisions.  A negative result may occur with improper specimen collection / handling, submission of specimen other than nasopharyngeal swab, presence of viral mutation(s) within the areas targeted by this assay, and inadequate number of viral copies (<250 copies / mL). A negative result must be combined with clinical observations, patient history, and epidemiological information.  Fact Sheet for Patients:   BoilerBrush.com.cy  Fact Sheet for Healthcare Providers: https://pope.com/  This test is not yet approved or  cleared by the [place] FDA and has been authorized for detection and/or diagnosis of SARS-CoV-2 by FDA under an Emergency Use Authorization (EUA).  This EUA will remain in effect (meaning this test can be used) for the duration of the COVID-19 declaration under Section 564(b)(1) of the Act, 21 U.S.C. section 360bbb-3(b)(1), unless the authorization is terminated or revoked sooner.  Performed at [facility], [address] [place], [place] [address]   CSF culture     Status: None   Collection Time: 07/17/20  4:00 PM   Specimen: PATH Cytology CSF; Cerebrospinal Fluid  Result Value Ref Range Status   Specimen Description   Final    CSF Performed at [facility], [address] [place], [place] [address]    Special Requests  Final    CSF Performed at [facility], [address] [place], [place] [address]    Gram Stain   Final    NO ORGANISMS SEEN FEW RED BLOOD CELLS RARE WBC SEEN Performed at [facility], [address] [place], [place] [address]    Culture   Final    NO GROWTH 3 DAYS Performed at [facility], [address] [place], [place] [address]    Report Status 07/21/2020 FINAL  Final  Culture, fungus without smear     Status: None (Preliminary result)   Collection Time: 07/17/20  4:00 PM   Specimen: PATH Cytology CSF; Cerebrospinal Fluid  Result Value Ref Range Status   Specimen Description   Final    CSF Performed at [facility], [address] [place], [place] [address]    Special Requests   Final    CSF Performed at [facility], [address] [place], [place] [address]    Culture   Final    No Fungi Isolated in 4 Weeks Performed at [facility], [address] [place], [place] [address]    Report Status PENDING  Incomplete     Radiology Studies: No results found.  Scheduled Meds: . Chlorhexidine Gluconate Cloth  6 each Topical Daily  . enoxaparin (LOVENOX) injection  40 mg Subcutaneous Q24H  . escitalopram  5 mg Oral Daily  . [START ON 07/22/2020] folic acid  1 mg Oral Daily  . gabapentin  100 mg Oral Q8H  . losartan  25 mg Oral Daily  . mouth rinse  15 mL Mouth Rinse BID  . mirtazapine  15 mg Oral QHS  . multivitamin with minerals  1 tablet Oral Daily  . nicotine  14 mg Transdermal Daily  . pantoprazole sodium  20 mg Oral Daily  . sodium chloride flush  10-40 mL Intracatheter Q12H  . sodium chloride flush  3 mL Intravenous Q12H  . [START ON 07/27/2020] thiamine  100 mg Oral Daily   Continuous Infusions: . sodium chloride Stopped (07/20/20 2221)  . famotidine (PEPCID) IV 20 mg (07/21/20 0940)  . [START ON 07/22/2020] thiamine injection       LOS: 7 days   Time spent: 25 minutes.  Arnetha CourserSumayya Lunden Stieber, MD [facility]  If 7PM-7AM, please contact  night-coverage [email or web address]  07/21/2020, 3:54 PM   This record has been created using Conservation officer, historic buildingsDragon voice recognition software. Errors have been sought and corrected,but may not always be located. Such creation errors do not reflect on the standard of care.

## 2020-07-22 ENCOUNTER — Inpatient Hospital Stay: Payer: Medicare Other

## 2020-07-22 DIAGNOSIS — R400 Somnolence: Secondary | ICD-10-CM | POA: Diagnosis not present

## 2020-07-22 DIAGNOSIS — F331 Major depressive disorder, recurrent, moderate: Secondary | ICD-10-CM | POA: Diagnosis not present

## 2020-07-22 DIAGNOSIS — M7989 Other specified soft tissue disorders: Secondary | ICD-10-CM | POA: Diagnosis not present

## 2020-07-22 LAB — OLIGOCLONAL BANDS, CSF + SERM

## 2020-07-22 LAB — BASIC METABOLIC PANEL
Anion gap: 10 (ref 5–15)
BUN: 18 mg/dL (ref 8–23)
CO2: 29 mmol/L (ref 22–32)
Calcium: 8.7 mg/dL — ABNORMAL LOW (ref 8.9–10.3)
Chloride: 108 mmol/L (ref 98–111)
Creatinine, Ser: 1.01 mg/dL (ref 0.61–1.24)
GFR calc Af Amer: >60 mL/min (ref >60)
GFR calc non Af Amer: >60 mL/min (ref >60)
Glucose, Bld: 105 mg/dL — ABNORMAL HIGH (ref 70–99)
Potassium: 3.7 mmol/L (ref 3.5–5.1)
Sodium: 147 mmol/L — ABNORMAL HIGH (ref 135–145)

## 2020-07-22 LAB — GLUCOSE, CAPILLARY
Glucose-Capillary: 100 mg/dL — ABNORMAL HIGH (ref 70–99)
Glucose-Capillary: 100 mg/dL — ABNORMAL HIGH (ref 70–99)
Glucose-Capillary: 103 mg/dL — ABNORMAL HIGH (ref 70–99)
Glucose-Capillary: 130 mg/dL — ABNORMAL HIGH (ref 70–99)
Glucose-Capillary: 99 mg/dL (ref 70–99)

## 2020-07-22 LAB — PHOSPHORUS: Phosphorus: 3.2 mg/dL (ref 2.5–4.6)

## 2020-07-22 LAB — MAGNESIUM: Magnesium: 1.8 mg/dL (ref 1.7–2.4)

## 2020-07-22 MED ORDER — ACETAMINOPHEN 650 MG RE SUPP: 650.0000 mg | RECTAL | Status: DC | PRN

## 2020-07-22 MED ORDER — FAMOTIDINE 20 MG PO TABS
20.0000 mg | ORAL_TABLET | Freq: Two times a day (BID) | ORAL | Status: DC
  Administered 2020-07-22 – 2020-07-30 (×16): 20 mg via ORAL
  Filled 2020-07-22 (×16): qty 1

## 2020-07-22 NOTE — Progress Notes (Signed)
PHARMACY CONSULT NOTE - FOLLOW UP  Pharmacy Consult for Electrolyte Monitoring and Replacement   Recent Labs: Potassium (mmol/L)  Date Value  07/22/2020 3.7   Magnesium (mg/dL)  Date Value  11/55/2080 1.8   Calcium (mg/dL)  Date Value  22/33/6122 8.7 (L)   Albumin (g/dL)  Date Value  44/97/5300 3.8   Phosphorus (mg/dL)  Date Value  51/07/2110 3.2   Sodium (mmol/L)  Date Value  07/22/2020 147 (H)   Corr Ca: 8.9  Assessment: 69 year old male admitted for respiratory failure requiring intubation. PMH significant for chronic pain, depression, frequent falls, GERD, and hx of alcohol abuse. Patient was intubated 9/21 and extubated 9/22, remains somnolent and NPO as a result and placed on NG tube. Patient with electrolyte abnormalities, pharmacy consulted to replace.   9/27 NG tubed removed and patient participated in swallow evaluation. Patient diet advance from NPO to dysphagia diet (puree) and is able to tolerate PO meds.   Na slightly elevated at 147 after free water was discontinued. Likely from low PO intake.   Goal of Therapy:  Electrolytes WNL  Plan:  Electrolytes WNL - no replacement needed at this time Monitor Na, if continues to uptrend, will recommend adding free water back.  Continue to monitor electrolytes with AM labs.   Joseph Owen, PharmD Pharmacy Resident  07/22/2020 6:44 AM

## 2020-07-22 NOTE — Progress Notes (Signed)
Progress Note    Joseph Owen  ZOX:[id number] DOB: 02-18-1951  DOA: 07/14/2020 PCP: Jaclyn Shaggy, MD      Brief Narrative:    Medical records reviewed and are as summarized below:  Joseph Owen is a 69 y.o. male  past history of chronic pain, depression chronic history of frequent fall and unsteadiness which has been worked up by neurologist in the past, alcohol abuse and GERD(found to have some esophageal stenosis which was dilated in July 2021) presented to ED after being found unresponsive on the bathroom floor with the tub overflowing with water.      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    PLAN  Obtain chest x-ray for evaluation of recurrent fever We will consider blood cultures if fever persists or recurs Replete potassium and magnesium as needed Continue antidepressants Continue antihypertensives   Diet Order            DIET - DYS 1 Room service appropriate? Yes; Fluid consistency: Nectar Thick  Diet effective now                    Consultants:  Intensivist  Neurologist  Psychiatrist  Procedures:      Medications:   . Chlorhexidine Gluconate Cloth  6 each Topical Daily  . enoxaparin (LOVENOX) injection  40 mg Subcutaneous Q24H  . escitalopram  5 mg Oral Daily  . famotidine  20 mg Oral BID  . folic acid  1 mg Oral Daily  . gabapentin  100 mg Oral Q8H  .  losartan  25 mg Oral Daily  . mouth rinse  15 mL Mouth Rinse BID  . mirtazapine  15 mg Oral QHS  . multivitamin with minerals  1 tablet Oral Daily  . nicotine  14 mg Transdermal Daily  . pantoprazole sodium  20 mg Oral Daily  . sodium chloride flush  10-40 mL Intracatheter Q12H  . sodium chloride flush  3 mL Intravenous Q12H  . [START ON 07/27/2020] thiamine  100 mg Oral Daily   Continuous Infusions: . sodium chloride 250 mL (07/21/20 2050)  . thiamine injection 250 mg (07/22/20 1125)     Anti-infectives (From admission, onward)   Start     Dose/Rate Route Frequency Ordered Stop   07/14/20 1445  vancomycin (VANCOREADY) IVPB 750 mg/150 mL        750 mg 150 mL/hr over 60 Minutes Intravenous  Once 07/14/20 1335 07/14/20 1553   07/14/20 1330  ceFEPIme (MAXIPIME) 2 g in sodium chloride 0.9 % 100 mL IVPB        2 g 200 mL/hr over 30 Minutes Intravenous  Once 07/14/20 1328 07/14/20 1444   07/14/20 1330  metroNIDAZOLE (FLAGYL) IVPB 500 mg        500 mg 100 mL/hr over 60 Minutes Intravenous  Once 07/14/20 1328 07/14/20 1600   07/14/20 1330  vancomycin (VANCOCIN) IVPB 1000 mg/200 mL premix  Status:  Discontinued  1,000 mg 200 mL/hr over 60 Minutes Intravenous  Once 07/14/20 1328 07/17/20 0952             Family Communication/Anticipated D/C date and plan/Code Status   DVT prophylaxis: enoxaparin (LOVENOX) injection 40 mg Start: 07/18/20 2200 Place and maintain sequential compression device Start: 07/17/20 1350 SCDs Start: 07/14/20 1508     Code Status: Full Code  Family Communication:  Disposition Plan:    Status is: Inpatient  Remains inpatient appropriate because:Altered mental status and Unsafe d/c plan   Dispo:  Patient From: Home  Planned Disposition: Skilled Nursing Facility  Expected discharge date: 07/25/20  Medically stable for discharge: No            Subjective:   He is lethargic and unable to provide any history.  Interval events  noted.  He had a fever with T-max of 100.6 in the early hours of the morning.  Objective:    Vitals:   07/22/20 0025 07/22/20 0351 07/22/20 0749 07/22/20 1220  BP:  140/70 137/67 138/67  Pulse:  91 72 77  Resp:  19 18 17   Temp: 99.8 F (37.7 C) (!) 100.6 F (38.1 C) 99.5 F (37.5 C) 98.1 F (36.7 C)  TempSrc: Axillary Axillary Oral   SpO2:  95% 94% 98%  Weight:  79.8 kg    Height:       No data found.   Intake/Output Summary (Last 24 hours) at 07/22/2020 1239 Last data filed at 07/22/2020 0358 Gross per 24 hour  Intake 120 ml  Output 1700 ml  Net -1580 ml   Filed Weights   07/18/20 0337 07/20/20 0535 07/22/20 0351  Weight: 84.2 kg 84.7 kg 79.8 kg    Exam:  GEN: NAD SKIN: Warm and dry EYES: EOMI ENT: MMM CV: RRR PULM: CTA B ABD: soft, ND, NT, +BS CNS: Lethargic and nonverbal, he does not follow commands in all 4 limbs appear flaccid EXT: Significant bilateral upper extremity edema, no tenderness.  Trace edema in bilateral legs   Data Reviewed:   I have personally reviewed following labs and imaging studies:  Labs: Labs show the following:   Basic Metabolic Panel: Recent Labs  Lab 07/17/20 0551 07/17/20 0551 07/18/20 0525 07/18/20 0525 07/19/20 0518 07/19/20 0518 07/20/20 0531 07/20/20 0531 07/21/20 0351 07/22/20 0459  NA 140   < > 143  --  146*  --  147*  --  145 147*  K 3.8   < > 3.7   < > 3.6   < > 3.5   < > 3.4* 3.7  CL 106   < > 108  --  111  --  109  --  109 108  CO2 26   < > 26  --  28  --  29  --  28 29  GLUCOSE 129*   < > 117*  --  104*  --  119*  --  98 105*  BUN 15   < > 14  --  14  --  15  --  16 18  CREATININE 0.98   < > 0.92  --  0.86  --  0.98  --  1.00 1.01  CALCIUM 8.2*   < > 8.1*  --  8.7*  --  8.8*  --  8.7* 8.7*  MG 2.5*  --  1.9  --  2.0  --   --   --  1.6* 1.8  PHOS 1.7*  --  2.5  --   --   --  2.4*  --  3.3 3.2   < > = values in this interval not displayed.   GFR Estimated Creatinine Clearance: 74.7 mL/min (by C-G  formula based on SCr of 1.01 mg/dL). Liver Function Tests: No results for input(s): AST, ALT, ALKPHOS, BILITOT, PROT, ALBUMIN in the last 168 hours. No results for input(s): LIPASE, AMYLASE in the last 168 hours. Recent Labs  Lab 07/17/20 1702  AMMONIA 11   Coagulation profile No results for input(s): INR, PROTIME in the last 168 hours.  CBC: Recent Labs  Lab 07/16/20 0726 07/18/20 0525 07/20/20 0531 07/21/20 0351  WBC 18.6* 10.5 11.0* 11.4*  NEUTROABS 16.6*  --   --   --   HGB 11.1* 10.5* 10.7* 10.2*  HCT 31.9* 30.0* 32.6* 31.0*  MCV 95.8 98.7 103.2* 100.6*  PLT 157 167 157 165   Cardiac Enzymes: Recent Labs  Lab 07/16/20 1609  CKTOTAL 693*   BNP (last 3 results) No results for input(s): PROBNP in the last 8760 hours. CBG: Recent Labs  Lab 07/21/20 1200 07/21/20 1641 07/21/20 2049 07/21/20 2345 07/22/20 0751  GLUCAP 103* 102* 121* 104* 103*   D-Dimer: No results for input(s): DDIMER in the last 72 hours. Hgb A1c: No results for input(s): HGBA1C in the last 72 hours. Lipid Profile: No results for input(s): CHOL, HDL, LDLCALC, TRIG, CHOLHDL, LDLDIRECT in the last 72 hours. Thyroid function studies: No results for input(s): TSH, T4TOTAL, T3FREE, THYROIDAB in the last 72 hours.  Invalid input(s): FREET3 Anemia work up: No results for input(s): VITAMINB12, FOLATE, FERRITIN, TIBC, IRON, RETICCTPCT in the last 72 hours. Sepsis Labs: Recent Labs  Lab 07/16/20 0726 07/16/20 1609 07/16/20 1847 07/18/20 0525 07/20/20 0531 07/21/20 0351  PROCALCITON  --   --   --   --   --  <0.10  WBC 18.6*  --   --  10.5 11.0* 11.4*  LATICACIDVEN  --  1.4 1.1  --   --   --     Microbiology Recent Results (from the past 240 hour(s))  Blood Culture (routine x 2)     Status: None   Collection Time: 07/14/20  1:41 PM   Specimen: BLOOD  Result Value Ref Range Status   Specimen Description BLOOD BLOOD RIGHT FOREARM  Final   Special Requests   Final    BOTTLES DRAWN  AEROBIC AND ANAEROBIC Blood Culture adequate volume   Culture   Final    NO GROWTH 5 DAYS Performed at [facility], [address] [place], [place] [address]    Report Status 07/19/2020 FINAL  Final  Blood Culture (routine x 2)     Status: None   Collection Time: 07/14/20  1:41 PM   Specimen: BLOOD  Result Value Ref Range Status   Specimen Description BLOOD BLOOD RIGHT HAND  Final   Special Requests   Final    BOTTLES DRAWN AEROBIC AND ANAEROBIC Blood Culture adequate volume   Culture   Final    NO GROWTH 5 DAYS Performed at [facility], [address] [place], [place] [address]    Report Status 07/19/2020 FINAL  Final  Urine culture     Status: None   Collection Time: 07/14/20  1:41 PM   Specimen: In/Out Cath Urine  Result Value Ref Range Status   Specimen Description   Final    IN/OUT CATH URINE Performed at [facility], [address], [place], [place] [address]    Special Requests   Final  NONE Performed at [facility], [address] [place], [place] [address]    Culture   Final    NO GROWTH Performed at [facility], [address] [place], [place] [address]    Report Status 07/15/2020 FINAL  Final  SARS Coronavirus 2 by RT PCR (hospital order, performed in [facility] hospital lab) Nasopharyngeal Nasopharyngeal Swab     Status: None   Collection Time: 07/14/20  1:41 PM   Specimen: Nasopharyngeal Swab  Result Value Ref Range Status   SARS Coronavirus 2 NEGATIVE NEGATIVE Final    Comment: (NOTE) SARS-CoV-2 target nucleic acids are NOT DETECTED.  The SARS-CoV-2 RNA is generally detectable in upper and lower respiratory specimens during the acute phase of infection. The lowest concentration of SARS-CoV-2 viral copies this assay can detect is 250 copies / mL. A negative result does not preclude SARS-CoV-2 infection and should not be used as the sole basis for treatment or other patient management decisions.  A  negative result may occur with improper specimen collection / handling, submission of specimen other than nasopharyngeal swab, presence of viral mutation(s) within the areas targeted by this assay, and inadequate number of viral copies (<250 copies / mL). A negative result must be combined with clinical observations, patient history, and epidemiological information.  Fact Sheet for Patients:   BoilerBrush.com.cyhttps://www.fda.gov/media/136312/download  Fact Sheet for Healthcare Providers: https://pope.com/https://www.fda.gov/media/136313/download  This test is not yet approved or  cleared by the [place] FDA and has been authorized for detection and/or diagnosis of SARS-CoV-2 by FDA under an Emergency Use Authorization (EUA).  This EUA will remain in effect (meaning this test can be used) for the duration of the COVID-19 declaration under Section 564(b)(1) of the Act, 21 U.S.C. section 360bbb-3(b)(1), unless the authorization is terminated or revoked sooner.  Performed at [facility], [address] [place], [place] [address]   CSF culture     Status: None   Collection Time: 07/17/20  4:00 PM   Specimen: PATH Cytology CSF; Cerebrospinal Fluid  Result Value Ref Range Status   Specimen Description   Final    CSF Performed at [facility], [address] [place], [place] [address]    Special Requests   Final    CSF Performed at [facility], [address] [place], [place] [address]    Gram Stain   Final    NO ORGANISMS SEEN FEW RED BLOOD CELLS RARE WBC SEEN Performed at [facility], [address] [place], [place] [address]    Culture   Final    NO GROWTH 3 DAYS Performed at [facility], [address] [place], [place] [address]    Report Status 07/21/2020 FINAL  Final  Culture, fungus without smear     Status: None (Preliminary result)   Collection Time: 07/17/20  4:00 PM   Specimen: PATH Cytology CSF; Cerebrospinal Fluid  Result Value Ref  Range Status   Specimen Description   Final    CSF Performed at [facility], [address] [place], [place] [address]    Special Requests   Final    CSF Performed at [facility], [address] [place], [place] [address]    Culture   Final    No Fungi Isolated in 4 Weeks Performed at [facility], [address] [place], [place] [address]    Report Status PENDING  Incomplete    Procedures and diagnostic studies:  No  results found.             LOS: 8 days   Elham Fini  [facility]   Pager on [email or web address]. If 7PM-7AM, please contact night-coverage at [email or web address]     07/22/2020, 12:39 PM

## 2020-07-22 NOTE — Progress Notes (Signed)
Occupational Therapy Treatment Patient Details Name: Joseph Owen MRN: [id number] DOB: 1951/03/26 Today's Date: 07/22/2020    History of present illness Pt. is a 69 y.o.  male with onset of sybcopal episode at home was found by EMS, was hypothermic, hypoxic, in AKI and in hypercapnic resp failure.  Intubated, extubated, found to have encephalopathy, cleared for seizures, had tachycardia and had benzodiazepine in his system.  At home was falling frequently, has been depressed and had summer incident of [facility] admit for suicide attempt.  PMHx:  foot drop, chronic pain, GERD, CKD 3   OT comments   Pt. was assisted with repositioning. Pt. maintains his neck in lateral flexion to the left. Pt. required total A performing hand to mouth patterns for self-feeding a magic cup.  Pt. was able to initiate wiping his mouth with max hand over hand assist. Pt. Could benefit from OT services for ADL training, A/E training, and pt. caregiver education. Pt. discharge disposition continues to be most appropriate for SNF level of care upon discharge.        Follow Up Recommendations  SNF    Equipment Recommendations       Recommendations for Other Services      Precautions / Restrictions Precautions Precautions: Fall Restrictions Weight Bearing Restrictions: No       Mobility Bed Mobility  Total A    Transfers      Deferred. Pt. unable                Balance                                           ADL either performed or assessed with clinical judgement   ADL       Grooming: Wash/dry hands;Oral care;Total assistance;Bed level Grooming Details (indicate cue type and reason): hand over hand assistance     Lower Body Bathing: Independent                               Vision Baseline Vision/History: Wears glasses Wears Glasses: At all times Patient Visual Report: No change from baseline     Perception     Praxis      Cognition  Arousal/Alertness: Lethargic Behavior During Therapy: Flat affect Overall Cognitive Status: Difficult to assess                                 General Comments: pt with some mumbling speech, but able to state his name. disoriented to place, time, situation. extended time needed for processing/answering        Exercises     Shoulder Instructions       General Comments      Pertinent Vitals/ Pain       Pain Assessment: No/denies pain Pain Descriptors / Indicators: Guarding;Moaning;Grimacing Pain Intervention(s): Limited activity within patient's tolerance;Monitored during session  Home Living Family/patient expects to be discharged to:: Private residence Living Arrangements: Alone Available Help at Discharge: Other (Comment) Type of Home: House Home Access: Ramped entrance     Home Layout: One level     Bathroom Shower/Tub: Chief Strategy Officer: Standard     Home Equipment: Environmental consultant - 2 wheels;Cane - single point   Additional Comments: does not do  full cooking per family in history on chart      Prior Functioning/Environment          Comments: Per chart from brother's history pt is living in a cluttered environment and does not use RW but instead using walking stick   Frequency  Min 1X/week        Progress Toward Goals  OT Goals(current goals can now be found in the care plan section)     Acute Rehab OT Goals Patient Stated Goal: none stated OT Goal Formulation: Patient unable to participate in goal setting Potential to Achieve Goals: Fair  Plan      Co-evaluation                 AM-PAC OT "6 Clicks" Daily Activity     Outcome Measure   Help from another person eating meals?: Total Help from another person taking care of personal grooming?: Total Help from another person toileting, which includes using toliet, bedpan, or urinal?: Total Help from another person bathing (including washing, rinsing, drying)?:  Total Help from another person to put on and taking off regular upper body clothing?: Total Help from another person to put on and taking off regular lower body clothing?: Total 6 Click Score: 6    End of Session    OT Visit Diagnosis: Muscle weakness (generalized) (M62.81);Other symptoms and signs involving cognitive function   Activity Tolerance Patient limited by lethargy   Patient Left in bed;with call bell/phone within reach;with bed alarm set   Nurse Communication Mobility status        Time: 1696-7893 OT Time Calculation (min): 25 min  Charges: OT General Charges $OT Visit: 1 Visit OT Treatments $Self Care/Home Management : 23-37 mins  Olegario Messier, MS, OTR/L    Olegario Messier 07/22/2020, 2:25 PM

## 2020-07-22 NOTE — Progress Notes (Signed)
Was able to get patient to eat <25% of meal.  Was able to get patient to take a few bites of ice cream, chicken, and apple sauce

## 2020-07-22 NOTE — Progress Notes (Signed)
Physical Therapy Treatment Patient Details Name: Joseph Owen MRN: [id number] DOB: 25-Jul-1951 Today's Date: 07/22/2020    History of Present Illness 69 yo male with onset of sybcopal episode at home was found by EMS, was hypothermic, hypoxic, in AKI and in hypercapnic resp failure.  Intubated, extubated, found to have encephalopathy, cleared for seizures, had tachycardia and had benzodiazepine in his system.  At home was falling frequently, has been depressed and had summer incident of [facility] admit for suicide attempt.  PMHx:  foot drop, chronic pain, GERD, CKD 3    PT Comments    Pt with eyes closed throughout majority of session. Extended time needed for pt processing/answering. Oriented to name only, and reported things like "sometimes I feel like I am on a plane" and did endorse that he was thirsty. Supine AAROM of ankle pumps, heel slides, abduction/adduction and SAQ performed. Pt with very minimal muscle activation (L>R), constant multimodal cueing to promote participation. PT also assisted with bed mobility, totalAx2. Extended time spent on bed mobility today to allow RN to asses skin/place dressings. PT also provided soft tissue massage/trigger pointing to L UT due to extensive tension to improve pt's head positioning at rest. Pt with RN at bedside who PT informed the patient request for a drink. The patient would benefit from further skilled PT intervention to progress towards goals as able. Recommendation remains appropriate.       Follow Up Recommendations  SNF     Equipment Recommendations  Other (comment) (TBD)    Recommendations for Other Services       Precautions / Restrictions Precautions Precautions: Fall Restrictions Weight Bearing Restrictions: No    Mobility  Bed Mobility Overal bed mobility: Needs Assistance Bed Mobility: Rolling Rolling: Total assist            Transfers                 General transfer comment: defer secondary to  lethargy  Ambulation/Gait                 Stairs             Wheelchair Mobility    Modified Rankin (Stroke Patients Only)       Balance                                            Cognition Arousal/Alertness: Lethargic Behavior During Therapy: Flat affect Overall Cognitive Status: Difficult to assess                                 General Comments: pt with some mumbling speech, but able to state his name. disoriented to place, time, situation. extended time needed for processing/answering      Exercises Other Exercises Other Exercises: supine AAROM of ankle pumps, heel slides, abduction/adduction and SAQ. Pt with very minimal muscle activation (L>R), constant multimodal cueing to promote participation. Other Exercises: extended time spent on bed mobility today to allow RN to asses skin/place dressings. PT also provided soft tissue massage/trigger pointing to L UT due to extensive tension to improve pt's head positioning at rest.    General Comments        Pertinent Vitals/Pain Pain Assessment: Faces Faces Pain Scale: Hurts even more Pain Location: with bed mobility Pain Descriptors / Indicators: Guarding;Moaning;Grimacing  Pain Intervention(s): Limited activity within patient's tolerance;Monitored during session;Repositioned    Home Living                      Prior Function            PT Goals (current goals can now be found in the care plan section)      Frequency    Min 2X/week      PT Plan Current plan remains appropriate    Co-evaluation              AM-PAC PT "6 Clicks" Mobility   Outcome Measure  Help needed turning from your back to your side while in a flat bed without using bedrails?: Total Help needed moving from lying on your back to sitting on the side of a flat bed without using bedrails?: Total Help needed moving to and from a bed to a chair (including a wheelchair)?:  Total Help needed standing up from a chair using your arms (e.g., wheelchair or bedside chair)?: Total Help needed to walk in hospital room?: Total Help needed climbing 3-5 steps with a railing? : Total 6 Click Score: 6    End of Session   Activity Tolerance: Patient limited by lethargy;Patient limited by fatigue Patient left: in bed;with call bell/phone within reach;with nursing/sitter in room;with SCD's reapplied Nurse Communication: Mobility status PT Visit Diagnosis: Muscle weakness (generalized) (M62.81);Difficulty in walking, not elsewhere classified (R26.2);Adult, failure to thrive (R62.7)     Time: 0930-1004 PT Time Calculation (min) (ACUTE ONLY): 34 min  Charges:  $Therapeutic Exercise: 23-37 mins                     Olga Coaster PT, DPT 10:20 AM,07/22/20

## 2020-07-22 NOTE — Progress Notes (Signed)
  Speech Language Pathology Treatment: Dysphagia  Patient Details Name: Joseph Owen MRN: [id number] DOB: 1951/10/05 Today's Date: 07/22/2020 Time: 6010-9323 SLP Time Calculation (min) (ACUTE ONLY): 15 min  Assessment / Plan / Recommendation Clinical Impression  Unfortunately pt continues with severe encephalopathy that is only minimally improved. He continues to consume puree and nectar thick liquids without overt s/s of aspiration. However pt continues to be a very high risk of aspiration d/t continued severe encephalopathy and despite oral care before and after PO intake, he was noted to have oral residue later in day and is not a candidate for an upgraded diet. Not only does his encephalopathic state place him at high risk of aspirating, it also prevents him from consuming enough POs to maintain nutrition/hydration. At this point, I am recommending long-term nutrition should this align with his goals of care. Request Palliative Care consult to help with determining pt's GOC.   ST intervention is no longer indicated. ST to sign off.     HPI HPI:  Joseph Owen is a 69 y.o. male with a past history of chronic pain and GERD who is brought to the ED by EMS after being found unresponsive.  Family last talked to him on 07/13/2020,  but on 07/14/2020 they could not get him to answer the phone when they called so they asked EMS to do a wellness check.  EMS found the patient on the bathroom floor with a tub overflowing with water.  Initial temp was 86 degrees.  They placed the patient 15 liters nonrebreather and obtained an oxygen saturation of 92%.Pt intubated in the ED, transferred to CCU and extubated on 07/15/2020 at 1150. Chest x-ray and CT head are all negative. Etiology is unknown at this time.       SLP Plan  Discharge SLP treatment due to (comment) (continued AMS)       Recommendations  Diet recommendations: Dysphagia 1 (puree);Nectar-thick liquid Liquids provided via:  Teaspoon;Cup Medication Administration: Crushed with puree Supervision: Full supervision/cueing for compensatory strategies;Trained caregiver to feed patient;Staff to assist with self feeding Compensations: Minimize environmental distractions;Slow rate;Small sips/bites Postural Changes and/or Swallow Maneuvers: Seated upright 90 degrees                Oral Care Recommendations: Oral care before and after PO Follow up Recommendations:  (Palliative Care consult to determine GOC) SLP Visit Diagnosis: Dysphagia, unspecified (R13.10) Plan: Discharge SLP treatment due to (comment) (continued AMS)       GO               Joseph Owen B. Joseph Owen M.S., CCC-SLP, [facility] Speech-Language Pathologist Rehabilitation Services Office [phone]  Joseph Owen Joseph Owen 07/22/2020, 1:15 PM

## 2020-07-23 DIAGNOSIS — R400 Somnolence: Secondary | ICD-10-CM | POA: Diagnosis not present

## 2020-07-23 LAB — CBC WITH DIFFERENTIAL/PLATELET
Abs Immature Granulocytes: 0.07 10*3/uL (ref 0.00–0.07)
Basophils Absolute: 0 10*3/uL (ref 0.0–0.1)
Basophils Relative: 0 %
Eosinophils Absolute: 0.2 10*3/uL (ref 0.0–0.5)
Eosinophils Relative: 2 %
HCT: 30.5 % — ABNORMAL LOW (ref 39.0–52.0)
Hemoglobin: 10.2 g/dL — ABNORMAL LOW (ref 13.0–17.0)
Immature Granulocytes: 1 %
Lymphocytes Relative: 10 %
Lymphs Abs: 1.1 10*3/uL (ref 0.7–4.0)
MCH: 33.2 pg (ref 26.0–34.0)
MCHC: 33.4 g/dL (ref 30.0–36.0)
MCV: 99.3 fL (ref 80.0–100.0)
Monocytes Absolute: 1.3 10*3/uL — ABNORMAL HIGH (ref 0.1–1.0)
Monocytes Relative: 12 %
Neutro Abs: 8.1 10*3/uL — ABNORMAL HIGH (ref 1.7–7.7)
Neutrophils Relative %: 75 %
Platelets: 158 10*3/uL (ref 150–400)
RBC: 3.07 MIL/uL — ABNORMAL LOW (ref 4.22–5.81)
RDW: 13.2 % (ref 11.5–15.5)
WBC: 10.7 10*3/uL — ABNORMAL HIGH (ref 4.0–10.5)
nRBC: 0 % (ref 0.0–0.2)

## 2020-07-23 LAB — GLUCOSE, CAPILLARY
Glucose-Capillary: 106 mg/dL — ABNORMAL HIGH (ref 70–99)
Glucose-Capillary: 95 mg/dL (ref 70–99)
Glucose-Capillary: 106 mg/dL — ABNORMAL HIGH (ref 70–99)
Glucose-Capillary: 142 mg/dL — ABNORMAL HIGH (ref 70–99)
Glucose-Capillary: 130 mg/dL — ABNORMAL HIGH (ref 70–99)

## 2020-07-23 LAB — BASIC METABOLIC PANEL
Anion gap: 10 (ref 5–15)
BUN: 20 mg/dL (ref 8–23)
CO2: 30 mmol/L (ref 22–32)
Calcium: 8.4 mg/dL — ABNORMAL LOW (ref 8.9–10.3)
Chloride: 109 mmol/L (ref 98–111)
Creatinine, Ser: 1.01 mg/dL (ref 0.61–1.24)
GFR calc Af Amer: >60 mL/min (ref >60)
GFR calc non Af Amer: >60 mL/min (ref >60)
Glucose, Bld: 105 mg/dL — ABNORMAL HIGH (ref 70–99)
Potassium: 3.4 mmol/L — ABNORMAL LOW (ref 3.5–5.1)
Sodium: 149 mmol/L — ABNORMAL HIGH (ref 135–145)

## 2020-07-23 LAB — PHOSPHORUS: Phosphorus: 3 mg/dL (ref 2.5–4.6)

## 2020-07-23 LAB — MAGNESIUM: Magnesium: 1.8 mg/dL (ref 1.7–2.4)

## 2020-07-23 MED ORDER — POTASSIUM CHLORIDE 20 MEQ PO PACK
40.0000 meq | PACK | Freq: Two times a day (BID) | ORAL | Status: AC
  Administered 2020-07-23 (×2): 40 meq via ORAL
  Filled 2020-07-23 (×2): qty 2

## 2020-07-23 MED ORDER — POTASSIUM CHLORIDE CRYS ER 20 MEQ PO TBCR: 20.0000 meq | EXTENDED_RELEASE_TABLET | Freq: Once | ORAL | Status: DC

## 2020-07-23 MED ORDER — DEXTROSE 5 % IV SOLN: INTRAVENOUS | Status: DC

## 2020-07-23 MED ORDER — MAGNESIUM SULFATE 2 GM/50ML IV SOLN
2.0000 g | Freq: Once | INTRAVENOUS | Status: AC
  Administered 2020-07-23: 2 g via INTRAVENOUS
  Filled 2020-07-23: qty 50

## 2020-07-23 NOTE — Progress Notes (Addendum)
PHARMACY CONSULT NOTE - FOLLOW UP  Pharmacy Consult for Electrolyte Monitoring and Replacement   Recent Labs: Potassium (mmol/L)  Date Value  07/23/2020 3.4 (L)   Magnesium (mg/dL)  Date Value  68/08/5725 1.8   Calcium (mg/dL)  Date Value  20/35/5974 8.4 (L)   Albumin (g/dL)  Date Value  16/38/4536 3.8   Phosphorus (mg/dL)  Date Value  46/80/3212 3.0   Sodium (mmol/L)  Date Value  07/23/2020 149 (H)   Corr Ca: 8.9  Assessment: 69 year old male admitted for respiratory failure requiring intubation. PMH significant for chronic pain, depression, frequent falls, GERD, and hx of alcohol abuse. Patient was intubated 9/21 and extubated 9/22, remains somnolent and NPO as a result and placed on NG tube. Patient with electrolyte abnormalities, pharmacy consulted to replace.   9/27 NG tubed removed and patient participated in swallow evaluation. Patient diet advance from NPO to dysphagia diet (puree) and is able to tolerate PO meds.   Na continues to increase 147>149 after free water was discontinued. Likely from low PO intake.   Phos 3.3>3.2>3.0  Goal of Therapy:  Electrolytes WNL  Plan:  K 3.4 - will give Kcl x1; MD d/c'd order for x1 and ordered 7mEqx2 Phos trending down, but wnl - will continue to monitor and replace as needed Na continues to trend up - will recommend adding free water back.  Continue to monitor electrolytes with AM labs.   Joseph Owen, PharmD Pharmacy Resident  07/23/2020 7:05 AM

## 2020-07-23 NOTE — Progress Notes (Addendum)
Progress Note    Joseph Owen  QZE:[id number] DOB: [birth year]  DOA: 07/14/2020 PCP: Jaclyn Shaggy, MD      Brief Narrative:    Medical records reviewed and are as summarized below:  Joseph Owen is a 69 y.o. male  past history of chronic pain, depression chronic history of frequent fall and unsteadiness which has been worked up by neurologist in the past, alcohol abuse and GERD(found to have some esophageal stenosis which was dilated in July 2021) presented to ED after being found unresponsive on the bathroom floor with the tub overflowing with water.      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    PLAN  No acute abnormality on chest x-ray. Replete potassium and monitor levels. Replete magnesium with IV mag sulfate to keep magnesium at 2 or more Start IV fluids (5% dextrose infusion) for worsening hypernatremia. Continue antihypertensives Continue antidepressants Continue IV thiamine Patient requested to go to a rehab facility to disclose discharge because he has a daughter and a friend in [place].  This was discussed with Clarisse Gouge, social worker   Diet Order            DIET - DYS 1 Room service appropriate? Yes; Fluid consistency: Nectar Thick  Diet effective now                    Consultants:  Intensivist  Neurologist  Psychiatrist  Procedures:      Medications:   .  Chlorhexidine Gluconate Cloth  6 each Topical Daily  . enoxaparin (LOVENOX) injection  40 mg Subcutaneous Q24H  . escitalopram  5 mg Oral Daily  . famotidine  20 mg Oral BID  . folic acid  1 mg Oral Daily  . gabapentin  100 mg Oral Q8H  . losartan  25 mg Oral Daily  . mouth rinse  15 mL Mouth Rinse BID  . mirtazapine  15 mg Oral QHS  . multivitamin with minerals  1 tablet Oral Daily  . nicotine  14 mg Transdermal Daily  . pantoprazole sodium  20 mg Oral Daily  . potassium chloride  40 mEq Oral BID  . sodium chloride flush  10-40 mL Intracatheter Q12H  . sodium chloride flush  3 mL Intravenous Q12H  . [START ON 07/27/2020] thiamine  100 mg Oral Daily   Continuous Infusions: . sodium chloride 250 mL (07/21/20 2050)  . dextrose 50 mL/hr at 07/23/20 1002  . magnesium sulfate bolus IVPB    . thiamine injection 250 mg (07/23/20 1046)     Anti-infectives (From admission, onward)   Start     Dose/Rate Route Frequency Ordered Stop   07/14/20 1445  vancomycin (VANCOREADY) IVPB 750 mg/150 mL        750 mg 150 mL/hr over 60 Minutes Intravenous  Once 07/14/20 1335 07/14/20 1553   07/14/20 1330  ceFEPIme (MAXIPIME) 2 g in sodium chloride 0.9 % 100 mL IVPB        2 g  200 mL/hr over 30 Minutes Intravenous  Once 07/14/20 1328 07/14/20 1444   07/14/20 1330  metroNIDAZOLE (FLAGYL) IVPB 500 mg        500 mg 100 mL/hr over 60 Minutes Intravenous  Once 07/14/20 1328 07/14/20 1600   07/14/20 1330  vancomycin (VANCOCIN) IVPB 1000 mg/200 mL premix  Status:  Discontinued        1,000 mg 200 mL/hr over 60 Minutes Intravenous  Once 07/14/20 1328 07/17/20 0952             Family Communication/Anticipated D/C date and plan/Code Status   DVT prophylaxis: enoxaparin (LOVENOX) injection 40 mg Start: 07/18/20 2200 Place and maintain sequential compression device Start: 07/17/20 1350 SCDs Start: 07/14/20 1508     Code Status: Full Code  Family Communication:  Disposition Plan:    Status is:  Inpatient  Remains inpatient appropriate because:Unsafe d/c plan   Dispo:  Patient From: Home  Planned Disposition: Skilled Nursing Facility  Expected discharge date: 07/25/20  Medically stable for discharge: No            Subjective:   Interval events noted.  No fever overnight. Patient is more awake and communicative today although speech is slow.  He feels weak.  Objective:    Vitals:   07/22/20 2001 07/23/20 0350 07/23/20 0718 07/23/20 1100  BP: 131/69 123/78 (!) 149/84 131/62  Pulse: 85 70 77 80  Resp: 19 18 17 18   Temp: 100.1 F (37.8 C) 99 F (37.2 C) 98.4 F (36.9 C) 98.7 F (37.1 C)  TempSrc: Oral Oral Oral Oral  SpO2: 93% 93% 94% 97%  Weight:  81.7 kg    Height:       No data found.   Intake/Output Summary (Last 24 hours) at 07/23/2020 1425 Last data filed at 07/23/2020 0945 Gross per 24 hour  Intake 360 ml  Output 450 ml  Net -90 ml   Filed Weights   07/20/20 0535 07/22/20 0351 07/23/20 0350  Weight: 84.7 kg 79.8 kg 81.7 kg    Exam:  GEN: NAD SKIN: No rash EYES: EOMI ENT: MMM CV: RRR PULM: CTA B ABD: soft, obese, NT, +BS CNS: AAO x 3, non focal EXT: Edema bilateral upper extremities.  Trace edema bilateral legs.  No tenderness or erythema.    Data Reviewed:   I have personally reviewed following labs and imaging studies:  Labs: Labs show the following:   Basic Metabolic Panel: Recent Labs  Lab 07/18/20 0525 07/18/20 0525 07/19/20 0518 07/19/20 0518 07/20/20 0531 07/20/20 0531 07/21/20 0351 07/21/20 0351 07/22/20 0459 07/23/20 0515  NA 143   < > 146*  --  147*  --  145  --  147* 149*  K 3.7   < > 3.6   < > 3.5   < > 3.4*   < > 3.7 3.4*  CL 108   < > 111  --  109  --  109  --  108 109  CO2 26   < > 28  --  29  --  28  --  29 30  GLUCOSE 117*   < > 104*  --  119*  --  98  --  105* 105*  BUN 14   < > 14  --  15  --  16  --  18 20  CREATININE 0.92   < > 0.86  --  0.98  --  1.00  --  1.01 1.01  CALCIUM 8.1*   < >  8.7*  --  8.8*  --  8.7*  --  8.7* 8.4*  MG 1.9  --  2.0  --   --   --  1.6*  --  1.8 1.8  PHOS 2.5  --   --   --  2.4*  --  3.3  --  3.2 3.0   < > = values in this interval not displayed.   GFR Estimated Creatinine Clearance: 74.7 mL/min (by C-G formula based on SCr of 1.01 mg/dL). Liver Function Tests: No results for input(s): AST, ALT, ALKPHOS, BILITOT, PROT, ALBUMIN in the last 168 hours. No results for input(s): LIPASE, AMYLASE in the last 168 hours. Recent Labs  Lab 07/17/20 1702  AMMONIA 11   Coagulation profile No results for input(s): INR, PROTIME in the last 168 hours.  CBC: Recent Labs  Lab 07/18/20 0525 07/20/20 0531 07/21/20 0351 07/23/20 0515  WBC 10.5 11.0* 11.4* 10.7*  NEUTROABS  --   --   --  8.1*  HGB 10.5* 10.7* 10.2* 10.2*  HCT 30.0* 32.6* 31.0* 30.5*  MCV 98.7 103.2* 100.6* 99.3  PLT 167 157 165 158   Cardiac Enzymes: Recent Labs  Lab 07/16/20 1609  CKTOTAL 693*   BNP (last 3 results) No results for input(s): PROBNP in the last 8760 hours. CBG: Recent Labs  Lab 07/22/20 2001 07/22/20 2347 07/23/20 0351 07/23/20 0719 07/23/20 1102  GLUCAP 100* 100* 106* 95 106*   D-Dimer: No results for input(s): DDIMER in the last 72 hours. Hgb A1c: No results for input(s): HGBA1C in the last 72 hours. Lipid Profile: No results for input(s): CHOL, HDL, LDLCALC, TRIG, CHOLHDL, LDLDIRECT in the last 72 hours. Thyroid function studies: No results for input(s): TSH, T4TOTAL, T3FREE, THYROIDAB in the last 72 hours.  Invalid input(s): FREET3 Anemia work up: No results for input(s): VITAMINB12, FOLATE, FERRITIN, TIBC, IRON, RETICCTPCT in the last 72 hours. Sepsis Labs: Recent Labs  Lab 07/16/20 1609 07/16/20 1847 07/18/20 0525 07/20/20 0531 07/21/20 0351 07/23/20 0515  PROCALCITON  --   --   --   --  <0.10  --   WBC  --   --  10.5 11.0* 11.4* 10.7*  LATICACIDVEN 1.4 1.1  --   --   --   --     Microbiology Recent Results (from the past 240  hour(s))  Blood Culture (routine x 2)     Status: None   Collection Time: 07/14/20  1:41 PM   Specimen: BLOOD  Result Value Ref Range Status   Specimen Description BLOOD BLOOD RIGHT FOREARM  Final   Special Requests   Final    BOTTLES DRAWN AEROBIC AND ANAEROBIC Blood Culture adequate volume   Culture   Final    NO GROWTH 5 DAYS Performed at [facility], [address] [place], [place] [address]    Report Status 07/19/2020 FINAL  Final  Blood Culture (routine x 2)     Status: None   Collection Time: 07/14/20  1:41 PM   Specimen: BLOOD  Result Value Ref Range Status   Specimen Description BLOOD BLOOD RIGHT HAND  Final   Special Requests   Final    BOTTLES DRAWN AEROBIC AND ANAEROBIC Blood Culture adequate volume   Culture   Final    NO GROWTH 5 DAYS Performed at [facility], [address] [place], [place] [address]    Report Status 07/19/2020 FINAL  Final  Urine culture     Status: None   Collection  Time: 07/14/20  1:41 PM   Specimen: In/Out Cath Urine  Result Value Ref Range Status   Specimen Description   Final    IN/OUT CATH URINE Performed at [facility], [address], [place], [place] [address]    Special Requests   Final    NONE Performed at [facility], [address], [place], [place] [address]    Culture   Final    NO GROWTH Performed at [facility], [address] [place], [place] [address]    Report Status 07/15/2020 FINAL  Final  SARS Coronavirus 2 by RT PCR (hospital order, performed in [facility] hospital lab) Nasopharyngeal Nasopharyngeal Swab     Status: None   Collection Time: 07/14/20  1:41 PM   Specimen: Nasopharyngeal Swab  Result Value Ref Range Status   SARS Coronavirus 2 NEGATIVE NEGATIVE Final    Comment: (NOTE) SARS-CoV-2 target nucleic acids are NOT DETECTED.  The SARS-CoV-2 RNA is generally detectable in upper and lower respiratory specimens during the acute phase of infection. The  lowest concentration of SARS-CoV-2 viral copies this assay can detect is 250 copies / mL. A negative result does not preclude SARS-CoV-2 infection and should not be used as the sole basis for treatment or other patient management decisions.  A negative result may occur with improper specimen collection / handling, submission of specimen other than nasopharyngeal swab, presence of viral mutation(s) within the areas targeted by this assay, and inadequate number of viral copies (<250 copies / mL). A negative result must be combined with clinical observations, patient history, and epidemiological information.  Fact Sheet for Patients:   BoilerBrush.com.cy  Fact Sheet for Healthcare Providers: https://pope.com/  This test is not yet approved or  cleared by the [place] FDA and has been authorized for detection and/or diagnosis of SARS-CoV-2 by FDA under an Emergency Use Authorization (EUA).  This EUA will remain in effect (meaning this test can be used) for the duration of the COVID-19 declaration under Section 564(b)(1) of the Act, 21 U.S.C. section 360bbb-3(b)(1), unless the authorization is terminated or revoked sooner.  Performed at [facility], [address] [place], [place] [address]   CSF culture     Status: None   Collection Time: 07/17/20  4:00 PM   Specimen: PATH Cytology CSF; Cerebrospinal Fluid  Result Value Ref Range Status   Specimen Description   Final    CSF Performed at [facility], [address] [place], [place] [address]    Special Requests   Final    CSF Performed at [facility], [address] [place], [place] [address]    Gram Stain   Final    NO ORGANISMS SEEN FEW RED BLOOD CELLS RARE WBC SEEN Performed at [facility], [address] [place], [place] [address]    Culture   Final    NO GROWTH 3 DAYS Performed at [facility], [address] [place], [place] [address]    Report Status 07/21/2020 FINAL  Final  Culture, fungus without smear     Status: None (Preliminary result)   Collection Time: 07/17/20  4:00 PM   Specimen: PATH Cytology CSF; Cerebrospinal Fluid  Result Value Ref Range Status   Specimen Description   Final    CSF Performed at [facility], [address] [place], [place] [address]    Special Requests   Final    CSF Performed at [facility],  [address], [place], [place] [address]    Culture   Final    NO FUNGUS ISOLATED AFTER 6 DAYS Performed at [facility], [address], [place], [place] [address]    Report Status PENDING  Incomplete    Procedures and diagnostic studies:  DG Chest Port 1 View  Result Date: 07/22/2020 CLINICAL DATA:  Respiratory failure. EXAM: PORTABLE CHEST 1 VIEW COMPARISON:  July 17, 2020. FINDINGS: Stable cardiomediastinal silhouette. Old bilateral rib fractures are noted. No pneumothorax or pleural effusion is noted. Lungs are clear. IMPRESSION: No active disease. Electronically Signed   By: Lupita Raider M.D.   On: 07/22/2020 13:53               LOS: 9 days   Piercen Covino  [facility]   Pager on [email or web address]. If 7PM-7AM, please contact night-coverage at [email or web address]     07/23/2020, 2:25 PM

## 2020-07-23 NOTE — Care Management Important Message (Signed)
Important Message  Patient Details  Name: Joseph Owen MRN: [id number] Date of Birth: 08-15-1951   Medicare Important Message Given:  Yes     [name] 07/23/2020, 1:29 PM

## 2020-07-24 DIAGNOSIS — R400 Somnolence: Secondary | ICD-10-CM | POA: Diagnosis not present

## 2020-07-24 LAB — BASIC METABOLIC PANEL
Anion gap: 6 (ref 5–15)
BUN: 21 mg/dL (ref 8–23)
CO2: 32 mmol/L (ref 22–32)
Calcium: 8.7 mg/dL — ABNORMAL LOW (ref 8.9–10.3)
Chloride: 110 mmol/L (ref 98–111)
Creatinine, Ser: 0.93 mg/dL (ref 0.61–1.24)
GFR calc Af Amer: >60 mL/min (ref >60)
GFR calc non Af Amer: >60 mL/min (ref >60)
Glucose, Bld: 119 mg/dL — ABNORMAL HIGH (ref 70–99)
Potassium: 3.4 mmol/L — ABNORMAL LOW (ref 3.5–5.1)
Sodium: 148 mmol/L — ABNORMAL HIGH (ref 135–145)

## 2020-07-24 LAB — GLUCOSE, CAPILLARY
Glucose-Capillary: 111 mg/dL — ABNORMAL HIGH (ref 70–99)
Glucose-Capillary: 111 mg/dL — ABNORMAL HIGH (ref 70–99)
Glucose-Capillary: 117 mg/dL — ABNORMAL HIGH (ref 70–99)
Glucose-Capillary: 121 mg/dL — ABNORMAL HIGH (ref 70–99)
Glucose-Capillary: 127 mg/dL — ABNORMAL HIGH (ref 70–99)
Glucose-Capillary: 128 mg/dL — ABNORMAL HIGH (ref 70–99)
Glucose-Capillary: 130 mg/dL — ABNORMAL HIGH (ref 70–99)

## 2020-07-24 LAB — PHOSPHORUS: Phosphorus: 3.2 mg/dL (ref 2.5–4.6)

## 2020-07-24 LAB — MAGNESIUM: Magnesium: 2.1 mg/dL (ref 1.7–2.4)

## 2020-07-24 LAB — VDRL, CSF: VDRL Quant, CSF: NONREACTIVE

## 2020-07-24 MED ORDER — POTASSIUM CHLORIDE 20 MEQ PO PACK
40.0000 meq | PACK | Freq: Three times a day (TID) | ORAL | Status: AC
  Administered 2020-07-24 (×3): 40 meq via ORAL
  Filled 2020-07-24 (×3): qty 2

## 2020-07-24 MED ORDER — POTASSIUM CHLORIDE 20 MEQ PO PACK: 40.0000 meq | PACK | Freq: Two times a day (BID) | ORAL | Status: DC

## 2020-07-24 MED ORDER — THIAMINE HCL 100 MG PO TABS
100.0000 mg | ORAL_TABLET | Freq: Every day | ORAL | Status: DC
  Administered 2020-07-25 – 2020-07-30 (×6): 100 mg via ORAL
  Filled 2020-07-24 (×6): qty 1

## 2020-07-24 MED ORDER — ENSURE ENLIVE PO LIQD
237.0000 mL | Freq: Three times a day (TID) | ORAL | Status: DC
  Administered 2020-07-24 – 2020-07-27 (×10): 237 mL via ORAL

## 2020-07-24 NOTE — Progress Notes (Addendum)
Occupational Therapy Treatment Patient Details Name: Joseph Owen MRN: [id number] DOB: [birth year] Today's Date: 07/24/2020    History of present illness Pt is a 69 y/o M with onset of syncopal episode at home was found by EMS, was hypothermic, hypoxic, in AKI and in hypercapnic resp failure.  Intubated, extubated, found to have encephalopathy, cleared for seizures, had tachycardia and had benzodiazepine in his system.  At home was falling frequently, has been depressed and had summer incident of [facility] admit for suicide attempt.  PMHx:  foot drop, chronic pain, GERD, CKD 3   OT comments  Pt seen for OT tx this date to f/u re: positioning, strengthening as it pertains to self-care ADL performance and ADL mobility. OT engages pt in bed level (with HOB elevated) UB grooming tasks including oral care and face washing with MOD/MAX A with pt requiring use of both hands to contribute 2/2 gross weakness of UEs. Pt with improved participation and tolerance this date. In addition, OT engages pt in light, graded, bed level UB exercise (see below) for which he tolerates well and demos improved command following. Does require 2-3 minute rest breaks between exercises and can only tolerate 5-10 reps at a time. Pt also contributes to bed level rolling (MAX A) to optimize positioning. OT places pillows under pt's L side to assist him into semi right side-lying. This position assists with natural/gravity-assisted stretch to his cervical region as the connective tissue is noted to be tense/tight 2/2 pt primarily sleeping/lying with neck in L lateral flexion over course of hospital stay. Pt left in bed with bed alarm. CNA presenting to take VS. Continue to anticipate pt will require extensive rehab upon d/c from acute setting and therefore, SNF remains most appropriate recommendation.   Follow Up Recommendations  SNF    Equipment Recommendations  Other (comment) (defer)    Recommendations for Other Services       Precautions / Restrictions Precautions Precautions: Fall Restrictions Weight Bearing Restrictions: No       Mobility Bed Mobility Overal bed mobility: Needs Assistance Bed Mobility: Rolling Rolling: Max assist         General bed mobility comments: MAX cues for hand placement to contribue to rolling for repositioning. Makes good effort to contribute.  Transfers                 General transfer comment: Deferred. Pt. is unable    Balance                                           ADL either performed or assessed with clinical judgement   ADL Overall ADL's : Needs assistance/impaired     Grooming: Wash/dry face;Oral care;Moderate assistance;Maximal assistance;Bed level Grooming Details (indicate cue type and reason): HOB elevated, pt requires use of both hands to stabilize items and reach up to face. Pt grossly weak, but improved with ability to participate and hold oral swab over evaluation.                                     Vision Baseline Vision/History: Wears glasses Wears Glasses: At all times Additional Comments: bruise over L eye and 2 staples   Perception     Praxis      Cognition Arousal/Alertness: Lethargic Behavior During Therapy: Flat affect  Overall Cognitive Status: Impaired/Different from baseline                                 General Comments: Pt more alert this date. Able to state name, that he's in the hospital, and year. Not oriented to month, day, date or situation. Increased communication/vocalization and somewhat clearer quality of speech as well. moderate eye opening throughout session with increased visual attention. Still limited and some confusion. Able to follow all one step commands.        Exercises Other Exercises Other Exercises: OT facilitates ed re: importance of attempting to mobilize his neck and UEs himself. Eleavting UEs to help with edema mgt. Other Exercises: OT  engages pt in 2 sets x5 reps straigh arm raises (pt only successfully can raise ~1/3 arc of motion), 1 set x5 reps FWD punches (per arm). Pt fatigues quickly, requires MOD multimodal cues for pace, 2-3 mins rest between.   Shoulder Instructions       General Comments      Pertinent Vitals/ Pain       Pain Assessment: No/denies pain Faces Pain Scale: Hurts little more Pain Location: with attempts to mobilize neck-rotate/flex to R side as pt with significant L lateral flexion in resting and connective tissue of neck becoming tense. Pain Intervention(s): Monitored during session;Repositioned;Other (comment) (massage to connective tissue, repositioned with pillows under his L side to slightly R side-lying to encourage natural/gravity-assisted stretch of cervical area.)  Home Living                                          Prior Functioning/Environment              Frequency  Min 1X/week        Progress Toward Goals  OT Goals(current goals can now be found in the care plan section)  Progress towards OT goals: Progressing toward goals  Acute Rehab OT Goals Patient Stated Goal: none stated OT Goal Formulation: Patient unable to participate in goal setting Time For Goal Achievement: 07/31/20 Potential to Achieve Goals: Fair  Plan Discharge plan remains appropriate    Co-evaluation                 AM-PAC OT "6 Clicks" Daily Activity     Outcome Measure   Help from another person eating meals?: A Lot Help from another person taking care of personal grooming?: A Lot Help from another person toileting, which includes using toliet, bedpan, or urinal?: Total Help from another person bathing (including washing, rinsing, drying)?: Total Help from another person to put on and taking off regular upper body clothing?: Total Help from another person to put on and taking off regular lower body clothing?: Total 6 Click Score: 8    End of Session    OT  Visit Diagnosis: Muscle weakness (generalized) (M62.81);Other symptoms and signs involving cognitive function   Activity Tolerance Patient tolerated treatment well;Patient limited by lethargy   Patient Left in bed;with call bell/phone within reach;with bed alarm set;Other (comment) (repositioned with pillows under his L side to slightly R side-lying to encourage natural/gravity-assisted stretch of cervical area.)   Nurse Communication Mobility status        Time: 1443-1540 OT Time Calculation (min): 38 min  Charges: OT General Charges $OT Visit: 1 Visit OT Treatments $  Self Care/Home Management : 8-22 mins $Therapeutic Activity: 8-22 mins $Therapeutic Exercise: 8-22 mins   Rejeana Brock, MS, OTR/L ascom [phone] 07/24/20, 2:13 PM

## 2020-07-24 NOTE — Progress Notes (Signed)
Nutrition Follow-up  DOCUMENTATION CODES:   Not applicable  INTERVENTION:  Recommend placement of small-bore feeding tube (Dobbhoff tube) and resumption of tube feeds. Recommend: -Initiate Osmolite 1.5 Cal at 20 mL/hr and advance by 20 mL/hr every 8 hours to goal rate of 60 mL/hr (1440 mL goal daily volume) -PROSource TF 45 mL BID per tube -Goal regimen provides 2240 kcal, 112 grams of protein, 1094 mL H2O daily  Can continue oral nutrition supplements: -Hormel Shake po TID with meals, each supplement provides 520 kcal and 22 grams of protein -Magic Cup TID with meals, each supplement provides 290 kcal and 9 grams of protein  Continue MVI po daily.  Monitor magnesium, potassium, and phosphorus daily for at least 3 days, MD to replete as needed, as pt is at risk for refeeding syndrome.  NUTRITION DIAGNOSIS:   Inadequate oral intake related to lethargy/confusion as evidenced by meal completion < 50%.  Ongoing.  GOAL:   Patient will meet greater than or equal to 90% of their needs  Not met.  MONITOR:   PO intake, Supplement acceptance, Diet advancement, Labs, Weight trends, I & O's  REASON FOR ASSESSMENT:   Consult Enteral/tube feeding initiation and management  ASSESSMENT:   69 year old male with PMHx of chronic pain, depression, frequent falls, GERD, esophageal stenosis s/p dilation 04/2020 admitted with decreased responsiveness/encephalopathy, possible Wernicke's/Korsakoff, AKI on CKD stage III.   9/21 intubated 9/22 extubated 9/27 diet advanced to dysphagia 1 with nectar-thick liquids  Met with patient at bedside but he was very lethargic. Patient opened eyes briefly for RD but quickly fell back asleep and was unable to answer any questions. Per review of chart patient ate 25% of his dinner last night. Discussed with RN. Patient for breakfast today had 100% applesauce, 25% milk, and 25% cranberry juice. For lunch he had 25% meat, 25% Magic Cup, 35% cranberry juice. In  the past 24 hours patient has had approximately 522 kcal (25% minimum estimated kcal needs) and 20 grams of protein (19% minimum estimated protein needs).  Patient has now had a total of 10 days of inadequate oral intake including the 6 days he was NPO prior to diet advancement.  Medications reviewed and include: famotidine, folic acid 1 mg daily, gabapentin, Remeron 15 mg QHS, MVI daily, nicotine patch, Protonix, potassium chloride 40 mEq TID, thiamine 100 mg daily, D5W at 75 mL/hr.  Labs reviewed: CBG 111-128, Sodium 148, Potassium 3.4. Phosphorus and Magnesium are WNL.  I/O: 925 mL UOP yesterday (0.5 mL/kg/hr)  Weight trend: 80.6 kg on 10/1; -1.3 kg from 9/22; current wt likely falsely elevated by edema, which is masking true weight loss  Messaged MD via secure chat regarding recommendations for initiation of tube feeds and MD is aware.  Diet Order:   Diet Order            DIET - DYS 1 Room service appropriate? Yes; Fluid consistency: Nectar Thick  Diet effective now                EDUCATION NEEDS:   No education needs have been identified at this time  Skin:  Skin Assessment: Reviewed RN Assessment  Last BM:  07/19/2020 per chart  Height:   Ht Readings from Last 1 Encounters:  07/15/20 6' 0.01" (1.829 m)   Weight:   Wt Readings from Last 1 Encounters:  07/24/20 80.6 kg   Ideal Body Weight:  80.9 kg  BMI:  Body mass index is 24.1 kg/m.  Estimated Nutritional  Needs:   Kcal:  2100-2300  Protein:  105-115 grams  Fluid:  2.1-2.3 L/day  Jacklynn Barnacle, MS, RD, LDN Pager number available on Amion

## 2020-07-24 NOTE — Plan of Care (Signed)

## 2020-07-24 NOTE — Progress Notes (Signed)
Progress Note    [name]  VOZ:[id number] DOB: 16-Nov-1950  DOA: 07/14/2020 PCP: Jaclyn Shaggy, MD      Brief Narrative:    Medical records reviewed and are as summarized below:  Joseph Owen is a 69 y.o. male  past history of chronic pain, depression chronic history of frequent fall and unsteadiness which has been worked up by neurologist in the past, alcohol abuse and GERD(found to have some esophageal stenosis which was dilated in July 2021) presented to ED after being found unresponsive on the bathroom floor with the tub overflowing with water.      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    PLAN  Increase IV dextrose infusion from 50 cc/h to 75 cc/h for hypernatremia. Continue potassium repletion. Monitor BMP. Continue antihypertensives Continue antidepressants Change IV to oral thiamine Awaiting placement to SNF   Diet Order            DIET - DYS 1 Room service appropriate? Yes; Fluid consistency: Nectar Thick  Diet effective now                    Consultants:  Intensivist  Neurologist  Psychiatrist  Procedures:      Medications:   . Chlorhexidine Gluconate Cloth  6 each Topical Daily  . enoxaparin (LOVENOX) injection  40 mg Subcutaneous Q24H  . escitalopram  5 mg Oral Daily  . famotidine  20 mg Oral BID  . folic acid  1 mg Oral Daily  . gabapentin  100 mg  Oral Q8H  . losartan  25 mg Oral Daily  . mouth rinse  15 mL Mouth Rinse BID  . mirtazapine  15 mg Oral QHS  . multivitamin with minerals  1 tablet Oral Daily  . nicotine  14 mg Transdermal Daily  . pantoprazole sodium  20 mg Oral Daily  . potassium chloride  40 mEq Oral TID  . sodium chloride flush  10-40 mL Intracatheter Q12H  . sodium chloride flush  3 mL Intravenous Q12H  . [START ON 07/27/2020] thiamine  100 mg Oral Daily   Continuous Infusions: . sodium chloride Stopped (07/22/20 1150)  . dextrose 75 mL/hr at 07/24/20 1041  . thiamine injection 250 mg (07/24/20 1106)     Anti-infectives (From admission, onward)   Start     Dose/Rate Route Frequency Ordered Stop   07/14/20 1445  vancomycin (VANCOREADY) IVPB 750 mg/150 mL        750 mg 150 mL/hr over 60 Minutes Intravenous  Once 07/14/20 1335 07/14/20 1553   07/14/20 1330  ceFEPIme (MAXIPIME) 2 g in sodium chloride 0.9 % 100 mL IVPB        2 g 200 mL/hr over 30 Minutes Intravenous  Once 07/14/20 1328 07/14/20 1444   07/14/20 1330  metroNIDAZOLE (FLAGYL) IVPB 500 mg        500 mg 100 mL/hr over 60 Minutes Intravenous  Once 07/14/20 1328 07/14/20 1600   07/14/20 1330  vancomycin (VANCOCIN) IVPB 1000 mg/200 mL premix  Status:  Discontinued        1,000 mg 200 mL/hr over 60 Minutes Intravenous  Once 07/14/20 1328 07/17/20 0952             Family Communication/Anticipated D/C date and plan/Code Status   DVT prophylaxis: enoxaparin (LOVENOX) injection 40 mg Start: 07/18/20 2200 Place and maintain sequential compression device Start: 07/17/20 1350 SCDs Start: 07/14/20 1508     Code Status: Full Code  Family Communication:  Disposition Plan:    Status is: Inpatient  Remains inpatient appropriate because:Unsafe d/c plan   Dispo:  Patient From: Home  Planned Disposition: Skilled Nursing Facility  Expected discharge date: 07/25/20  Medically stable for discharge: No            Subjective:    Interval events noted. He is more sleepy today. No complaints.  Objective:    Vitals:   07/24/20 0453 07/24/20 0453 07/24/20 0826 07/24/20 1221  BP:  (!) 154/66 (!) 149/73 125/62  Pulse:  69 65 77  Resp:  18 16 16   Temp:  98.9 F (37.2 C) 98.9 F (37.2 C) 98.7 F (37.1 C)  TempSrc:  Oral Oral Oral  SpO2:  93% 96% 97%  Weight: 80.6 kg     Height:       No data found.   Intake/Output Summary (Last 24 hours) at 07/24/2020 1308 Last data filed at 07/24/2020 0454 Gross per 24 hour  Intake 559.76 ml  Output 925 ml  Net -365.24 ml   Filed Weights   07/22/20 0351 07/23/20 0350 07/24/20 0453  Weight: 79.8 kg 81.7 kg 80.6 kg    Exam:  GEN: NAD SKIN: Warm and dry EYES: No pallor or icterus ENT: MMM CV: RRR PULM: CTA B ABD: soft, obese, NT, +BS CNS: Drowsy but arousable, non focal EXT: Bilateral upper and lower extremity (pedal) edema, no tenderness    Data Reviewed:   I have personally reviewed following labs and imaging studies:  Labs: Labs show the following:   Basic Metabolic Panel: Recent Labs  Lab 07/19/20 0518 07/19/20 0518 07/20/20 0531 07/20/20 0531 07/21/20 0351 07/21/20 0351 07/22/20 0459 07/22/20 0459 07/23/20 0515 07/24/20 0647  NA 146*   < > 147*  --  145  --  147*  --  149* 148*  K 3.6   < > 3.5   < > 3.4*   < > 3.7   < > 3.4* 3.4*  CL 111   < > 109  --  109  --  108  --  109 110  CO2 28   < > 29  --  28  --  29  --  30 32  GLUCOSE 104*   < > 119*  --  98  --  105*  --  105* 119*  BUN 14   < > 15  --  16  --  18  --  20 21  CREATININE 0.86   < > 0.98  --  1.00  --  1.01  --  1.01 0.93  CALCIUM 8.7*   < > 8.8*  --  8.7*  --  8.7*  --  8.4* 8.7*  MG 2.0  --   --   --  1.6*  --  1.8  --  1.8 2.1  PHOS  --   --  2.4*  --  3.3  --  3.2  --  3.0 3.2   < > =  values in this interval not displayed.   GFR Estimated Creatinine Clearance: 81.1 mL/min (by C-G formula based on SCr of 0.93 mg/dL). Liver Function Tests: No results for input(s):  AST, ALT, ALKPHOS, BILITOT, PROT, ALBUMIN in the last 168 hours. No results for input(s): LIPASE, AMYLASE in the last 168 hours. Recent Labs  Lab 07/17/20 1702  AMMONIA 11   Coagulation profile No results for input(s): INR, PROTIME in the last 168 hours.  CBC: Recent Labs  Lab 07/18/20 0525 07/20/20 0531 07/21/20 0351 07/23/20 0515  WBC 10.5 11.0* 11.4* 10.7*  NEUTROABS  --   --   --  8.1*  HGB 10.5* 10.7* 10.2* 10.2*  HCT 30.0* 32.6* 31.0* 30.5*  MCV 98.7 103.2* 100.6* 99.3  PLT 167 157 165 158   Cardiac Enzymes: No results for input(s): CKTOTAL, CKMB, CKMBINDEX, TROPONINI in the last 168 hours. BNP (last 3 results) No results for input(s): PROBNP in the last 8760 hours. CBG: Recent Labs  Lab 07/23/20 2024 07/24/20 0001 07/24/20 0337 07/24/20 0828 07/24/20 1222  GLUCAP 130* 128* 111* 117* 111*   D-Dimer: No results for input(s): DDIMER in the last 72 hours. Hgb A1c: No results for input(s): HGBA1C in the last 72 hours. Lipid Profile: No results for input(s): CHOL, HDL, LDLCALC, TRIG, CHOLHDL, LDLDIRECT in the last 72 hours. Thyroid function studies: No results for input(s): TSH, T4TOTAL, T3FREE, THYROIDAB in the last 72 hours.  Invalid input(s): FREET3 Anemia work up: No results for input(s): VITAMINB12, FOLATE, FERRITIN, TIBC, IRON, RETICCTPCT in the last 72 hours. Sepsis Labs: Recent Labs  Lab 07/18/20 0525 07/20/20 0531 07/21/20 0351 07/23/20 0515  PROCALCITON  --   --  <0.10  --   WBC 10.5 11.0* 11.4* 10.7*    Microbiology Recent Results (from the past 240 hour(s))  Blood Culture (routine x 2)     Status: None   Collection Time: 07/14/20  1:41 PM   Specimen: BLOOD  Result Value Ref Range Status   Specimen Description BLOOD BLOOD RIGHT FOREARM  Final   Special Requests   Final    BOTTLES DRAWN AEROBIC AND ANAEROBIC Blood Culture adequate volume   Culture   Final    NO GROWTH 5 DAYS Performed at [facility], [address],  [place], [place] [address]    Report Status 07/19/2020 FINAL  Final  Blood Culture (routine x 2)     Status: None   Collection Time: 07/14/20  1:41 PM   Specimen: BLOOD  Result Value Ref Range Status   Specimen Description BLOOD BLOOD RIGHT HAND  Final   Special Requests   Final    BOTTLES DRAWN AEROBIC AND ANAEROBIC Blood Culture adequate volume   Culture   Final    NO GROWTH 5 DAYS Performed at [facility], [address] [place], [place] [address]    Report Status 07/19/2020 FINAL  Final  Urine culture     Status: None   Collection Time: 07/14/20  1:41 PM   Specimen: In/Out Cath Urine  Result Value Ref Range Status   Specimen Description   Final    IN/OUT CATH URINE Performed at [facility], [address], [place], [place] [address]    Special Requests   Final    NONE Performed at [facility], [address], [place], [place] [address]    Culture   Final    NO GROWTH Performed at [facility], [address] [place], [place] [address]  Report Status 07/15/2020 FINAL  Final  SARS Coronavirus 2 by RT PCR (hospital order, performed in [facility] hospital lab) Nasopharyngeal Nasopharyngeal Swab     Status: None   Collection Time: 07/14/20  1:41 PM   Specimen: Nasopharyngeal Swab  Result Value Ref Range Status   SARS Coronavirus 2 NEGATIVE NEGATIVE Final    Comment: (NOTE) SARS-CoV-2 target nucleic acids are NOT DETECTED.  The SARS-CoV-2 RNA is generally detectable in upper and lower respiratory specimens during the acute phase of infection. The lowest concentration of SARS-CoV-2 viral copies this assay can detect is 250 copies / mL. A negative result does not preclude SARS-CoV-2 infection and should not be used as the sole basis for treatment or other patient management decisions.  A negative result may occur with improper specimen collection / handling, submission of specimen other than nasopharyngeal swab, presence of viral  mutation(s) within the areas targeted by this assay, and inadequate number of viral copies (<250 copies / mL). A negative result must be combined with clinical observations, patient history, and epidemiological information.  Fact Sheet for Patients:   BoilerBrush.com.cy  Fact Sheet for Healthcare Providers: https://pope.com/  This test is not yet approved or  cleared by the [place] FDA and has been authorized for detection and/or diagnosis of SARS-CoV-2 by FDA under an Emergency Use Authorization (EUA).  This EUA will remain in effect (meaning this test can be used) for the duration of the COVID-19 declaration under Section 564(b)(1) of the Act, 21 U.S.C. section 360bbb-3(b)(1), unless the authorization is terminated or revoked sooner.  Performed at [facility], [address] [place], [place] [address]   CSF culture     Status: None   Collection Time: 07/17/20  4:00 PM   Specimen: PATH Cytology CSF; Cerebrospinal Fluid  Result Value Ref Range Status   Specimen Description   Final    CSF Performed at [facility], [address] [place], [place] [address]    Special Requests   Final    CSF Performed at [facility], [address] [place], [place] [address]    Gram Stain   Final    NO ORGANISMS SEEN FEW RED BLOOD CELLS RARE WBC SEEN Performed at [facility], [address], [place], [place] [address]    Culture   Final    NO GROWTH 3 DAYS Performed at [facility], [address], [place], [place] [address]    Report Status 07/21/2020 FINAL  Final  Culture, fungus without smear     Status: None (Preliminary result)   Collection Time: 07/17/20  4:00 PM   Specimen: PATH Cytology CSF; Cerebrospinal Fluid  Result Value Ref Range Status   Specimen Description   Final    CSF Performed at [facility], [address], [place], [place] [address]    Special  Requests   Final    CSF Performed at [facility], [address], [place], [place] [address]    Culture   Final    NO FUNGUS ISOLATED AFTER 6 DAYS Performed at [facility], [address] [place], [place] [address]    Report Status PENDING  Incomplete    Procedures and diagnostic studies:  No results found.             LOS: 10 days   Aissata Wilmore  [facility]   Pager on [email or web address]. If 7PM-7AM, please contact night-coverage at [email or web address]     07/24/2020,  1:08 PM

## 2020-07-24 NOTE — Progress Notes (Addendum)
PHARMACY CONSULT NOTE - FOLLOW UP  Pharmacy Consult for Electrolyte Monitoring and Replacement   Recent Labs: Potassium (mmol/L)  Date Value  07/24/2020 3.4 (L)   Magnesium (mg/dL)  Date Value  15/17/6160 2.1   Calcium (mg/dL)  Date Value  73/71/0626 8.7 (L)   Albumin (g/dL)  Date Value  94/85/4627 3.8   Phosphorus (mg/dL)  Date Value  03/50/0938 3.2   Sodium (mmol/L)  Date Value  07/24/2020 148 (H)   Corr Ca: 8.9  Assessment: 69 year old male admitted for respiratory failure requiring intubation. PMH significant for chronic pain, depression, frequent falls, GERD, and hx of alcohol abuse. Patient was intubated 9/21 and extubated 9/22, remains somnolent and NPO as a result and placed on NG tube. Patient with electrolyte abnormalities, pharmacy consulted to replace.   9/27 NG tubed removed and patient participated in swallow evaluation. Patient diet advance from NPO to dysphagia diet (puree) and is able to tolerate PO meds.   Na continues to increase 147>149 after free water was discontinued. Likely from low PO intake. Patient started on D5W @75ml /hr.  Na  Goal of Therapy:  Electrolytes WNL  Plan:  K 3.4 - MD ordered KCl 182>993>716 x3 MD has been replacing electrolytes; Pharmacy will sign off at this time.  , PharmD Pharmacy Resident  07/24/2020 9:23 AM

## 2020-07-25 DIAGNOSIS — J9601 Acute respiratory failure with hypoxia: Secondary | ICD-10-CM | POA: Diagnosis not present

## 2020-07-25 DIAGNOSIS — R400 Somnolence: Secondary | ICD-10-CM | POA: Diagnosis not present

## 2020-07-25 DIAGNOSIS — F331 Major depressive disorder, recurrent, moderate: Secondary | ICD-10-CM | POA: Diagnosis not present

## 2020-07-25 LAB — GLUCOSE, CAPILLARY
Glucose-Capillary: 103 mg/dL — ABNORMAL HIGH (ref 70–99)
Glucose-Capillary: 103 mg/dL — ABNORMAL HIGH (ref 70–99)
Glucose-Capillary: 109 mg/dL — ABNORMAL HIGH (ref 70–99)
Glucose-Capillary: 92 mg/dL (ref 70–99)
Glucose-Capillary: 96 mg/dL (ref 70–99)

## 2020-07-25 LAB — BASIC METABOLIC PANEL
Anion gap: 3 — ABNORMAL LOW (ref 5–15)
BUN: 18 mg/dL (ref 8–23)
CO2: 32 mmol/L (ref 22–32)
Calcium: 8.7 mg/dL — ABNORMAL LOW (ref 8.9–10.3)
Chloride: 109 mmol/L (ref 98–111)
Creatinine, Ser: 0.93 mg/dL (ref 0.61–1.24)
GFR calc Af Amer: >60 mL/min (ref >60)
GFR calc non Af Amer: >60 mL/min (ref >60)
Glucose, Bld: 122 mg/dL — ABNORMAL HIGH (ref 70–99)
Potassium: 4.3 mmol/L (ref 3.5–5.1)
Sodium: 144 mmol/L (ref 135–145)

## 2020-07-25 LAB — PHOSPHORUS: Phosphorus: 2.8 mg/dL (ref 2.5–4.6)

## 2020-07-25 LAB — MAGNESIUM: Magnesium: 1.9 mg/dL (ref 1.7–2.4)

## 2020-07-25 NOTE — Plan of Care (Signed)
Discussed in front of the patient plan of care for the evening, pain management and encouraged to drink and eat with bedtime medications with some evidence of following instructions

## 2020-07-25 NOTE — Progress Notes (Signed)
Progress Note    Joseph Owen  WPV:[id number] DOB: [birth year]  DOA: 07/14/2020 PCP: Jaclyn Shaggy, MD      Brief Narrative:    Medical records reviewed and are as summarized below:  Joseph Owen is a 69 y.o. male with past history of chronic pain, depression with previous suicide attempt requiring admission to the psychiatric unit, history of frequent fall and unsteadiness which has been worked up by neurologist in the past, alcohol abuse and GERD(found to have some esophageal stenosis which was dilated in July 2021) presented to ED after being found unresponsive on the bathroom floor with the tub overflowing with water.  In the emergency room, he was found to be unresponsive and severely hypoxic.  He was emergently intubated and placed on mechanical ventilation and was managed in the intensive care unit.  He had significant leukocytosis but there was no obvious source of infection.  Urine drug screen was positive for benzodiazepines.  Etiology of acute hypoxemic respiratory failure was not clearly established.  He was extubated on 07/15/2020 and was transferred to the [facility] team 07/16/2020.  Of note, he required enteral nutrition via NG tube because of severe encephalopathy and inability to to eat by mouth.  He also had AKI and hypernatremia which resolved with IV fluids.  He was evaluated by the neurologist because of altered mental status/encephalopathy.  Differential diagnosis of encephalopathy include Warnicke's encephalopathy/Korsakoff syndrome.  He was also evaluated by the psychiatrist because of history of severe depression.  Lexapro was recommended.  He did not meet criteria for management at the inpatient behavioral health unit.  He was evaluated by PT and OT recommended further rehabilitation at the skilled nursing facility.    Assessment/Plan:   Active Problems:   Respiratory failure (HCC)   Glasgow coma scale total score 3-8 (HCC)   AKI (acute kidney  injury) (HCC)   Swelling of arm   Altered mental status   Major depressive disorder, recurrent episode, moderate (HCC)   Nutrition Problem: Inadequate oral intake Etiology: lethargy/confusion  Signs/Symptoms: meal completion < 50%   Body mass index is 23.8 kg/m.    PLAN  Discontinue IV fluids since hyponatremia has improved  Discussed enteral nutrition via NG tube with the patient because of poor oral intake.  However, he refused enteral nutrition and said he would continue to try to take food by mouth.  Continue antihypertensives for hypertension  Continue antidepressants for depression  Continue thiamine and vitamins for alcohol use disorder  Awaiting placement to SNF.    Diet Order            DIET - DYS 1 Room service appropriate? Yes; Fluid consistency: Nectar Thick  Diet effective now                    Consultants:  Intensivist  Neurologist  Psychiatrist  Procedures:      Medications:   . Chlorhexidine Gluconate Cloth  6 each Topical Daily  . enoxaparin (LOVENOX) injection  40 mg Subcutaneous Q24H  . escitalopram  5 mg Oral Daily  . famotidine  20 mg Oral BID  . feeding supplement (ENSURE ENLIVE)  237 mL Oral TID BM  . folic acid  1 mg Oral Daily  . gabapentin  100 mg Oral Q8H  . losartan  25 mg Oral Daily  . mouth rinse  15 mL Mouth Rinse BID  . mirtazapine  15 mg Oral QHS  . multivitamin with minerals  1 tablet Oral Daily  . nicotine  14 mg Transdermal Daily  . pantoprazole sodium  20 mg Oral Daily  . sodium chloride flush  10-40 mL Intracatheter Q12H  . sodium chloride flush  3 mL Intravenous Q12H  . thiamine  100 mg Oral Daily   Continuous Infusions: . sodium chloride Stopped (07/22/20 1150)     Anti-infectives (From admission, onward)   Start     Dose/Rate Route Frequency Ordered Stop   07/14/20 1445  vancomycin (VANCOREADY) IVPB 750 mg/150 mL        750 mg 150 mL/hr over 60 Minutes Intravenous  Once 07/14/20 1335  07/14/20 1553   07/14/20 1330  ceFEPIme (MAXIPIME) 2 g in sodium chloride 0.9 % 100 mL IVPB        2 g 200 mL/hr over 30 Minutes Intravenous  Once 07/14/20 1328 07/14/20 1444   07/14/20 1330  metroNIDAZOLE (FLAGYL) IVPB 500 mg        500 mg 100 mL/hr over 60 Minutes Intravenous  Once 07/14/20 1328 07/14/20 1600   07/14/20 1330  vancomycin (VANCOCIN) IVPB 1000 mg/200 mL premix  Status:  Discontinued        1,000 mg 200 mL/hr over 60 Minutes Intravenous  Once 07/14/20 1328 07/17/20 0952             Family Communication/Anticipated D/C date and plan/Code Status   DVT prophylaxis: enoxaparin (LOVENOX) injection 40 mg Start: 07/18/20 2200 Place and maintain sequential compression device Start: 07/17/20 1350 SCDs Start: 07/14/20 1508     Code Status: Full Code  Family Communication:  Disposition Plan:    Status is: Inpatient  Remains inpatient appropriate because:Unsafe d/c plan   Dispo:  Patient From: Home  Planned Disposition: Skilled Nursing Facility  Expected discharge date: 07/27/20  Medically stable for discharge: Yes            Subjective:   Interval events noted.  He has no complaints.  Objective:    Vitals:   07/25/20 0310 07/25/20 0739 07/25/20 1201 07/25/20 1532  BP:  138/78 112/61 (!) 143/72  Pulse: 85 72 68 80  Resp:  17 18 17   Temp:  97.8 F (36.6 C) 98.2 F (36.8 C) 98 F (36.7 C)  TempSrc:  Oral Oral Oral  SpO2: 94% 97% 98% 100%  Weight:      Height:       No data found.   Intake/Output Summary (Last 24 hours) at 07/25/2020 1803 Last data filed at 07/25/2020 1534 Gross per 24 hour  Intake 2445.33 ml  Output 1350 ml  Net 1095.33 ml   Filed Weights   07/23/20 0350 07/24/20 0453 07/25/20 0300  Weight: 81.7 kg 80.6 kg 79.6 kg    Exam:  GEN: NAD SKIN: Warm and dry. EYES: No pallor or icterus ENT: MMM CV: RRR PULM: CTA B ABD: soft, ND, NT, +BS CNS: Drowsy but arousable and oriented.  No focal deficits. EXT:  Bilateral upper extremity and pedal edema seem to be improving.   Data Reviewed:   I have personally reviewed following labs and imaging studies:  Labs: Labs show the following:   Basic Metabolic Panel: Recent Labs  Lab 07/21/20 0351 07/21/20 0351 07/22/20 0459 07/22/20 0459 07/23/20 0515 07/23/20 0515 07/24/20 0647 07/25/20 0522  NA 145  --  147*  --  149*  --  148* 144  K 3.4*   < > 3.7   < > 3.4*   < > 3.4* 4.3  CL 109  --  108  --  109  --  110 109  CO2 28  --  29  --  30  --  32 32  GLUCOSE 98  --  105*  --  105*  --  119* 122*  BUN 16  --  18  --  20  --  21 18  CREATININE 1.00  --  1.01  --  1.01  --  0.93 0.93  CALCIUM 8.7*  --  8.7*  --  8.4*  --  8.7* 8.7*  MG 1.6*  --  1.8  --  1.8  --  2.1 1.9  PHOS 3.3  --  3.2  --  3.0  --  3.2 2.8   < > = values in this interval not displayed.   GFR Estimated Creatinine Clearance: 81.1 mL/min (by C-G formula based on SCr of 0.93 mg/dL). Liver Function Tests: No results for input(s): AST, ALT, ALKPHOS, BILITOT, PROT, ALBUMIN in the last 168 hours. No results for input(s): LIPASE, AMYLASE in the last 168 hours. No results for input(s): AMMONIA in the last 168 hours. Coagulation profile No results for input(s): INR, PROTIME in the last 168 hours.  CBC: Recent Labs  Lab 07/20/20 0531 07/21/20 0351 07/23/20 0515  WBC 11.0* 11.4* 10.7*  NEUTROABS  --   --  8.1*  HGB 10.7* 10.2* 10.2*  HCT 32.6* 31.0* 30.5*  MCV 103.2* 100.6* 99.3  PLT 157 165 158   Cardiac Enzymes: No results for input(s): CKTOTAL, CKMB, CKMBINDEX, TROPONINI in the last 168 hours. BNP (last 3 results) No results for input(s): PROBNP in the last 8760 hours. CBG: Recent Labs  Lab 07/24/20 2335 07/25/20 0344 07/25/20 0740 07/25/20 1203 07/25/20 1610  GLUCAP 127* 109* 103* 96 92   D-Dimer: No results for input(s): DDIMER in the last 72 hours. Hgb A1c: No results for input(s): HGBA1C in the last 72 hours. Lipid Profile: No results for  input(s): CHOL, HDL, LDLCALC, TRIG, CHOLHDL, LDLDIRECT in the last 72 hours. Thyroid function studies: No results for input(s): TSH, T4TOTAL, T3FREE, THYROIDAB in the last 72 hours.  Invalid input(s): FREET3 Anemia work up: No results for input(s): VITAMINB12, FOLATE, FERRITIN, TIBC, IRON, RETICCTPCT in the last 72 hours. Sepsis Labs: Recent Labs  Lab 07/20/20 0531 07/21/20 0351 07/23/20 0515  PROCALCITON  --  <0.10  --   WBC 11.0* 11.4* 10.7*    Microbiology Recent Results (from the past 240 hour(s))  CSF culture     Status: None   Collection Time: 07/17/20  4:00 PM   Specimen: PATH Cytology CSF; Cerebrospinal Fluid  Result Value Ref Range Status   Specimen Description   Final    CSF Performed at [facility], [address] [place], [place] [address]    Special Requests   Final    CSF Performed at [facility], [address] [place], [place] [address]    Gram Stain   Final    NO ORGANISMS SEEN FEW RED BLOOD CELLS RARE WBC SEEN Performed at [facility], [address] [place], [place] [address]    Culture   Final    NO GROWTH 3 DAYS Performed at [facility], [address] [place], [place] [address]    Report Status 07/21/2020 FINAL  Final  Culture, fungus without smear     Status: None (Preliminary result)   Collection Time: 07/17/20  4:00 PM   Specimen: PATH Cytology CSF; Cerebrospinal Fluid  Result Value Ref Range  Status   Specimen Description   Final    CSF Performed at [facility], [address] [place], [place] [address]    Special Requests   Final    CSF Performed at [facility], [address] [place], [place] [address]    Culture   Final    NO FUNGUS ISOLATED AFTER 8 DAYS Performed at [facility], [address] [place], [place] [address]    Report Status PENDING  Incomplete    Procedures and diagnostic studies:  No results found.             LOS: 11 days    Jarone Ostergaard  [facility] Chartered loss adjuster on [email or web address]. If 7PM-7AM, please contact night-coverage at [email or web address]     07/25/2020, 6:03 PM

## 2020-07-26 DIAGNOSIS — N179 Acute kidney failure, unspecified: Secondary | ICD-10-CM | POA: Diagnosis not present

## 2020-07-26 DIAGNOSIS — J9601 Acute respiratory failure with hypoxia: Secondary | ICD-10-CM | POA: Diagnosis not present

## 2020-07-26 LAB — GLUCOSE, CAPILLARY
Glucose-Capillary: 110 mg/dL — ABNORMAL HIGH (ref 70–99)
Glucose-Capillary: 95 mg/dL (ref 70–99)
Glucose-Capillary: 106 mg/dL — ABNORMAL HIGH (ref 70–99)
Glucose-Capillary: 117 mg/dL — ABNORMAL HIGH (ref 70–99)
Glucose-Capillary: 125 mg/dL — ABNORMAL HIGH (ref 70–99)

## 2020-07-26 LAB — MAGNESIUM: Magnesium: 1.7 mg/dL (ref 1.7–2.4)

## 2020-07-26 LAB — BASIC METABOLIC PANEL
Anion gap: 5 (ref 5–15)
BUN: 22 mg/dL (ref 8–23)
CO2: 33 mmol/L — ABNORMAL HIGH (ref 22–32)
Calcium: 8.5 mg/dL — ABNORMAL LOW (ref 8.9–10.3)
Chloride: 108 mmol/L (ref 98–111)
Creatinine, Ser: 0.84 mg/dL (ref 0.61–1.24)
GFR calc Af Amer: >60 mL/min (ref >60)
GFR calc non Af Amer: >60 mL/min (ref >60)
Glucose, Bld: 102 mg/dL — ABNORMAL HIGH (ref 70–99)
Potassium: 4.1 mmol/L (ref 3.5–5.1)
Sodium: 146 mmol/L — ABNORMAL HIGH (ref 135–145)

## 2020-07-26 MED ORDER — MAGNESIUM SULFATE 2 GM/50ML IV SOLN
2.0000 g | Freq: Once | INTRAVENOUS | Status: AC
  Administered 2020-07-26: 2 g via INTRAVENOUS
  Filled 2020-07-26: qty 50

## 2020-07-26 NOTE — Progress Notes (Signed)
PROGRESS NOTE  PCCM transfer.  Joseph Owen  ZOX:[id number] DOB: 04-May-1951 DOA: 07/14/2020 PCP: Jaclyn Shaggyate, Denny C, MD   Brief Narrative: Taken from H&P.  Patient is unable to provide much detail. Joseph Owen is a 69 y.o. male with a past history of chronic pain, depression chronic history of frequent fall and unsteadiness which has been worked up by neurologist in the past, alcohol abuse and GERD(found to have some esophageal stenosis which was dilated in July 2021) presented to ED after being found unresponsive on the bathroom floor with the tub overflowing with water.  Patient lives alone and family last talked with him 24 hours ago.  Unable to obtained significant history.  On arrival he was significantly hypoxic and unresponsive requiring emergent intubation.  Extubated on 07/15/2020 and care was transitioned to [facility] on 07/16/2020.  EKG with sinus tachycardia but was without any significant ST changes.  On arrival labs are positive for leukocytosis with WBC count of 27.7 and neutrophils of 24.9, no obvious source of infection, may be secondary to stress with fall and being unresponsive, multiple electrolyte abnormalities which include hypokalemia, hypomagnesemia and hypophosphatemia.  AKI with creatinine of 1.5.  Baseline around 1.1-1.2.  Lactic acid of 4-never checked again. Urine, blood cultures and CSF cultures negative.  Toxicology only positive for benzos in UDS.  CK elevated at 1100. Troponin mildly elevated, more consistent with demand.  CT head and cervical spine was without any acute abnormality.  Echocardiogram with normal EF and no wall motion abnormalities.  Apparently patient has an history of severe depression and he was admitted at behavioral health with suicidal attempt over the summer.  He was evaluated by psychiatry during current hospitalization and they do not think that he needs inpatient behavioral health and recommending starting him on Lexapro.  Sepsis was noted in ED  note but ruled out as there is no source of infection. All cultures include CSF was negative.  EEG with severe encephalopathy and no seizure-like activity.  Confabulations noted after couple of days when he was little more awake.  Completed the course of high-dose thiamine for concern of Wernicke's/Korsakoff encephalopathy.  Subjective: Patient was feeling better when seen today.  No new complaint.  He was able to answer some orientation questions, stating that he is in hospital.  He was concerned that his family should know what is going on with him.  Able to eat but still requires assistance.  Assessment & Plan:   Active Problems:   Respiratory failure (HCC)   Glasgow coma scale total score 3-8 (HCC)   AKI (acute kidney injury) (HCC)   Swelling of arm   Altered mental status   Major depressive disorder, recurrent episode, moderate (HCC)  Acute hypoxic respiratory failure.  Resolved  Unknown etiology.  Toxicology was pretty much negative except benzos in UDS.  Patient has an history of suicidal attempt secondary to severe depression and was admitted in behavioral health over the summer.  Not sure whether urine was obtained after he received medications for intubation. Patient is extubated now and was saturating well on room air.  Encephalopathy/severe depression. Wernicke's/Korsakoff.  On chart review he has an history of frequent falls and unsteadiness for many years and also being evaluated by a neurologist with no diagnosis and was referred for physical therapy. Neurology was consulted and they signed off as imaging was negative for any acute changes.EEG -shows diffuse encephalopathy. After 4 days he become little more alert and started talking which was consistent with  confabulations.-Concern of Wernicke's/Korsakoff because of his history of alcohol abuse.  Patient was already receiving thiamine 100 mg daily since admission.  He was started on high-dose thiamine and completed the  course. -Continue to monitor. -Continue thiamine at 100 mg daily. -PT/OT evaluation are recommending SNF placement-TOC is working on placement.  Tube feeding.  Discontinued on 07/20/2020. NG tube was placed after 4 days of hospitalization with continuation of severe encephalopathy and unable to take any p.o. he did receive 2 feet for 2 days.   Swallow evaluation is recommending dysphagia diet.  Patient is able to eat but still requires some assistance.  Sepsis ruled out as there is no obvious source of infection and leukocytosis resolved.  Elevated troponin.  EKG without any acute changes.  Barely positive troponin most likely secondary to demand with a flat curve.  Electrolyte abnormalities.  Resolved Patient found to have multiple electrolyte abnormalities which includes hypokalemia, hypomagnesemia and hypophosphatemia.  Did develop some hypernatremia secondary to poor p.o. intake being managed initially with D5 and then later with free water through tube feed. -Being managed by pharmacy. -Replete as needed and monitor.  Right upper extremity edema.  Venous Doppler obtained yesterday for the concern of right upper extremity edema which was negative for DVT.  AKI with CKD stage IIIa.  Resolved with IV hydration.  Creatinine below 1 now. -Continue to monitor. -Avoid nephrotoxins.  Depression.  His current hospitalization might be secondary to exacerbation of his severe depression. -Psych was consulted -recommending starting Lexapro and there is no need for inpatient behavioral health. -Continue home dose of Remeron-restarted today.  Objective: Vitals:   07/26/20 0323 07/26/20 0739 07/26/20 1131 07/26/20 1516  BP: 136/79 (!) 154/87 (!) 155/90 121/72  Pulse: 92 67 71 81  Resp: 19 17 16 20   Temp: 98.7 F (37.1 C) 97.9 F (36.6 C) 98.3 F (36.8 C) 98.9 F (37.2 C)  TempSrc: Oral Oral Oral Oral  SpO2: 94% 95% 96% 99%  Weight:      Height:        Intake/Output Summary (Last 24  hours) at 07/26/2020 1543 Last data filed at 07/26/2020 1200 Gross per 24 hour  Intake 630 ml  Output 650 ml  Net -20 ml   Filed Weights   07/24/20 0453 07/25/20 0300 07/26/20 0312  Weight: 80.6 kg 79.6 kg 80.3 kg    Examination:  General.  Chronically ill-appearing gentleman, in no acute distress. Pulmonary.  Lungs clear bilaterally, normal respiratory effort. CV.  Regular rate and rhythm, no JVD, rub or murmur. Abdomen.  Soft, nontender, nondistended, BS positive. CNS.  Alert and oriented .  No focal neurologic deficit. Extremities.  1+ bilateral UEs and bilateral feet edema, no cyanosis, pulses intact and symmetrical. Psychiatry.  Judgment and insight appears impaired.  DVT prophylaxis: Lovenox Code Status: Full Family Communication: No family at bedside. Disposition Plan:  Status is: Inpatient  Remains inpatient appropriate because:Inpatient level of care appropriate due to severity of illness   Dispo: The patient is from: Home              Anticipated d/c is to: SNF              Anticipated d/c date is: 1-2 days.              Patient currently is not medically stable to d/c.  Consultants:   PCCM  Neurology  Psychiatry.  Procedures:  Antimicrobials:   Data Reviewed: I have personally reviewed following labs and imaging  studies  CBC: Recent Labs  Lab 07/20/20 0531 07/21/20 0351 07/23/20 0515  WBC 11.0* 11.4* 10.7*  NEUTROABS  --   --  8.1*  HGB 10.7* 10.2* 10.2*  HCT 32.6* 31.0* 30.5*  MCV 103.2* 100.6* 99.3  PLT 157 165 158   Basic Metabolic Panel: Recent Labs  Lab 07/21/20 0351 07/21/20 0351 07/22/20 0459 07/23/20 0515 07/24/20 0647 07/25/20 0522 07/26/20 0454  NA 145   < > 147* 149* 148* 144 146*  K 3.4*   < > 3.7 3.4* 3.4* 4.3 4.1  CL 109   < > 108 109 110 109 108  CO2 28   < > 29 30 32 32 33*  GLUCOSE 98   < > 105* 105* 119* 122* 102*  BUN 16   < > 18 20 21 18 22   CREATININE 1.00   < > 1.01 1.01 0.93 0.93 0.84  CALCIUM 8.7*   < >  8.7* 8.4* 8.7* 8.7* 8.5*  MG 1.6*   < > 1.8 1.8 2.1 1.9 1.7  PHOS 3.3  --  3.2 3.0 3.2 2.8  --    < > = values in this interval not displayed.   GFR: Estimated Creatinine Clearance: 89.8 mL/min (by C-G formula based on SCr of 0.84 mg/dL). Liver Function Tests: No results for input(s): AST, ALT, ALKPHOS, BILITOT, PROT, ALBUMIN in the last 168 hours. No results for input(s): LIPASE, AMYLASE in the last 168 hours. No results for input(s): AMMONIA in the last 168 hours. Coagulation Profile: No results for input(s): INR, PROTIME in the last 168 hours. Cardiac Enzymes: No results for input(s): CKTOTAL, CKMB, CKMBINDEX, TROPONINI in the last 168 hours. BNP (last 3 results) No results for input(s): PROBNP in the last 8760 hours. HbA1C: No results for input(s): HGBA1C in the last 72 hours. CBG: Recent Labs  Lab 07/25/20 1610 07/25/20 2349 07/26/20 0320 07/26/20 0737 07/26/20 1129  GLUCAP 92 103* 110* 95 106*   Lipid Profile: No results for input(s): CHOL, HDL, LDLCALC, TRIG, CHOLHDL, LDLDIRECT in the last 72 hours. Thyroid Function Tests: No results for input(s): TSH, T4TOTAL, FREET4, T3FREE, THYROIDAB in the last 72 hours. Anemia Panel: No results for input(s): VITAMINB12, FOLATE, FERRITIN, TIBC, IRON, RETICCTPCT in the last 72 hours. Sepsis Labs: Recent Labs  Lab 07/21/20 0351  PROCALCITON <0.10    Recent Results (from the past 240 hour(s))  CSF culture     Status: None   Collection Time: 07/17/20  4:00 PM   Specimen: PATH Cytology CSF; Cerebrospinal Fluid  Result Value Ref Range Status   Specimen Description   Final    CSF Performed at [facility], [address] [place], [place]    Special Requests   Final    CSF Performed at [facility], [address] [place], [place]    Gram Stain   Final    NO ORGANISMS SEEN FEW RED BLOOD CELLS RARE WBC SEEN Performed at [facility], [address], [place], [place]    Culture   Final    NO GROWTH 3 DAYS Performed at [facility], [address], [place], [place]    Report Status 07/21/2020 FINAL  Final  Culture, fungus without smear     Status: None (Preliminary result)   Collection Time: 07/17/20  4:00 PM   Specimen: PATH Cytology CSF; Cerebrospinal Fluid  Result Value Ref Range Status   Specimen Description   Final  CSF Performed at [facility], [address], [place], [place] [address]    Special Requests   Final    CSF Performed at [facility], [address], [place], [place] [address]    Culture   Final    NO FUNGUS ISOLATED AFTER 9 DAYS Performed at [facility], [address], [place], [place] [address]    Report Status PENDING  Incomplete     Radiology Studies: No results found.  Scheduled Meds: . Chlorhexidine Gluconate Cloth  6 each Topical Daily  . enoxaparin (LOVENOX) injection  40 mg Subcutaneous Q24H  . escitalopram  5 mg Oral Daily  . famotidine  20 mg Oral BID  . feeding supplement (ENSURE ENLIVE)  237 mL Oral TID BM  . folic acid  1 mg Oral Daily  . gabapentin  100 mg Oral Q8H  . losartan  25 mg Oral Daily  . mouth rinse  15 mL Mouth Rinse BID  . mirtazapine  15 mg Oral QHS  . multivitamin with minerals  1 tablet Oral Daily  . nicotine  14 mg Transdermal Daily  . pantoprazole sodium  20 mg Oral Daily  . sodium chloride flush  10-40 mL Intracatheter Q12H  . sodium chloride flush  3 mL Intravenous Q12H  . thiamine  100 mg Oral Daily   Continuous Infusions: . sodium chloride Stopped (07/22/20 1150)     LOS: 12 days   Time spent: 30 minutes.  Arnetha Courser, MD [facility]  If 7PM-7AM, please contact night-coverage [email or web address]  07/26/2020, 3:43 PM   This record has been created using Conservation officer, historic buildings. Errors have been sought and corrected,but may not always be located. Such creation errors do not reflect on the standard of  care.

## 2020-07-26 NOTE — Plan of Care (Signed)
Discussed with patient plan of care for the evening, pain management and possible getting up to chair tomorrow with some teach back displayed

## 2020-07-27 DIAGNOSIS — L8990 Pressure ulcer of unspecified site, unspecified stage: Secondary | ICD-10-CM | POA: Insufficient documentation

## 2020-07-27 DIAGNOSIS — N179 Acute kidney failure, unspecified: Secondary | ICD-10-CM | POA: Diagnosis not present

## 2020-07-27 DIAGNOSIS — J9601 Acute respiratory failure with hypoxia: Secondary | ICD-10-CM | POA: Diagnosis not present

## 2020-07-27 LAB — BASIC METABOLIC PANEL
Anion gap: 6 (ref 5–15)
BUN: 26 mg/dL — ABNORMAL HIGH (ref 8–23)
CO2: 33 mmol/L — ABNORMAL HIGH (ref 22–32)
Calcium: 8.7 mg/dL — ABNORMAL LOW (ref 8.9–10.3)
Chloride: 107 mmol/L (ref 98–111)
Creatinine, Ser: 0.97 mg/dL (ref 0.61–1.24)
GFR calc Af Amer: >60 mL/min (ref >60)
GFR calc non Af Amer: >60 mL/min (ref >60)
Glucose, Bld: 103 mg/dL — ABNORMAL HIGH (ref 70–99)
Potassium: 4.3 mmol/L (ref 3.5–5.1)
Sodium: 146 mmol/L — ABNORMAL HIGH (ref 135–145)

## 2020-07-27 LAB — CBC
HCT: 29.9 % — ABNORMAL LOW (ref 39.0–52.0)
Hemoglobin: 9.7 g/dL — ABNORMAL LOW (ref 13.0–17.0)
MCH: 33.1 pg (ref 26.0–34.0)
MCHC: 32.4 g/dL (ref 30.0–36.0)
MCV: 102 fL — ABNORMAL HIGH (ref 80.0–100.0)
Platelets: 233 10*3/uL (ref 150–400)
RBC: 2.93 MIL/uL — ABNORMAL LOW (ref 4.22–5.81)
RDW: 13.2 % (ref 11.5–15.5)
WBC: 11.1 10*3/uL — ABNORMAL HIGH (ref 4.0–10.5)
nRBC: 0 % (ref 0.0–0.2)

## 2020-07-27 LAB — GLUCOSE, CAPILLARY
Glucose-Capillary: 105 mg/dL — ABNORMAL HIGH (ref 70–99)
Glucose-Capillary: 106 mg/dL — ABNORMAL HIGH (ref 70–99)
Glucose-Capillary: 137 mg/dL — ABNORMAL HIGH (ref 70–99)
Glucose-Capillary: 94 mg/dL (ref 70–99)
Glucose-Capillary: 96 mg/dL (ref 70–99)
Glucose-Capillary: 98 mg/dL (ref 70–99)

## 2020-07-27 LAB — VITAMIN B12: Vitamin B-12: 428 pg/mL (ref 180–914)

## 2020-07-27 LAB — VITAMIN B1: Vitamin B1 (Thiamine): 211.8 nmol/L — ABNORMAL HIGH (ref 66.5–200.0)

## 2020-07-27 MED ORDER — CYANOCOBALAMIN 1000 MCG/ML IJ SOLN
1000.0000 ug | Freq: Once | INTRAMUSCULAR | Status: AC
  Administered 2020-07-27: 1000 ug via INTRAMUSCULAR
  Filled 2020-07-27: qty 1

## 2020-07-27 MED ORDER — DEXTROSE 5 % IV SOLN: INTRAVENOUS | Status: AC

## 2020-07-27 NOTE — TOC Initial Note (Signed)
Transition of Care [facility]) - Initial/Assessment Note    Patient Details  Name: Joseph Owen MRN: [id number] Date of Birth: 1951/09/05  Transition of Care [facility]) CM/SW Contact:    Shawn Route, RN Phone Number: 07/27/2020, 10:03 AM  Clinical Narrative:                  Daughter Jolyn Nap returned called.  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  Brother lives in [place] and daughter lives in [place].  She is in agreement with SNF placement in [place] or [place].  Will complete Passr and start Bed Search   Expected Discharge Plan: Skilled Nursing Facility Barriers to Discharge: Continued Medical Work up   Patient Goals and CMS Choice   CMS Medicare.gov Compare Post Acute Care list provided to:: Patient Represenative (must comment) (Daughter Jolyn Nap.)    Expected Discharge Plan and Services Expected Discharge Plan: Skilled Nursing Facility In-house Referral: Clinical Social Work Discharge Planning Services: CM Consult   Living arrangements for the past 2 months: Holiday representative                                      Prior Living Arrangements/Services Living arrangements for the past 2 months: Holiday representative Lives with:: Self   Do you feel safe going back to the place where you live?: No               Activities of Daily Living Home Assistive Devices/Equipment: None ADL Screening (condition at time of admission) Patient's cognitive ability adequate to safely complete daily activities?: Yes Is the patient deaf or have difficulty hearing?: No Does the patient have difficulty seeing, even when wearing glasses/contacts?: No Does the patient have difficulty concentrating, remembering, or making decisions?: No Patient able to express need for assistance with ADLs?: Yes Does the patient have difficulty dressing or bathing?: No Independently  performs ADLs?: Yes (appropriate for developmental age) Communication:  (UTA) Dressing (OT):  (UTA) Grooming:  (UTA) Feeding:  (UTA) Bathing:  (UTA) Toileting:  (UTA) In/Out Bed:  (UTA) Walks in Home:  (UTA) Does the patient have difficulty walking or climbing stairs?: Yes Weakness of Legs: Both Weakness of Arms/Hands: Both  Permission Sought/Granted                  Emotional Assessment       Orientation: : Oriented to Self Alcohol / Substance Use: Not Applicable Psych Involvement: No (comment)  Admission diagnosis:  Respiratory failure (HCC) [J96.90] AKI (acute kidney injury) (HCC) [N17.9] Septic shock (HCC) [A41.9, R65.21] Glasgow coma scale total score 3-8, at arrival to emergency department [facility]) [B35.3299] Patient Active Problem List   Diagnosis Date Noted  . Pressure injury of skin 07/27/2020  . Major depressive disorder, recurrent episode, moderate (HCC) 07/18/2020  . Altered mental status   . Glasgow coma scale total score 3-8 (HCC)   . AKI (acute kidney injury) (HCC)   . Swelling of arm   . Respiratory failure (HCC) 07/14/2020  . Falls 03/08/2016  . Left foot drop 03/08/2016   PCP:  Jaclyn Shaggy, MD Pharmacy:   [facility] [phone] [place], [place] - [address] AT [facility] [address] [place] [address] Phone: [phone] Fax: [phone]  Social Determinants of Health (SDOH) Interventions    Readmission Risk Interventions No flowsheet data found.

## 2020-07-27 NOTE — Progress Notes (Signed)
Physical Therapy Treatment Patient Details Name: BEACHER EVERY MRN: [id number] DOB: 06-04-1951 Today's Date: 07/27/2020    History of Present Illness Pt is a 69 y/o M with onset of syncopal episode at home was found by EMS, was hypothermic, hypoxic, in AKI and in hypercapnic resp failure.  Intubated, extubated, found to have encephalopathy, cleared for seizures, had tachycardia and had benzodiazepine in his system.  At home was falling frequently, has been depressed and had summer incident of [facility] admit for suicide attempt.  PMHx:  foot drop, chronic pain, GERD, CKD 3    PT Comments    Patient more alert this session, reported pain in R shoulder and with neck movement. Pt with delayed response and needed extended time to answer, oriented to self, and place (as in hospital, disoriented to state/town), knew it was towards the end of September. The patient was able to participate in several LE exercises AAROM, more muscle activation noted than previously. Supine to sit maxA with pt able to reach and assist with LE. TotalA to reposition at EOB, progressed from modA to maintain balance to CGA/supervision for 1-24minutes. Pt with significant trouble lifting his head against gravity. Returned to supine modAx2 due to patient fatigue. Bed placed in near chair position, and PT assisted pt with drinking at patient request. The patient would benefit from further skilled PT intervention to continue to progress towards goals. Recommendation remains appropriate.     Follow Up Recommendations  SNF     Equipment Recommendations  Other (comment) (TBD at next venue of care)    Recommendations for Other Services       Precautions / Restrictions Restrictions Weight Bearing Restrictions: No    Mobility  Bed Mobility Overal bed mobility: Needs Assistance Bed Mobility: Supine to Sit;Sit to Supine     Supine to sit: Max assist;HOB elevated Sit to supine: Mod assist;+2 for physical assistance   General  bed mobility comments: pt able to reach for PT and initiate leg movements to transfer to sitting, totalA to reposition in midline at EOB.  Transfers                 General transfer comment: deferred  Ambulation/Gait                 Stairs             Wheelchair Mobility    Modified Rankin (Stroke Patients Only)       Balance Overall balance assessment: Needs assistance Sitting-balance support: Feet supported Sitting balance-Leahy Scale: Fair Sitting balance - Comments: with time and repositioning (totalA) pt able to sit EOB for 1-41minutes with CGA/supervision, initially min-modA.       Standing balance comment: deferred                            Cognition Arousal/Alertness: Awake/alert Behavior During Therapy: Flat affect Overall Cognitive Status: Impaired/Different from baseline                                 General Comments: Pt more alert, able to state his name, location, year, knew it was close to end of September. Very delayed response but able to with extended time, spoke 3-4 words at time      Exercises Other Exercises Other Exercises: ankle pumps, heel slides, hip abduction, SAQ AAROM x10 ea, much improved muscle activation compared to previous  session    General Comments        Pertinent Vitals/Pain Pain Assessment: Faces Faces Pain Scale: Hurts little more Pain Location: with neck movements, pt endorsed R shoulder pain Pain Descriptors / Indicators: Guarding;Moaning;Grimacing;Aching Pain Intervention(s): Limited activity within patient's tolerance;Monitored during session;Repositioned    Home Living                      Prior Function            PT Goals (current goals can now be found in the care plan section) Progress towards PT goals: Progressing toward goals    Frequency    Min 2X/week      PT Plan Current plan remains appropriate    Co-evaluation               AM-PAC PT "6 Clicks" Mobility   Outcome Measure  Help needed turning from your back to your side while in a flat bed without using bedrails?: A Lot Help needed moving from lying on your back to sitting on the side of a flat bed without using bedrails?: A Lot Help needed moving to and from a bed to a chair (including a wheelchair)?: Total Help needed standing up from a chair using your arms (e.g., wheelchair or bedside chair)?: Total Help needed to walk in hospital room?: Total Help needed climbing 3-5 steps with a railing? : Total 6 Click Score: 8    End of Session   Activity Tolerance: Patient limited by fatigue Patient left: in bed;with call bell/phone within reach;with SCD's reapplied;with bed alarm set Nurse Communication: Mobility status PT Visit Diagnosis: Muscle weakness (generalized) (M62.81);Difficulty in walking, not elsewhere classified (R26.2);Adult, failure to thrive (R62.7)     Time: 4010-2725 PT Time Calculation (min) (ACUTE ONLY): 28 min  Charges:  $Therapeutic Exercise: 23-37 mins                     Olga Coaster PT, DPT 11:58 AM,07/27/20

## 2020-07-27 NOTE — Care Management Important Message (Signed)
Important Message  Patient Details  Name: Joseph Owen MRN: [id number] Date of Birth: Feb 01, 1951   Medicare Important Message Given:  Yes     [name] 07/27/2020, 1:03 PM

## 2020-07-27 NOTE — NC FL2 (Signed)
[place] MEDICAID FL2 LEVEL OF CARE SCREENING TOOL     IDENTIFICATION  Patient Name: Joseph Owen Birthdate: 09-11-1951 Sex: male Admission Date (Current Location): 07/14/2020  [place] and [place] Number:  Chiropodist and Address:  [facility], [address], [place], [place] [address]      Provider Number: [id number]  Attending Physician Name and Address:  Arnetha Courser, MD  Relative Name and Phone Number:  Auston Halfmann (daughter)    Current Level of Care: Hospital Recommended Level of Care: Skilled Nursing Facility Prior Approval Number:    Date Approved/Denied:   PASRR Number: [id number]  Discharge Plan: SNF    Current Diagnoses: Patient Active Problem List   Diagnosis Date Noted  . Pressure injury of skin 07/27/2020  . Major depressive disorder, recurrent episode, moderate (HCC) 07/18/2020  . Altered mental status   . Glasgow coma scale total score 3-8 (HCC)   . AKI (acute kidney injury) (HCC)   . Swelling of arm   . Respiratory failure (HCC) 07/14/2020  . Falls 03/08/2016  . Left foot drop 03/08/2016    Orientation RESPIRATION BLADDER Height & Weight     Self, Time, Situation, Place  Normal Continent Weight: 80.6 kg Height:  6' 0.01" (182.9 cm)  BEHAVIORAL SYMPTOMS/MOOD NEUROLOGICAL BOWEL NUTRITION STATUS      Continent Diet (regular)  AMBULATORY STATUS COMMUNICATION OF NEEDS Skin   Extensive Assist Verbally Normal                       Personal Care Assistance Level of Assistance  Bathing, Dressing Bathing Assistance: Maximum assistance   Dressing Assistance: Maximum assistance     Functional Limitations Info  Sight Sight Info: Impaired (Glasses)        SPECIAL CARE FACTORS FREQUENCY  PT (By licensed PT), OT (By licensed OT)     PT Frequency: 5x weekly OT Frequency: 5x weekly            Contractures Contractures Info: Not present    Additional Factors Info  Code Status, Allergies  Code Status Info: Full Code Allergies Info: NKDA           Current Medications (07/27/2020):  This is the current hospital active medication list Current Facility-Administered Medications  Medication Dose Route Frequency Provider Last Rate Last Admin  . 0.9 %  sodium chloride infusion  250 mL Intravenous PRN Erin Fulling, MD   Paused at 07/22/20 1150  . acetaminophen (TYLENOL) suppository 650 mg  650 mg Rectal Q4H PRN Jimmye Norman, NP      . acetaminophen (TYLENOL) tablet 650 mg  650 mg Oral Q4H PRN Erin Fulling, MD   650 mg at 07/25/20 2349  . Chlorhexidine Gluconate Cloth 2 % PADS 6 each  6 each Topical Daily Judithe Modest, NP   6 each at 07/27/20 1029  . dextrose 5 % solution   Intravenous Continuous Arnetha Courser, MD 100 mL/hr at 07/27/20 0823 New Bag at 07/27/20 9381  . docusate sodium (COLACE) capsule 100 mg  100 mg Oral BID PRN Erin Fulling, MD      . enoxaparin (LOVENOX) injection 40 mg  40 mg Subcutaneous Q24H Rudene Christians B, RPH   40 mg at 07/26/20 2212  . escitalopram (LEXAPRO) tablet 5 mg  5 mg Oral Daily Arnetha Courser, MD   5 mg at 07/27/20 0940  . famotidine (PEPCID) tablet 20 mg  20 mg Oral BID Lurene Shadow, MD  20 mg at 07/27/20 0944  . feeding supplement (ENSURE ENLIVE) (ENSURE ENLIVE) liquid 237 mL  237 mL Oral TID BM Lurene Shadow, MD   237 mL at 07/27/20 1337  . folic acid (FOLVITE) tablet 1 mg  1 mg Oral Daily Arnetha Courser, MD   1 mg at 07/27/20 0943  . gabapentin (NEURONTIN) 300 MG/6ML solution 100 mg  100 mg Oral Q8H Amin, Tilman Neat, MD   100 mg at 07/27/20 1339  . hydrALAZINE (APRESOLINE) injection 10 mg  10 mg Intravenous Q8H PRN Arnetha Courser, MD      . losartan (COZAAR) tablet 25 mg  25 mg Oral Daily Arnetha Courser, MD   25 mg at 07/27/20 0940  . MEDLINE mouth rinse  15 mL Mouth Rinse BID Salena Saner, MD   15 mL at 07/27/20 1057  . mirtazapine (REMERON) tablet 15 mg  15 mg Oral QHS Arnetha Courser, MD   15 mg at 07/26/20 2212  .  multivitamin with minerals tablet 1 tablet  1 tablet Oral Daily Arnetha Courser, MD   1 tablet at 07/27/20 0946  . nicotine (NICODERM CQ - dosed in mg/24 hours) patch 14 mg  14 mg Transdermal Daily Erin Fulling, MD   14 mg at 07/27/20 0936  . ondansetron (ZOFRAN) injection 4 mg  4 mg Intravenous Q6H PRN Erin Fulling, MD      . pantoprazole sodium (PROTONIX) 40 mg/20 mL oral suspension 20 mg  20 mg Oral Daily Arnetha Courser, MD   20 mg at 07/27/20 1015  . polyethylene glycol (MIRALAX / GLYCOLAX) packet 17 g  17 g Oral Daily PRN Erin Fulling, MD      . sodium chloride flush (NS) 0.9 % injection 10-40 mL  10-40 mL Intracatheter Q12H Arnetha Courser, MD   10 mL at 07/27/20 0949  . sodium chloride flush (NS) 0.9 % injection 10-40 mL  10-40 mL Intracatheter PRN Arnetha Courser, MD   10 mL at 07/24/20 1100  . sodium chloride flush (NS) 0.9 % injection 3 mL  3 mL Intravenous Q12H Erin Fulling, MD   3 mL at 07/27/20 0949  . sodium chloride flush (NS) 0.9 % injection 3 mL  3 mL Intravenous PRN Erin Fulling, MD   3 mL at 07/24/20 1100  . thiamine tablet 100 mg  100 mg Oral Daily Lurene Shadow, MD   100 mg at 07/27/20 2725     Discharge Medications: Please see discharge summary for a list of discharge medications.  Relevant Imaging Results:  Relevant Lab Results:   Additional Information #[id number]  Bing Quarry, RN

## 2020-07-27 NOTE — Progress Notes (Signed)
PROGRESS NOTE  PCCM transfer.  Joseph Owen  TZG:[id number] DOB: [birth year] DOA: 07/14/2020 PCP: Jaclyn Shaggy, MD   Brief Narrative: Taken from H&P.  Patient is unable to provide much detail. Joseph Owen is a 69 y.o. male with a past history of chronic pain, depression chronic history of frequent fall and unsteadiness which has been worked up by neurologist in the past, alcohol abuse and GERD(found to have some esophageal stenosis which was dilated in July 2021) presented to ED after being found unresponsive on the bathroom floor with the tub overflowing with water.  Patient lives alone and family last talked with him 24 hours ago.  Unable to obtained significant history.  On arrival he was significantly hypoxic and unresponsive requiring emergent intubation.  Extubated on 07/15/2020 and care was transitioned to [facility] on 07/16/2020.  EKG with sinus tachycardia but was without any significant ST changes.  On arrival labs are positive for leukocytosis with WBC count of 27.7 and neutrophils of 24.9, no obvious source of infection, may be secondary to stress with fall and being unresponsive, multiple electrolyte abnormalities which include hypokalemia, hypomagnesemia and hypophosphatemia.  AKI with creatinine of 1.5.  Baseline around 1.1-1.2.  Lactic acid of 4-never checked again. Urine, blood cultures and CSF cultures negative.  Toxicology only positive for benzos in UDS.  CK elevated at 1100. Troponin mildly elevated, more consistent with demand.  CT head and cervical spine was without any acute abnormality.  Echocardiogram with normal EF and no wall motion abnormalities.  Apparently patient has an history of severe depression and he was admitted at behavioral health with suicidal attempt over the summer.  He was evaluated by psychiatry during current hospitalization and they do not think that he needs inpatient behavioral health and recommending starting him on Lexapro.  Sepsis was noted in ED  note but ruled out as there is no source of infection. All cultures include CSF was negative.  EEG with severe encephalopathy and no seizure-like activity.  Confabulations noted after couple of days when he was little more awake.  Completed the course of high-dose thiamine for concern of Wernicke's/Korsakoff encephalopathy.  Subjective: Patient appears dry and lethargic when seen today.  Level to communicate well, when asked about p.o. hydration, stating that he is keep asking for water and they are not giving him.  Assessment & Plan:   Active Problems:   Respiratory failure (HCC)   Glasgow coma scale total score 3-8 (HCC)   AKI (acute kidney injury) (HCC)   Swelling of arm   Altered mental status   Major depressive disorder, recurrent episode, moderate (HCC)   Pressure injury of skin  Acute hypoxic respiratory failure.  Resolved  Unknown etiology.  Toxicology was pretty much negative except benzos in UDS.  Patient has an history of suicidal attempt secondary to severe depression and was admitted in behavioral health over the summer.  Not sure whether urine was obtained after he received medications for intubation. Patient is extubated now and was saturating well on room air.  Encephalopathy/severe depression. Wernicke's/Korsakoff.  On chart review he has an history of frequent falls and unsteadiness for many years and also being evaluated by a neurologist with no diagnosis and was referred for physical therapy. Neurology was consulted and they signed off as imaging was negative for any acute changes.EEG -shows diffuse encephalopathy. After 4 days he become little more alert and started talking which was consistent with confabulations.-Concern of Wernicke's/Korsakoff because of his history of alcohol abuse.  Patient was already receiving thiamine 100 mg daily since admission.  He was started on high-dose thiamine and completed the course. -Continue to monitor. -Continue thiamine at 100  mg daily. -PT/OT evaluation are recommending SNF placement-TOC is working on placement.  Tube feeding.  Discontinued on 07/20/2020. NG tube was placed after 4 days of hospitalization with continuation of severe encephalopathy and unable to take any p.o. he did receive 2 feet for 2 days.   Swallow evaluation is recommending dysphagia diet.  Patient is able to eat but still requires some assistance.  Sepsis ruled out as there is no obvious source of infection and leukocytosis resolved.  Elevated troponin.  EKG without any acute changes.  Barely positive troponin most likely secondary to demand with a flat curve.  Electrolyte abnormalities.  Mild hyponatremia today, patient appears dry. -Give him 1 L of D5 at 100 mL/h for 10 hours. -Encourage p.o. hydration. -Replete as needed and monitor.  Right upper extremity edema.  Venous Doppler obtained yesterday for the concern of right upper extremity edema which was negative for DVT.  AKI with CKD stage IIIa.  Resolved with IV hydration.  Creatinine below 1 now. -Continue to monitor. -Avoid nephrotoxins.  Depression.  His current hospitalization might be secondary to exacerbation of his severe depression. -Psych was consulted -recommending starting Lexapro and there is no need for inpatient behavioral health. -Continue home dose of Remeron-restarted today.  Objective: Vitals:   07/27/20 0434 07/27/20 0733 07/27/20 1200 07/27/20 1604  BP: (!) 101/54 98/66 (!) 107/92 140/70  Pulse: 89 87 78 72  Resp: 18 18 17 17   Temp: 99.3 F (37.4 C) 97.8 F (36.6 C) 97.9 F (36.6 C) 97.8 F (36.6 C)  TempSrc: Oral Oral Oral Oral  SpO2: 96% 91% 99% 91%  Weight: 80.6 kg     Height:        Intake/Output Summary (Last 24 hours) at 07/27/2020 1704 Last data filed at 07/27/2020 1604 Gross per 24 hour  Intake 2153.85 ml  Output 1150 ml  Net 1003.85 ml   Filed Weights   07/25/20 0300 07/26/20 0312 07/27/20 0434  Weight: 79.6 kg 80.3 kg 80.6 kg     Examination:  General.  Well-developed gentleman,in no acute distress. Pulmonary.  Lungs clear bilaterally, normal respiratory effort. CV.  Regular rate and rhythm, no JVD, rub or murmur. Abdomen.  Soft, nontender, nondistended, BS positive. CNS.  Alert and oriented x3.  No focal neurologic deficit. Extremities.  No edema, no cyanosis, pulses intact and symmetrical. Psychiatry.  Judgment and insight appears normal.  DVT prophylaxis: Lovenox Code Status: Full Family Communication: No family at bedside. Disposition Plan:  Status is: Inpatient  Remains inpatient appropriate because:Inpatient level of care appropriate due to severity of illness   Dispo: The patient is from: Home              Anticipated d/c is to: SNF              Anticipated d/c date is: 1-2 days.              Patient currently is medically stable for discharge.  TOC is working on placement.  Daughter wants to have him close to [place].  Consultants:   PCCM  Neurology  Psychiatry.  Procedures:  Antimicrobials:   Data Reviewed: I have personally reviewed following labs and imaging studies  CBC: Recent Labs  Lab 07/21/20 0351 07/23/20 0515 07/27/20 0444  WBC 11.4* 10.7* 11.1*  NEUTROABS  --  8.1*  --  HGB 10.2* 10.2* 9.7*  HCT 31.0* 30.5* 29.9*  MCV 100.6* 99.3 102.0*  PLT 165 158 233   Basic Metabolic Panel: Recent Labs  Lab 07/21/20 0351 07/21/20 0351 07/22/20 0459 07/22/20 0459 07/23/20 0515 07/24/20 0647 07/25/20 0522 07/26/20 0454 07/27/20 0444  NA 145   < > 147*   < > 149* 148* 144 146* 146*  K 3.4*   < > 3.7   < > 3.4* 3.4* 4.3 4.1 4.3  CL 109   < > 108   < > 109 110 109 108 107  CO2 28   < > 29   < > 30 32 32 33* 33*  GLUCOSE 98   < > 105*   < > 105* 119* 122* 102* 103*  BUN 16   < > 18   < > 20 21 18 22  26*  CREATININE 1.00   < > 1.01   < > 1.01 0.93 0.93 0.84 0.97  CALCIUM 8.7*   < > 8.7*   < > 8.4* 8.7* 8.7* 8.5* 8.7*  MG 1.6*   < > 1.8  --  1.8 2.1 1.9 1.7  --    PHOS 3.3  --  3.2  --  3.0 3.2 2.8  --   --    < > = values in this interval not displayed.   GFR: Estimated Creatinine Clearance: 77.8 mL/min (by C-G formula based on SCr of 0.97 mg/dL). Liver Function Tests: No results for input(s): AST, ALT, ALKPHOS, BILITOT, PROT, ALBUMIN in the last 168 hours. No results for input(s): LIPASE, AMYLASE in the last 168 hours. No results for input(s): AMMONIA in the last 168 hours. Coagulation Profile: No results for input(s): INR, PROTIME in the last 168 hours. Cardiac Enzymes: No results for input(s): CKTOTAL, CKMB, CKMBINDEX, TROPONINI in the last 168 hours. BNP (last 3 results) No results for input(s): PROBNP in the last 8760 hours. HbA1C: No results for input(s): HGBA1C in the last 72 hours. CBG: Recent Labs  Lab 07/27/20 0003 07/27/20 0413 07/27/20 0733 07/27/20 1200 07/27/20 1616  GLUCAP 96 98 105* 137* 106*   Lipid Profile: No results for input(s): CHOL, HDL, LDLCALC, TRIG, CHOLHDL, LDLDIRECT in the last 72 hours. Thyroid Function Tests: No results for input(s): TSH, T4TOTAL, FREET4, T3FREE, THYROIDAB in the last 72 hours. Anemia Panel: Recent Labs    07/27/20 0444  VITAMINB12 428   Sepsis Labs: Recent Labs  Lab 07/21/20 0351  PROCALCITON <0.10    No results found for this or any previous visit (from the past 240 hour(s)).   Radiology Studies: No results found.  Scheduled Meds: . Chlorhexidine Gluconate Cloth  6 each Topical Daily  . enoxaparin (LOVENOX) injection  40 mg Subcutaneous Q24H  . escitalopram  5 mg Oral Daily  . famotidine  20 mg Oral BID  . feeding supplement (ENSURE ENLIVE)  237 mL Oral TID BM  . folic acid  1 mg Oral Daily  . gabapentin  100 mg Oral Q8H  . losartan  25 mg Oral Daily  . mouth rinse  15 mL Mouth Rinse BID  . mirtazapine  15 mg Oral QHS  . multivitamin with minerals  1 tablet Oral Daily  . nicotine  14 mg Transdermal Daily  . pantoprazole sodium  20 mg Oral Daily  . sodium chloride  flush  10-40 mL Intracatheter Q12H  . sodium chloride flush  3 mL Intravenous Q12H  . thiamine  100 mg Oral Daily   Continuous Infusions: .  sodium chloride Stopped (07/22/20 1150)  . dextrose 100 mL/hr at 07/27/20 0823     LOS: 13 days   Time spent: 20 minutes.  Arnetha Courser, MD [facility]  If 7PM-7AM, please contact night-coverage [email or web address]  07/27/2020, 5:04 PM   This record has been created using Conservation officer, historic buildings. Errors have been sought and corrected,but may not always be located. Such creation errors do not reflect on the standard of care.

## 2020-07-27 NOTE — TOC Progression Note (Signed)
Transition of Care [facility]) - Progression Note    Patient Details  Name: Joseph Owen MRN: [id number] Date of Birth: 1950/10/31  Transition of Care [facility]) CM/SW Contact  Shawn Route, RN Phone Number: 07/27/2020, 9:26 AM  Clinical Narrative:     Patient is able to answer some questions, but has referred [place] to his family.  I have left a message with Jolyn Nap, listed as his daughter to pleaser return my call for initial assessment and to discuss short term rehab.         Expected Discharge Plan and Services                                                 Social Determinants of Health (SDOH) Interventions    Readmission Risk Interventions No flowsheet data found.

## 2020-07-28 DIAGNOSIS — N179 Acute kidney failure, unspecified: Secondary | ICD-10-CM | POA: Diagnosis not present

## 2020-07-28 DIAGNOSIS — J9601 Acute respiratory failure with hypoxia: Secondary | ICD-10-CM | POA: Diagnosis not present

## 2020-07-28 LAB — URINALYSIS, COMPLETE (UACMP) WITH MICROSCOPIC
Bilirubin Urine: NEGATIVE
Glucose, UA: NEGATIVE mg/dL
Hgb urine dipstick: NEGATIVE
Ketones, ur: NEGATIVE mg/dL
Leukocytes,Ua: NEGATIVE
Nitrite: POSITIVE — AB
Protein, ur: NEGATIVE mg/dL
Specific Gravity, Urine: 1.016 (ref 1.005–1.030)
Squamous Epithelial / HPF: NONE SEEN (ref 0–5)
pH: 7 (ref 5.0–8.0)

## 2020-07-28 LAB — BASIC METABOLIC PANEL
Anion gap: 9 (ref 5–15)
BUN: 20 mg/dL (ref 8–23)
CO2: 30 mmol/L (ref 22–32)
Calcium: 8.8 mg/dL — ABNORMAL LOW (ref 8.9–10.3)
Chloride: 101 mmol/L (ref 98–111)
Creatinine, Ser: 0.85 mg/dL (ref 0.61–1.24)
GFR calc non Af Amer: >60 mL/min (ref >60)
Glucose, Bld: 93 mg/dL (ref 70–99)
Potassium: 4.2 mmol/L (ref 3.5–5.1)
Sodium: 140 mmol/L (ref 135–145)

## 2020-07-28 LAB — GLUCOSE, CAPILLARY
Glucose-Capillary: 127 mg/dL — ABNORMAL HIGH (ref 70–99)
Glucose-Capillary: 122 mg/dL — ABNORMAL HIGH (ref 70–99)
Glucose-Capillary: 96 mg/dL (ref 70–99)
Glucose-Capillary: 76 mg/dL (ref 70–99)
Glucose-Capillary: 109 mg/dL — ABNORMAL HIGH (ref 70–99)

## 2020-07-28 MED ORDER — SODIUM CHLORIDE 0.9 % IV SOLN
1.0000 g | INTRAVENOUS | Status: DC
  Administered 2020-07-28 – 2020-07-29 (×2): 1 g via INTRAVENOUS
  Filled 2020-07-28: qty 1
  Filled 2020-07-28 (×4): qty 10

## 2020-07-28 MED ORDER — DEXTROSE 5 % IV SOLN: INTRAVENOUS | Status: DC

## 2020-07-28 MED ORDER — NEPRO/CARBSTEADY PO LIQD: 237.0000 mL | Freq: Two times a day (BID) | ORAL | Status: DC

## 2020-07-28 MED ORDER — DEXTROSE IN LACTATED RINGERS 5 % IV SOLN: INTRAVENOUS | Status: AC

## 2020-07-28 NOTE — Progress Notes (Signed)
Nutrition Follow Up Note   DOCUMENTATION CODES:   Not applicable  INTERVENTION:   Nepro Shake po BID, each supplement provides 425 kcal and 19 grams protein  Hormel Shake po TID with meals, each supplement provides 520 kcal and 22 grams of protein.  Magic cup TID with meals, each supplement provides 290 kcal and 9 grams of protein.  MVI, thiamine and folic acid daily in setting of etoh abuse   Assist with meals   Pt at high refeed risk; recommend monitor potassium, magnesium and phosphorus labs daily until stable  NUTRITION DIAGNOSIS:   Inadequate oral intake related to lethargy/confusion as evidenced by meal completion < 50%.  GOAL:   Patient will meet greater than or equal to 90% of their needs -progressing   MONITOR:   PO intake, Supplement acceptance, Diet advancement, Labs, Weight trends, I & O's  ASSESSMENT:   69 year old male with PMHx of chronic pain, depression, frequent falls, GERD, esophageal stenosis s/p dilation 04/2020 admitted with decreased responsiveness/encephalopathy, possible Wernicke's/Korsakoff, AKI on CKD stage III.   Met with pt in room today. Pt is much more alert today and is able to have a conversation. Pt reports eating "pretty well" at lunch today. Pt's meal tray was sitting on his side table which was ~95% eaten. When asked why patient did not drink his Vital Cuisine supplement or eat his Magic Cup, pt reports that he was unable to get them open. RD opened the Vital Cuisine and pt drank it while RD was still in the room. Pt reports that he is willing to drink supplements in hospital as he enjoys them. Pt had spilled pudding all down the front of his gown and was asking to be changed. RD will place an order for pt to be assisted with meals. Per MD note, pt did not eat any of his breakfast. RD will discontinue Ensure and add Nepro as this is nectar thick. Continue supplements on meal trays. Pt is at refeed risk. Per chart, pt is down 6lbs(3%) since  admit; this is not significant. No BM noted since 9/28; MD notified. Plan is for SNF at discharge.   Medications reviewed and include: lovenox, pepcid, folic acid, remeron, MVI, nicotine, protonix, thiamine, ceftriaxone  Labs reviewed: K 4.2 wnl P 2.8 wnl- 10/2 Mg 1.7 wnl- 10/3 Wbc- 11.1(H), Hgb 9.7(L), Hct 29.9(L)  Diet Order:   Diet Order            DIET - DYS 1 Room service appropriate? No; Fluid consistency: Nectar Thick  Diet effective now                EDUCATION NEEDS:   No education needs have been identified at this time  Skin:  Skin Assessment: Reviewed RN Assessment  Last BM:  9/28  Height:   Ht Readings from Last 1 Encounters:  07/15/20 6' 0.01" (1.829 m)   Weight:   Wt Readings from Last 1 Encounters:  07/28/20 78.9 kg   Ideal Body Weight:  80.9 kg  BMI:  Body mass index is 23.58 kg/m.  Estimated Nutritional Needs:   Kcal:  2100-2300  Protein:  105-115 grams  Fluid:  2.1-2.3 L/day  Koleen Distance MS, RD, LDN Please refer to [facility] for RD and/or RD on-call/weekend/after hours pager

## 2020-07-28 NOTE — Progress Notes (Signed)
PROGRESS NOTE  PCCM transfer.  Joseph Owen  YSA:[id number] DOB: Mar 20, 1951 DOA: 07/14/2020 PCP: Jaclyn Shaggy, MD   Brief Narrative: Taken from H&P.  Patient is unable to provide much detail. Joseph Owen is a 69 y.o. male with a past history of chronic pain, depression chronic history of frequent fall and unsteadiness which has been worked up by neurologist in the past, alcohol abuse and GERD(found to have some esophageal stenosis which was dilated in July 2021) presented to ED after being found unresponsive on the bathroom floor with the tub overflowing with water.  Patient lives alone and family last talked with him 24 hours ago.  Unable to obtained significant history.  On arrival he was significantly hypoxic and unresponsive requiring emergent intubation.  Extubated on 07/15/2020 and care was transitioned to [facility] on 07/16/2020.  EKG with sinus tachycardia but was without any significant ST changes.  On arrival labs are positive for leukocytosis with WBC count of 27.7 and neutrophils of 24.9, no obvious source of infection, may be secondary to stress with fall and being unresponsive, multiple electrolyte abnormalities which include hypokalemia, hypomagnesemia and hypophosphatemia.  AKI with creatinine of 1.5.  Baseline around 1.1-1.2.  Lactic acid of 4-never checked again. Urine, blood cultures and CSF cultures negative.  Toxicology only positive for benzos in UDS.  CK elevated at 1100. Troponin mildly elevated, more consistent with demand.  CT head and cervical spine was without any acute abnormality.  Echocardiogram with normal EF and no wall motion abnormalities.  Apparently patient has an history of severe depression and he was admitted at behavioral health with suicidal attempt over the summer.  He was evaluated by psychiatry during current hospitalization and they do not think that he needs inpatient behavioral health and recommending starting him on Lexapro.  Sepsis was noted in ED  note but ruled out as there is no source of infection. All cultures include CSF was negative.  EEG with severe encephalopathy and no seizure-like activity.  Confabulations noted after couple of days when he was little more awake.  Completed the course of high-dose thiamine for concern of Wernicke's/Korsakoff encephalopathy.  Subjective: Patient appears more lethargic and just mumbling when asked different questions.  Per nursing staff he did not ate his breakfast either.  Assessment & Plan:   Active Problems:   Respiratory failure (HCC)   Glasgow coma scale total score 3-8 (HCC)   AKI (acute kidney injury) (HCC)   Swelling of arm   Altered mental status   Major depressive disorder, recurrent episode, moderate (HCC)   Pressure injury of skin  Acute hypoxic respiratory failure.  Resolved  Unknown etiology.  Toxicology was pretty much negative except benzos in UDS.  Patient has an history of suicidal attempt secondary to severe depression and was admitted in behavioral health over the summer.  Not sure whether urine was obtained after he received medications for intubation. Patient is extubated now and was saturating well on room air.  Encephalopathy/severe depression. Wernicke's/Korsakoff.  On chart review he has an history of frequent falls and unsteadiness for many years and also being evaluated by a neurologist with no diagnosis and was referred for physical therapy. Neurology was consulted and they signed off as imaging was negative for any acute changes.EEG -shows diffuse encephalopathy. After 4 days he become little more alert and started talking which was consistent with confabulations.-Concern of Wernicke's/Korsakoff because of his history of alcohol abuse.  Patient was already receiving thiamine 100 mg daily since admission.  He was started on high-dose thiamine and completed the course. 10/5.  Appears more lethargic, BNP within normal limits.  UA shows nitrites and few bacteria,  no leukocytes, urine cultures ordered and he will be started on ceftriaxone as he is also having some leukocytosis and little worsening of his encephalopathy. -Continue to monitor. -Continue thiamine at 100 mg daily. -PT/OT evaluation are recommending SNF placement-had 1 bed offer for today but unable to discharge due to worsening of his encephalopathy.  UTI.  Patient did had mild leukocytosis, UA done which shows positive nitrites and few bacteria, no leukocytosis.  Due to worsening of his encephalopathy urine cultures were ordered and he will be started on ceftriaxone.  Tube feeding.  Discontinued on 07/20/2020. NG tube was placed after 4 days of hospitalization with continuation of severe encephalopathy and unable to take any p.o. he did receive 2 feet for 2 days.   Swallow evaluation is recommending dysphagia diet.  Patient is able to eat but still requires some assistance.  Sepsis ruled out as there is no obvious source of infection and leukocytosis resolved.  Elevated troponin.  EKG without any acute changes.  Barely positive troponin most likely secondary to demand with a flat curve.  Electrolyte abnormalities.  Resolved. -Encourage p.o. hydration. -Replete as needed and monitor.  Right upper extremity edema.  Venous Doppler obtained yesterday for the concern of right upper extremity edema which was negative for DVT.  AKI with CKD stage IIIa.  Resolved with IV hydration.  Creatinine below 1 now. -Continue to monitor. -Avoid nephrotoxins.  Depression.  -Psych was consulted -recommending starting Lexapro and there is no need for inpatient behavioral health. -Continue home dose of Remeron and Lexapro.  Objective: Vitals:   07/27/20 1936 07/28/20 0327 07/28/20 0806 07/28/20 1136  BP: (!) 139/95 118/67 128/70 126/85  Pulse: 70 79 80 76  Resp: 20 17 18 16   Temp: 98.6 F (37 C) 99.4 F (37.4 C) 98.6 F (37 C) 98.9 F (37.2 C)  TempSrc: Oral Oral Oral Oral  SpO2: 97% 95% 96%  98%  Weight:  78.9 kg    Height:        Intake/Output Summary (Last 24 hours) at 07/28/2020 1324 Last data filed at 07/28/2020 1147 Gross per 24 hour  Intake 1020.85 ml  Output 1625 ml  Net -604.15 ml   Filed Weights   07/26/20 0312 07/27/20 0434 07/28/20 0327  Weight: 80.3 kg 80.6 kg 78.9 kg    Examination:  General.  Lethargic gentleman, in no acute distress. Pulmonary.  Lungs clear bilaterally, normal respiratory effort. CV.  Regular rate and rhythm, no JVD, rub or murmur. Abdomen.  Soft, nontender, nondistended, BS positive. CNS.  Appears more lethargic, just mumbling when trying to answer questions. Extremities.  No edema, no cyanosis, pulses intact and symmetrical. Psychiatry.  Judgment and insight appears normal.  DVT prophylaxis: Lovenox Code Status: Full Family Communication: No family at bedside. Disposition Plan:  Status is: Inpatient  Remains inpatient appropriate because:Inpatient level of care appropriate due to severity of illness   Dispo: The patient is from: Home              Anticipated d/c is to: SNF              Anticipated d/c date is: 1-2 days.              Patient currently is medically stable for discharge. Daughter wants to have him close to [place].  Had 1 bed offer  for today but unable to discharge due to worsening encephalopathy.  Consultants:   PCCM  Neurology  Psychiatry.  Procedures:  Antimicrobials:  Ceftriaxone  Data Reviewed: I have personally reviewed following labs and imaging studies  CBC: Recent Labs  Lab 07/23/20 0515 07/27/20 0444  WBC 10.7* 11.1*  NEUTROABS 8.1*  --   HGB 10.2* 9.7*  HCT 30.5* 29.9*  MCV 99.3 102.0*  PLT 158 233   Basic Metabolic Panel: Recent Labs  Lab 07/22/20 0459 07/22/20 0459 07/23/20 0515 07/23/20 0515 07/24/20 0647 07/25/20 0522 07/26/20 0454 07/27/20 0444 07/28/20 1056  NA 147*   < > 149*   < > 148* 144 146* 146* 140  K 3.7   < > 3.4*   < > 3.4* 4.3 4.1 4.3 4.2  CL 108    < > 109   < > 110 109 108 107 101  CO2 29   < > 30   < > 32 32 33* 33* 30  GLUCOSE 105*   < > 105*   < > 119* 122* 102* 103* 93  BUN 18   < > 20   < > 21 18 22  26* 20  CREATININE 1.01   < > 1.01   < > 0.93 0.93 0.84 0.97 0.85  CALCIUM 8.7*   < > 8.4*   < > 8.7* 8.7* 8.5* 8.7* 8.8*  MG 1.8  --  1.8  --  2.1 1.9 1.7  --   --   PHOS 3.2  --  3.0  --  3.2 2.8  --   --   --    < > = values in this interval not displayed.   GFR: Estimated Creatinine Clearance: 88.8 mL/min (by C-G formula based on SCr of 0.85 mg/dL). Liver Function Tests: No results for input(s): AST, ALT, ALKPHOS, BILITOT, PROT, ALBUMIN in the last 168 hours. No results for input(s): LIPASE, AMYLASE in the last 168 hours. No results for input(s): AMMONIA in the last 168 hours. Coagulation Profile: No results for input(s): INR, PROTIME in the last 168 hours. Cardiac Enzymes: No results for input(s): CKTOTAL, CKMB, CKMBINDEX, TROPONINI in the last 168 hours. BNP (last 3 results) No results for input(s): PROBNP in the last 8760 hours. HbA1C: No results for input(s): HGBA1C in the last 72 hours. CBG: Recent Labs  Lab 07/27/20 2035 07/28/20 0107 07/28/20 0324 07/28/20 0812 07/28/20 1136  GLUCAP 94 127* 122* 96 76   Lipid Profile: No results for input(s): CHOL, HDL, LDLCALC, TRIG, CHOLHDL, LDLDIRECT in the last 72 hours. Thyroid Function Tests: No results for input(s): TSH, T4TOTAL, FREET4, T3FREE, THYROIDAB in the last 72 hours. Anemia Panel: Recent Labs    07/27/20 0444  VITAMINB12 428   Sepsis Labs: No results for input(s): PROCALCITON, LATICACIDVEN in the last 168 hours.  No results found for this or any previous visit (from the past 240 hour(s)).   Radiology Studies: No results found.  Scheduled Meds: . Chlorhexidine Gluconate Cloth  6 each Topical Daily  . enoxaparin (LOVENOX) injection  40 mg Subcutaneous Q24H  . escitalopram  5 mg Oral Daily  . famotidine  20 mg Oral BID  . feeding supplement  (ENSURE ENLIVE)  237 mL Oral TID BM  . folic acid  1 mg Oral Daily  . gabapentin  100 mg Oral Q8H  . losartan  25 mg Oral Daily  . mouth rinse  15 mL Mouth Rinse BID  . mirtazapine  15 mg Oral QHS  .  multivitamin with minerals  1 tablet Oral Daily  . nicotine  14 mg Transdermal Daily  . pantoprazole sodium  20 mg Oral Daily  . sodium chloride flush  10-40 mL Intracatheter Q12H  . sodium chloride flush  3 mL Intravenous Q12H  . thiamine  100 mg Oral Daily   Continuous Infusions: . sodium chloride Stopped (07/22/20 1150)     LOS: 14 days   Time spent: 30 minutes.  Arnetha Courser, MD [facility]  If 7PM-7AM, please contact night-coverage [email or web address]  07/28/2020, 1:24 PM   This record has been created using Conservation officer, historic buildings. Errors have been sought and corrected,but may not always be located. Such creation errors do not reflect on the standard of care.

## 2020-07-28 NOTE — Progress Notes (Signed)
Pt transported to 1A, room 159, report given to Jun. Daughter notified of transfer.

## 2020-07-28 NOTE — Progress Notes (Signed)
3 staples removed from left eye, above brow, per order. pt tolerated well. Scant amount of bleeding. Will continue to monitor.

## 2020-07-29 ENCOUNTER — Inpatient Hospital Stay: Payer: Medicare Other

## 2020-07-29 DIAGNOSIS — J9601 Acute respiratory failure with hypoxia: Secondary | ICD-10-CM | POA: Diagnosis not present

## 2020-07-29 DIAGNOSIS — N179 Acute kidney failure, unspecified: Secondary | ICD-10-CM | POA: Diagnosis not present

## 2020-07-29 LAB — CBC
HCT: 29.1 % — ABNORMAL LOW (ref 39.0–52.0)
Hemoglobin: 9.9 g/dL — ABNORMAL LOW (ref 13.0–17.0)
MCH: 33 pg (ref 26.0–34.0)
MCHC: 34 g/dL (ref 30.0–36.0)
MCV: 97 fL (ref 80.0–100.0)
Platelets: 251 10*3/uL (ref 150–400)
RBC: 3 MIL/uL — ABNORMAL LOW (ref 4.22–5.81)
RDW: 13 % (ref 11.5–15.5)
WBC: 12.1 10*3/uL — ABNORMAL HIGH (ref 4.0–10.5)
nRBC: 0 % (ref 0.0–0.2)

## 2020-07-29 LAB — BASIC METABOLIC PANEL
Anion gap: 6 (ref 5–15)
BUN: 19 mg/dL (ref 8–23)
CO2: 32 mmol/L (ref 22–32)
Calcium: 8.8 mg/dL — ABNORMAL LOW (ref 8.9–10.3)
Chloride: 101 mmol/L (ref 98–111)
Creatinine, Ser: 0.82 mg/dL (ref 0.61–1.24)
GFR calc non Af Amer: >60 mL/min (ref >60)
Glucose, Bld: 107 mg/dL — ABNORMAL HIGH (ref 70–99)
Potassium: 3.6 mmol/L (ref 3.5–5.1)
Sodium: 139 mmol/L (ref 135–145)

## 2020-07-29 LAB — GLUCOSE, CAPILLARY
Glucose-Capillary: 104 mg/dL — ABNORMAL HIGH (ref 70–99)
Glucose-Capillary: 115 mg/dL — ABNORMAL HIGH (ref 70–99)
Glucose-Capillary: 117 mg/dL — ABNORMAL HIGH (ref 70–99)
Glucose-Capillary: 124 mg/dL — ABNORMAL HIGH (ref 70–99)
Glucose-Capillary: 132 mg/dL — ABNORMAL HIGH (ref 70–99)

## 2020-07-29 LAB — PROCALCITONIN: Procalcitonin: <0.1 ng/mL

## 2020-07-29 LAB — MAGNESIUM: Magnesium: 1.7 mg/dL (ref 1.7–2.4)

## 2020-07-29 LAB — PHOSPHORUS: Phosphorus: 2.5 mg/dL (ref 2.5–4.6)

## 2020-07-29 LAB — SARS CORONAVIRUS 2 BY RT PCR (HOSPITAL ORDER, PERFORMED IN ~~LOC~~ HOSPITAL LAB): SARS Coronavirus 2: NEGATIVE

## 2020-07-29 MED ORDER — POLYETHYLENE GLYCOL 3350 17 G PO PACK: 17.0000 g | PACK | Freq: Every day | ORAL | Status: DC

## 2020-07-29 NOTE — TOC Progression Note (Signed)
Transition of Care [facility]) - Progression Note    Patient Details  Name: Joseph Owen MRN: [id number] Date of Birth: 11-19-1950  Transition of Care [facility]) CM/SW Contact  Trenton Founds, RN Phone Number: 07/29/2020, 3:38 PM  Clinical Narrative:   RNCM received insurance authorization through [employer] with a start date of 10/6 and next review date of 10/8. Navi reference # is [id number] and Auth ID # is [id number]. Send updated clinicals for review to [phone].     Expected Discharge Plan: Skilled Nursing Facility Barriers to Discharge: Continued Medical Work up  Expected Discharge Plan and Services Expected Discharge Plan: Skilled Nursing Facility In-house Referral: Clinical Social Work Discharge Planning Services: CM Consult   Living arrangements for the past 2 months: Holiday representative                                       Social Determinants of Health (SDOH) Interventions    Readmission Risk Interventions No flowsheet data found.

## 2020-07-29 NOTE — Progress Notes (Signed)
Physical Therapy Treatment Patient Details Name: Joseph Owen MRN: [id number] DOB: December 18, 1950 Today's Date: 07/29/2020    History of Present Illness Pt is a 69 y/o M with onset of syncopal episode at home was found by EMS, was hypothermic, hypoxic, in AKI and in hypercapnic resp failure.  Intubated, extubated, found to have encephalopathy, cleared for seizures, had tachycardia and had benzodiazepine in his system.  At home was falling frequently, has been depressed and had summer incident of [facility] admit for suicide attempt.  PMHx:  foot drop, chronic pain, GERD, CKD 3    PT Comments    Pt was supine in bed holding heart monitor attempting to make a phone call. He is oriented to self only. He is cooperative however confused.  Inconsistently able to follow 1 step commands. Did agree to trial OOB activity however only able to sit EOB x several minutes prior to impulsively attempting to return to bed 2/2 to fatigue. Pt will benefit from continued skilled PT at DC to address deficits, safety, and overall improve independence. Recommend DC to SNF once medically stable.     Follow Up Recommendations  SNF     Equipment Recommendations  Other (comment) (defer to next level of care)    Recommendations for Other Services       Precautions / Restrictions Precautions Precautions: Fall Restrictions Weight Bearing Restrictions: No    Mobility  Bed Mobility Overal bed mobility: Needs Assistance Bed Mobility: Supine to Sit;Sit to Supine     Supine to sit: Mod assist;HOB elevated Sit to supine: Mod assist   General bed mobility comments: Pt required mod assist + increased time to sit on L side of EOB. Constant assistance and vcs/tacle cues fopr sequencing and progression. pt able to maintain balance with close supervision and occasional CGA to prevent LOB. poor cervical spine AROM  Transfers      General transfer comment: pt impulsively lays back into bed and states I'm to tired to try  anything else      Balance Overall balance assessment: Needs assistance Sitting-balance support: Bilateral upper extremity supported;Feet supported Sitting balance-Leahy Scale: Fair Sitting balance - Comments: pt required close supervision and occasional CGA for tac cues for maintaining balance even in sitting         Cognition Arousal/Alertness: Awake/alert Behavior During Therapy: Flat affect Overall Cognitive Status: Impaired/Different from baseline Area of Impairment: Orientation;Attention;Following commands;Safety/judgement;Awareness;Problem solving      Orientation Level: Place;Time;Situation;Disoriented to Current Attention Level: Divided;Selective;Alternating   Following Commands: Follows one step commands inconsistently Safety/Judgement: Decreased awareness of safety;Decreased awareness of deficits Awareness: Intellectual Problem Solving: Slow processing;Difficulty sequencing;Requires verbal cues;Requires tactile cues;Decreased initiation General Comments: Pt was awake throughout sessiion and converstational however disoriented and confused. Pt hold heart monitor to attempt to make phone call. Inconsuistently follows commands. Sits up on EOB but impulsively lays back in bed after only sitting EOB x ~ 5 minutes             Pertinent Vitals/Pain Pain Assessment: Faces Faces Pain Scale: Hurts a little bit Pain Location: R hip and cervical spine pain with movements Pain Descriptors / Indicators: Guarding;Moaning;Grimacing;Aching Pain Intervention(s): Limited activity within patient's tolerance;Monitored during session;Repositioned           PT Goals (current goals can now be found in the care plan section) Acute Rehab PT Goals Patient Stated Goal: none stated Progress towards PT goals: Progressing toward goals    Frequency    Min 2X/week  PT Plan Current plan remains appropriate    Co-evaluation              AM-PAC PT "6 Clicks" Mobility    Outcome Measure  Help needed turning from your back to your side while in a flat bed without using bedrails?: A Lot Help needed moving from lying on your back to sitting on the side of a flat bed without using bedrails?: A Lot Help needed moving to and from a bed to a chair (including a wheelchair)?: Total Help needed standing up from a chair using your arms (e.g., wheelchair or bedside chair)?: Total Help needed to walk in hospital room?: Total Help needed climbing 3-5 steps with a railing? : Total 6 Click Score: 8    End of Session   Activity Tolerance: Patient limited by fatigue;Other (comment) (limited by cognition deficits) Patient left: in bed;with call bell/phone within reach;with SCD's reapplied;with bed alarm set Nurse Communication: Mobility status PT Visit Diagnosis: Muscle weakness (generalized) (M62.81);Difficulty in walking, not elsewhere classified (R26.2);Adult, failure to thrive (R62.7)     Time: 6063-0160 PT Time Calculation (min) (ACUTE ONLY): 15 min  Charges:  $Therapeutic Activity: 8-22 mins                     Jetta Lout PTA 07/29/20, 1:33 PM

## 2020-07-29 NOTE — Progress Notes (Signed)
PROGRESS NOTE  PCCM transfer.  Joseph Owen  EAV:[id number] DOB: [birth year] DOA: 07/14/2020 PCP: Jaclyn Shaggyate, Denny C, MD   Brief Narrative: Taken from H&P.  Patient is unable to provide much detail. Joseph Owen is a 69 y.o. male with a past history of chronic pain, depression chronic history of frequent fall and unsteadiness which has been worked up by neurologist in the past, alcohol abuse and GERD(found to have some esophageal stenosis which was dilated in July 2021) presented to ED after being found unresponsive on the bathroom floor with the tub overflowing with water.  Patient lives alone and family last talked with him 24 hours ago.  Unable to obtained significant history.  On arrival he was significantly hypoxic and unresponsive requiring emergent intubation.  Extubated on 07/15/2020 and care was transitioned to [facility] on 07/16/2020.  EKG with sinus tachycardia but was without any significant ST changes.  On arrival labs are positive for leukocytosis with WBC count of 27.7 and neutrophils of 24.9, no obvious source of infection, may be secondary to stress with fall and being unresponsive, multiple electrolyte abnormalities which include hypokalemia, hypomagnesemia and hypophosphatemia.  AKI with creatinine of 1.5.  Baseline around 1.1-1.2.  Lactic acid of 4-never checked again. Urine, blood cultures and CSF cultures negative.  Toxicology only positive for benzos in UDS.  CK elevated at 1100. Troponin mildly elevated, more consistent with demand.  CT head and cervical spine was without any acute abnormality.  Echocardiogram with normal EF and no wall motion abnormalities.  Apparently patient has an history of severe depression and he was admitted at behavioral health with suicidal attempt over the summer.  He was evaluated by psychiatry during current hospitalization and they do not think that he needs inpatient behavioral health and recommending starting him on Lexapro.  Sepsis was noted in ED  note but ruled out as there is no source of infection. All cultures include CSF was negative.  EEG with severe encephalopathy and no seizure-like activity.  Confabulations noted after couple of days when he was little more awake.  Completed the course of high-dose thiamine for concern of Wernicke's/Korsakoff encephalopathy.  Subjective: Patient was little more alert when seen today.  He was complaining of lower abdominal pain.  Does not remember when he had his bowel movement.  Assessment & Plan:   Active Problems:   Respiratory failure (HCC)   Glasgow coma scale total score 3-8 (HCC)   AKI (acute kidney injury) (HCC)   Swelling of arm   Altered mental status   Major depressive disorder, recurrent episode, moderate (HCC)   Pressure injury of skin  Acute hypoxic respiratory failure.  Resolved  Unknown etiology.  Toxicology was pretty much negative except benzos in UDS.  Patient has an history of suicidal attempt secondary to severe depression and was admitted in behavioral health over the summer.  Not sure whether urine was obtained after he received medications for intubation. Patient is extubated now and was saturating well on room air.  Encephalopathy/severe depression. Wernicke's/Korsakoff.  On chart review he has an history of frequent falls and unsteadiness for many years and also being evaluated by a neurologist with no diagnosis and was referred for physical therapy. Neurology was consulted and they signed off as imaging was negative for any acute changes.EEG -shows diffuse encephalopathy. After 4 days he become little more alert and started talking which was consistent with confabulations.-Concern of Wernicke's/Korsakoff because of his history of alcohol abuse.  Patient was already receiving thiamine 100  mg daily since admission.  He was started on high-dose thiamine and completed the course. 10/5.  Appears more lethargic, BNP within normal limits.  UA shows nitrites and few  bacteria, no leukocytes, urine cultures ordered and he will be started on ceftriaxone as he is also having some leukocytosis and little worsening of his encephalopathy. 10/6.  Appears more alert. -Continue to monitor. -Continue thiamine at 100 mg daily. -PT/OT evaluation are recommending SNF placement-had 1 bed offer for today but unable to discharge due to worsening of his encephalopathy.  UTI.  Patient did had mild leukocytosis, UA done which shows positive nitrites and few bacteria, no leukocytosis.  Due to worsening of his encephalopathy urine cultures were ordered and he will be started on ceftriaxone. -Urine culture results are still pending.  Leukocytosis.  Patient has some worsening in his leukocytosis at 12.1 today.  Remained afebrile.  Procalcitonin negative.  Chest x-ray was read as a small left-sided loculated effusion with some left base opacity.  I did not appreciate those findings myself.  Not much upper respiratory symptoms.  Patient is on ceftriaxone. -If you develop respiratory symptoms we will clarify those findings with CT chest.  Lower abdominal pain.  Can be due to UTI.  Not sure when he had his last BM. -Regular bowel regimen. -Give him soapsuds enema.  Tube feeding.  Discontinued on 07/20/2020. NG tube was placed after 4 days of hospitalization with continuation of severe encephalopathy and unable to take any p.o. he did receive 2 feet for 2 days.   Swallow evaluation is recommending dysphagia diet.  Patient is able to eat but still requires some assistance.  Sepsis ruled out as there is no obvious source of infection and leukocytosis resolved.  Elevated troponin.  EKG without any acute changes.  Barely positive troponin most likely secondary to demand with a flat curve.  Electrolyte abnormalities.  Resolved. -Encourage p.o. hydration. -Replete as needed and monitor.  Right upper extremity edema.  Resolved  Venous Doppler obtained yesterday for the concern of right  upper extremity edema which was negative for DVT.  AKI with CKD stage IIIa.  Resolved with IV hydration.  Creatinine below 1 now. -Continue to monitor. -Avoid nephrotoxins.  Depression.  -Psych was consulted -recommending starting Lexapro and there is no need for inpatient behavioral health. -Continue home dose of Remeron and Lexapro.  Objective: Vitals:   07/28/20 2124 07/29/20 0145 07/29/20 0615 07/29/20 0752  BP: (!) 139/97 (!) 164/83 139/89 132/76  Pulse: 83 83 78 80  Resp: 18 19 17 18   Temp: 98.6 F (37 C) 98.9 F (37.2 C) 98.8 F (37.1 C) 97.8 F (36.6 C)  TempSrc:  Oral Oral   SpO2: 99% 100% 99% 100%  Weight:      Height:        Intake/Output Summary (Last 24 hours) at 07/29/2020 1222 Last data filed at 07/29/2020 0900 Gross per 24 hour  Intake 120 ml  Output 1150 ml  Net -1030 ml   Filed Weights   07/26/20 0312 07/27/20 0434 07/28/20 0327  Weight: 80.3 kg 80.6 kg 78.9 kg    Examination:  General.  Chronically ill-appearing gentleman, in no acute distress. Pulmonary.  Lungs clear bilaterally, normal respiratory effort. CV.  Regular rate and rhythm, no JVD, rub or murmur. Abdomen.  Soft, nontender, nondistended, BS positive. CNS.  Alert and oriented x3.  No focal neurologic deficit. Extremities. Trace LE edema, no cyanosis, pulses intact and symmetrical. Psychiatry.  Judgment and insight appears normal.  DVT prophylaxis: Lovenox Code Status: Full Family Communication: Daughter was updated on phone. Disposition Plan:  Status is: Inpatient  Remains inpatient appropriate because:Inpatient level of care appropriate due to severity of illness   Dispo: The patient is from: Home              Anticipated d/c is to: SNF              Anticipated d/c date is: 1-2 days.              Patient currently is not medically stable for discharge. Daughter wants to have him close to [place].  Waiting for urine culture results before making decision about discharge.     Consultants:   PCCM  Neurology  Psychiatry.  Procedures:  Antimicrobials:  Ceftriaxone  Data Reviewed: I have personally reviewed following labs and imaging studies  CBC: Recent Labs  Lab 07/23/20 0515 07/27/20 0444 07/29/20 0233  WBC 10.7* 11.1* 12.1*  NEUTROABS 8.1*  --   --   HGB 10.2* 9.7* 9.9*  HCT 30.5* 29.9* 29.1*  MCV 99.3 102.0* 97.0  PLT 158 233 251   Basic Metabolic Panel: Recent Labs  Lab 07/23/20 0515 07/23/20 0515 07/24/20 0647 07/24/20 0647 07/25/20 0522 07/26/20 0454 07/27/20 0444 07/28/20 1056 07/29/20 0233  NA 149*   < > 148*   < > 144 146* 146* 140 139  K 3.4*   < > 3.4*   < > 4.3 4.1 4.3 4.2 3.6  CL 109   < > 110   < > 109 108 107 101 101  CO2 30   < > 32   < > 32 33* 33* 30 32  GLUCOSE 105*   < > 119*   < > 122* 102* 103* 93 107*  BUN 20   < > 21   < > 18 22 26* 20 19  CREATININE 1.01   < > 0.93   < > 0.93 0.84 0.97 0.85 0.82  CALCIUM 8.4*   < > 8.7*   < > 8.7* 8.5* 8.7* 8.8* 8.8*  MG 1.8  --  2.1  --  1.9 1.7  --   --  1.7  PHOS 3.0  --  3.2  --  2.8  --   --   --  2.5   < > = values in this interval not displayed.   GFR: Estimated Creatinine Clearance: 92 mL/min (by C-G formula based on SCr of 0.82 mg/dL). Liver Function Tests: No results for input(s): AST, ALT, ALKPHOS, BILITOT, PROT, ALBUMIN in the last 168 hours. No results for input(s): LIPASE, AMYLASE in the last 168 hours. No results for input(s): AMMONIA in the last 168 hours. Coagulation Profile: No results for input(s): INR, PROTIME in the last 168 hours. Cardiac Enzymes: No results for input(s): CKTOTAL, CKMB, CKMBINDEX, TROPONINI in the last 168 hours. BNP (last 3 results) No results for input(s): PROBNP in the last 8760 hours. HbA1C: No results for input(s): HGBA1C in the last 72 hours. CBG: Recent Labs  Lab 07/28/20 0324 07/28/20 0812 07/28/20 1136 07/28/20 2127 07/29/20 0752  GLUCAP 122* 96 76 109* 124*   Lipid Profile: No results for input(s): CHOL,  HDL, LDLCALC, TRIG, CHOLHDL, LDLDIRECT in the last 72 hours. Thyroid Function Tests: No results for input(s): TSH, T4TOTAL, FREET4, T3FREE, THYROIDAB in the last 72 hours. Anemia Panel: Recent Labs    07/27/20 0444  VITAMINB12 428   Sepsis Labs: Recent Labs  Lab 07/29/20 0233  PROCALCITON <  0.10    No results found for this or any previous visit (from the past 240 hour(s)).   Radiology Studies: DG Chest 2 View  Result Date: 07/29/2020 CLINICAL DATA:  Unresponsive EXAM: CHEST - 2 VIEW COMPARISON:  July 22, 2020 FINDINGS: There is loculated pleural effusion on the left with airspace opacity in the posterior left base. Lungs elsewhere are clear. Heart size and pulmonary vascular normal. No adenopathy. There is aortic atherosclerosis. There old healed rib fractures bilaterally. There is degenerative change in the thoracic spine. IMPRESSION: Loculated pleural effusion on the left with airspace opacity in the posterior left base. Lungs otherwise clear. Heart size normal. Aortic Atherosclerosis (ICD10-I70.0). Electronically Signed   By: Bretta Bang III M.D.   On: 07/29/2020 08:58    Scheduled Meds: . Chlorhexidine Gluconate Cloth  6 each Topical Daily  . enoxaparin (LOVENOX) injection  40 mg Subcutaneous Q24H  . escitalopram  5 mg Oral Daily  . famotidine  20 mg Oral BID  . feeding supplement (NEPRO CARB STEADY)  237 mL Oral BID BM  . folic acid  1 mg Oral Daily  . gabapentin  100 mg Oral Q8H  . losartan  25 mg Oral Daily  . mouth rinse  15 mL Mouth Rinse BID  . mirtazapine  15 mg Oral QHS  . multivitamin with minerals  1 tablet Oral Daily  . nicotine  14 mg Transdermal Daily  . pantoprazole sodium  20 mg Oral Daily  . sodium chloride flush  10-40 mL Intracatheter Q12H  . thiamine  100 mg Oral Daily   Continuous Infusions: . cefTRIAXone (ROCEPHIN)  IV 1 g (07/28/20 2346)     LOS: 15 days   Time spent: 30 minutes.  Arnetha Courser, MD [facility]  If 7PM-7AM,  please contact night-coverage [email or web address]  07/29/2020, 12:22 PM   This record has been created using Conservation officer, historic buildings. Errors have been sought and corrected,but may not always be located. Such creation errors do not reflect on the standard of care.

## 2020-07-29 NOTE — Progress Notes (Signed)
Placed on low bed 

## 2020-07-30 ENCOUNTER — Emergency Department (HOSPITAL_COMMUNITY): Payer: Medicare Other

## 2020-07-30 ENCOUNTER — Other Ambulatory Visit: Payer: Self-pay

## 2020-07-30 ENCOUNTER — Encounter (HOSPITAL_COMMUNITY): Payer: Self-pay

## 2020-07-30 ENCOUNTER — Emergency Department (HOSPITAL_COMMUNITY)
Admission: EM | Admit: 2020-07-30 | Discharge: 2020-07-31 | Disposition: A | Discharge status: Home / Self Care | Payer: Medicare Other | Attending: Emergency Medicine | Admitting: Emergency Medicine

## 2020-07-30 DIAGNOSIS — S0081XA Abrasion of other part of head, initial encounter: Secondary | ICD-10-CM | POA: Insufficient documentation

## 2020-07-30 DIAGNOSIS — M25552 Pain in left hip: Secondary | ICD-10-CM | POA: Insufficient documentation

## 2020-07-30 DIAGNOSIS — R519 Headache, unspecified: Secondary | ICD-10-CM | POA: Insufficient documentation

## 2020-07-30 DIAGNOSIS — W19XXXA Unspecified fall, initial encounter: Secondary | ICD-10-CM

## 2020-07-30 DIAGNOSIS — W010XXA Fall on same level from slipping, tripping and stumbling without subsequent striking against object, initial encounter: Secondary | ICD-10-CM | POA: Insufficient documentation

## 2020-07-30 DIAGNOSIS — R402433 Glasgow coma scale score 3-8, at hospital admission: Secondary | ICD-10-CM

## 2020-07-30 DIAGNOSIS — M899 Disorder of bone, unspecified: Secondary | ICD-10-CM

## 2020-07-30 DIAGNOSIS — M25511 Pain in right shoulder: Secondary | ICD-10-CM | POA: Insufficient documentation

## 2020-07-30 DIAGNOSIS — Z87891 Personal history of nicotine dependence: Secondary | ICD-10-CM | POA: Insufficient documentation

## 2020-07-30 LAB — URINE CULTURE: Culture: >=100000 — AB

## 2020-07-30 LAB — GLUCOSE, CAPILLARY
Glucose-Capillary: 125 mg/dL — ABNORMAL HIGH (ref 70–99)
Glucose-Capillary: 102 mg/dL — ABNORMAL HIGH (ref 70–99)
Glucose-Capillary: 92 mg/dL (ref 70–99)

## 2020-07-30 MED ORDER — THIAMINE HCL 100 MG PO TABS: 100.0000 mg | ORAL_TABLET | Freq: Every day | ORAL | Status: AC

## 2020-07-30 MED ORDER — FOLIC ACID 1 MG PO TABS: 1.0000 mg | ORAL_TABLET | Freq: Every day | ORAL | Status: AC

## 2020-07-30 MED ORDER — CIPROFLOXACIN HCL 500 MG PO TABS
500.0000 mg | ORAL_TABLET | Freq: Two times a day (BID) | ORAL | Status: DC
  Administered 2020-07-30: 500 mg via ORAL
  Filled 2020-07-30 (×3): qty 1

## 2020-07-30 MED ORDER — POLYETHYLENE GLYCOL 3350 17 G PO PACK: 17.0000 g | PACK | Freq: Every day | ORAL | 0 refills | Status: AC

## 2020-07-30 MED ORDER — CIPROFLOXACIN HCL 500 MG PO TABS: 500.0000 mg | ORAL_TABLET | Freq: Two times a day (BID) | ORAL | 0 refills | Status: AC

## 2020-07-30 MED ORDER — ORAL CARE MOUTH RINSE: 15.0000 mL | Freq: Two times a day (BID) | OROMUCOSAL | 0 refills | Status: AC

## 2020-07-30 MED ORDER — ESCITALOPRAM OXALATE 10 MG PO TABS: 10.0000 mg | ORAL_TABLET | Freq: Every day | ORAL | Status: AC

## 2020-07-30 MED ORDER — NICOTINE 14 MG/24HR TD PT24: 14.0000 mg | MEDICATED_PATCH | Freq: Every day | TRANSDERMAL | 0 refills | Status: AC

## 2020-07-30 MED ORDER — NEPRO/CARBSTEADY PO LIQD: 237.0000 mL | Freq: Two times a day (BID) | ORAL | 0 refills | Status: AC

## 2020-07-30 MED ORDER — ACETAMINOPHEN 325 MG PO TABS
650.0000 mg | ORAL_TABLET | Freq: Once | ORAL | Status: AC
  Administered 2020-07-30: 650 mg via ORAL
  Filled 2020-07-30: qty 2

## 2020-07-30 MED ORDER — DOCUSATE SODIUM 100 MG PO CAPS: 100.0000 mg | ORAL_CAPSULE | Freq: Two times a day (BID) | ORAL | 0 refills | Status: AC | PRN

## 2020-07-30 MED ORDER — GABAPENTIN 300 MG/6ML PO SOLN: 100.0000 mg | Freq: Three times a day (TID) | ORAL | 12 refills | Status: AC

## 2020-07-30 MED ORDER — LOSARTAN POTASSIUM 25 MG PO TABS: 25.0000 mg | ORAL_TABLET | Freq: Every day | ORAL | Status: AC

## 2020-07-30 MED ORDER — ADULT MULTIVITAMIN W/MINERALS CH: 1.0000 | ORAL_TABLET | Freq: Every day | ORAL | Status: AC

## 2020-07-30 MED ORDER — PANTOPRAZOLE SODIUM 40 MG PO PACK: 20.0000 mg | PACK | Freq: Every day | ORAL | Status: AC

## 2020-07-30 NOTE — Discharge Instructions (Signed)
He did not have any evidence of acute injury as a result of your fall.  Your x-ray did show a 2.5cm lytic lesion in the proximal right humerus.this is of unclear significance and should be followed and reassessed by your primary care doctor in the next 1 to 2 months.  We recommend Tylenol or ibuprofen for management of pain.  You may return to the ED for new or concerning symptoms.

## 2020-07-30 NOTE — ED Provider Notes (Signed)
[facility]-EMERGENCY DEPT Provider Note   CSN: [id number] Arrival date & time: 07/30/20  2054     History Chief Complaint  Patient presents with  . Fall    Joseph Owen is a 69 y.o. male.  69 y.o. male with a past history of chronic pain, depression, chronic history of frequent falls and unsteadiness (worked up by neurologist in the past), alcohol abuse, esophageal stenosis s/p dilation in July 2021, and GERD presents to the ED from [facility] after an unwitnessed fall tonight.  Patient states that he was getting up at dinner to return his meal tray when he tripped and fell forward. States his head struck the floor. He denies LOC. Reports headache since fall, but DENIES neck pain. Feels like his neck has bothered him only since application of C-collar by EMS. Complaining also of pain in his right shoulder and LEFT hip. Has not ambulated since the fall. Usually uses an assist device, but did not use one today. No medications taken PTA for symptoms.  Discharged to SNF today after prolonged hospitalization.   Fall       History reviewed. No pertinent past medical history.  Patient Active Problem List   Diagnosis Date Noted  . Pressure injury of skin 07/27/2020  . Major depressive disorder, recurrent episode, moderate (HCC) 07/18/2020  . Altered mental status   . Glasgow coma scale total score 3-8 (HCC)   . AKI (acute kidney injury) (HCC)   . Swelling of arm   . Respiratory failure (HCC) 07/14/2020  . Falls 03/08/2016  . Left foot drop 03/08/2016    Past Surgical History:  Procedure Laterality Date  . lower back surgery x 3          No family history on file.  Social History   Tobacco Use  . Smoking status: Former Games developer  . Smokeless tobacco: Never Used  Substance Use Topics  . Alcohol use: Not on file  . Drug use: Not on file    Home Medications Prior to Admission medications   Medication Sig Start Date End Date Taking?  Authorizing Provider  ciprofloxacin (CIPRO) 500 MG tablet Take 1 tablet (500 mg total) by mouth 2 (two) times daily for 5 days. 07/30/20 08/04/20  Arnetha Courser, MD  docusate sodium (COLACE) 100 MG capsule Take 1 capsule (100 mg total) by mouth 2 (two) times daily as needed for mild constipation. 07/30/20   Arnetha Courser, MD  escitalopram (LEXAPRO) 10 MG tablet Take 1 tablet (10 mg total) by mouth daily. 07/30/20   Arnetha Courser, MD  fluticasone (FLONASE) 50 MCG/ACT nasal spray Place 2 sprays into both nostrils daily.     [provider]  folic acid (FOLVITE) 1 MG tablet Take 1 tablet (1 mg total) by mouth daily. 07/30/20   Arnetha Courser, MD  gabapentin (NEURONTIN) 300 MG/6ML solution Take 2 mLs (100 mg total) by mouth every 8 (eight) hours. 07/30/20   Arnetha Courser, MD  losartan (COZAAR) 25 MG tablet Take 1 tablet (25 mg total) by mouth daily. 07/30/20   Arnetha Courser, MD  mirtazapine (REMERON) 15 MG tablet Take 15 mg by mouth at bedtime. 06/10/20   [provider]  Mouthwashes (MOUTH RINSE) LIQD solution 15 mLs by Mouth Rinse route 2 (two) times daily. 07/30/20   Arnetha Courser, MD  Multiple Vitamin (MULTIVITAMIN WITH MINERALS) TABS tablet Take 1 tablet by mouth daily. 07/30/20   Arnetha Courser, MD  nicotine (NICODERM CQ - DOSED IN MG/24 HOURS)  14 mg/24hr patch Place 1 patch (14 mg total) onto the skin daily. 07/30/20   Arnetha Courser, MD  Nutritional Supplements (FEEDING SUPPLEMENT, NEPRO CARB STEADY,) LIQD Take 237 mLs by mouth 2 (two) times daily between meals. 07/30/20   Arnetha Courser, MD  pantoprazole sodium (PROTONIX) 40 mg/20 mL PACK Take 10 mLs (20 mg total) by mouth daily. 07/30/20   Arnetha Courser, MD  polyethylene glycol (MIRALAX / GLYCOLAX) 17 g packet Take 17 g by mouth daily. 07/30/20   Arnetha Courser, MD  thiamine 100 MG tablet Take 1 tablet (100 mg total) by mouth daily. 07/30/20   Arnetha Courser, MD    Allergies    Patient has no known allergies.  Review of Systems   Review of  Systems  Ten systems reviewed and are negative for acute change, except as noted in the HPI.    Physical Exam Updated Vital Signs BP 119/67   Pulse 71   Temp 98.6 F (37 C) (Oral)   Resp 18   Ht 5\' 9"  (1.753 m)   Wt 86.2 kg   SpO2 100%   BMI 28.06 kg/m   Physical Exam Vitals and nursing note reviewed.  Constitutional:      General: He is not in acute distress.    Appearance: He is well-developed. He is not diaphoretic.     Comments: Patient calm and in NAD  HENT:     Head: Normocephalic.     Comments: Abrasions to forehead with punctate wound to midline at edge of hair line. No active bleeding. No hematoma to scalp.    Right Ear: External ear normal.     Left Ear: External ear normal.     Mouth/Throat:     Mouth: Mucous membranes are dry.     Comments: Tongue midline. Eyes:     General: No scleral icterus.    Extraocular Movements: Extraocular movements intact.     Conjunctiva/sclera: Conjunctivae normal.     Pupils: Pupils are equal, round, and reactive to light.  Neck:     Comments: No tenderness to palpation to the cervical midline.  No bony deformities, step-offs, crepitus. Pulmonary:     Effort: Pulmonary effort is normal. No respiratory distress.     Comments: Respirations even and unlabored. Musculoskeletal:        General: Normal range of motion.     Cervical back: Normal range of motion.     Comments: Exam limited 2/2 effort, but ROM of all extremities appears preserved and symmetric. Unable to reproduce pain to R shoulder or L hip with palpation. No deformity or crepitus to the right shoulder. No leg shortening or malrotation of the LLE.   Skin:    General: Skin is warm and dry.     Coloration: Skin is not pale.     Findings: No erythema or rash.  Neurological:     Mental Status: He is alert.     Comments: Speech is clear, goal oriented.  Answers questions appropriately and follows commands.  Moving all extremities spontaneously. No obvious focal deficit.    Psychiatric:        Behavior: Behavior is slowed.     ED Results / Procedures / Treatments   Labs (all labs ordered are listed, but only abnormal results are displayed) Labs Reviewed - No data to display  EKG None  Radiology DG Chest 2 View  Result Date: 07/29/2020 CLINICAL DATA:  Unresponsive EXAM: CHEST - 2 VIEW COMPARISON:  July 22, 2020 FINDINGS: There  is loculated pleural effusion on the left with airspace opacity in the posterior left base. Lungs elsewhere are clear. Heart size and pulmonary vascular normal. No adenopathy. There is aortic atherosclerosis. There old healed rib fractures bilaterally. There is degenerative change in the thoracic spine. IMPRESSION: Loculated pleural effusion on the left with airspace opacity in the posterior left base. Lungs otherwise clear. Heart size normal. Aortic Atherosclerosis (ICD10-I70.0). Electronically Signed   By: Bretta BangWilliam  Woodruff III M.D.   On: 07/29/2020 08:58   DG Lumbar Spine Complete  Result Date: 07/30/2020 CLINICAL DATA:  Pain status post fall EXAM: LUMBAR SPINE - COMPLETE 4+ VIEW COMPARISON:  March 29, 2010 FINDINGS: There is no acute compression fracture. No significant malalignment. Multilevel degenerative changes are noted throughout the lumbar spine, greatest at the L4-L5 and L5-S1 levels. There is facet arthrosis in the lower lumbar segments. Atherosclerotic changes are noted of the partially visualized abdominal aorta. IMPRESSION: 1. No acute compression fracture or significant malalignment. 2. Multilevel degenerative changes of the lumbar spine, greatest at the L4-L5 and L5-S1 levels. Electronically Signed   By: Katherine Mantlehristopher  Green M.D.   On: 07/30/2020 23:16   DG Shoulder Right  Result Date: 07/30/2020 CLINICAL DATA:  Pain status post fall EXAM: RIGHT SHOULDER - 2+ VIEW COMPARISON:  None. FINDINGS: There is no acute displaced fracture or dislocation. Mild degenerative changes are noted of the glenohumeral and  acromioclavicular joints. There is a lytic lesion in the proximal right humeral diaphysis measuring approximately 2.5 cm. There are multiple old healed right-sided rib fractures. IMPRESSION: 1. No acute displaced fracture or dislocation. 2. Lytic lesion in the proximal right humeral diaphysis measuring approximately 2.5 cm. This is of unknown clinical significance. Comparison to outside prior studies would be useful if available. Otherwise consider a three-month follow-up radiograph or nonemergent bone scan. 3. Mild degenerative changes of the right shoulder. Electronically Signed   By: Katherine Mantlehristopher  Green M.D.   On: 07/30/2020 23:15   CT Head Wo Contrast  Result Date: 07/30/2020 CLINICAL DATA:  Unwitnessed fall EXAM: CT HEAD WITHOUT CONTRAST TECHNIQUE: Contiguous axial images were obtained from the base of the skull through the vertex without intravenous contrast. COMPARISON:  MRI July 17, 2020 FINDINGS: Brain: No evidence of acute territorial infarction, hemorrhage, hydrocephalus,extra-axial collection or mass lesion/mass effect. Normal gray-white differentiation. Ventricles are normal in size and contour. Vascular: No hyperdense vessel or unexpected calcification. Skull: The skull is intact. No fracture or focal lesion identified. Sinuses/Orbits: The visualized paranasal sinuses and mastoid air cells are clear. The orbits and globes intact. Other: Small soft tissue hematoma overlying the left frontal skull. IMPRESSION: No acute intracranial abnormality. Electronically Signed   By: Jonna ClarkBindu  Avutu M.D.   On: 07/30/2020 23:13   DG Hip Unilat W or Wo Pelvis 2-3 Views Left  Result Date: 07/30/2020 CLINICAL DATA:  Pain status post fall EXAM: DG HIP (WITH OR WITHOUT PELVIS) 2-3V LEFT COMPARISON:  None. FINDINGS: There is no acute displaced fracture or dislocation. There is mild bilateral hip osteoarthritis, left worse than right. IMPRESSION: 1. No acute displaced fracture or dislocation. 2. Mild bilateral hip  osteoarthritis, left worse than right. Electronically Signed   By: Katherine Mantlehristopher  Green M.D.   On: 07/30/2020 23:17    Procedures Procedures (including critical care time)  Medications Ordered in ED Medications  acetaminophen (TYLENOL) tablet 650 mg (650 mg Oral Given 07/30/20 2335)    ED Course  I have reviewed the triage vital signs and the nursing notes.  Pertinent labs &  imaging results that were available during my care of the patient were reviewed by me and considered in my medical decision making (see chart for details).  Clinical Course as of Aug 01 43  Thu Jul 30, 2020  2354 Patient assessed at bedside by MD [facility]. Agrees with plan for discharge.   [KH]    Clinical Course User Index [KH] Darylene Price   MDM Rules/Calculators/A&P                          69 year old male presents to the ED following an unwitnessed fall at his skilled nursing facility. Reports the fall to be mechanical, striking his head on the hard floor. Denies loss of consciousness. Does have an abrasion to his forehead and a punctate laceration to midline forehead at hairline. No active bleeding. He underwent head CT which shows no acute intracranial process. Also complaining of right shoulder pain and left hip pain. X-rays do not show acute fracture, dislocation, deformity.  Nonspecific lytic lesion of the right humerus noted. This is appropriate for outpatient follow-up. Will refer back to primary care. His vitals have been stable.  Plan for discharge back to SNF with supportive care instructions.   Final Clinical Impression(s) / ED Diagnoses Final diagnoses:  Fall, initial encounter  Abrasion of forehead, initial encounter  Acute pain of right shoulder  Left hip pain  Lytic lesion of bone on x-ray    Rx / DC Orders ED Discharge Orders    None       Antony Madura, PA-C 07/31/20 0046    Gwyneth Sprout, MD 08/03/20 [phone]

## 2020-07-30 NOTE — Care Management Important Message (Signed)
Important Message  Patient Details  Name: Joseph Owen MRN: [id number] Date of Birth: 12-23-1950   Medicare Important Message Given:  Yes     [name] 07/30/2020, 12:19 PM

## 2020-07-30 NOTE — Discharge Summary (Signed)
Physician Discharge Summary  Joseph Owen ZOX:[id number] DOB: 16-Apr-1951 DOA: 07/14/2020  PCP: Jaclyn Shaggy, MD  Admit date: 07/14/2020 Discharge date: 07/30/2020  Admitted From: Home Disposition: SNF   Recommendations for Outpatient Follow-up:  1. Follow up with PCP in 1-2 weeks 2. Please obtain BMP/CBC in one week 3. Please follow up on the following pending results: Final urine culture susceptibility results.  Home Health: No Equipment/Devices: Rolling walker Discharge Condition: Stable CODE STATUS: Full Diet recommendation:  Dysphagia   Brief/Interim Summary: Joseph Owen a 69 y.o.malewith a past history of chronic pain, depression chronic history of frequent fall and unsteadiness which has been worked up by neurologist in the past, alcohol abuse and GERD(found to have some esophageal stenosis which was dilated in July 2021) presented to ED after being found unresponsive on the bathroom floor with the tub overflowing with water.  Patient lives alone and family last talked with him 24 hours ago.  Unable to obtained significant history.  On arrival he was significantly hypoxic and unresponsive requiring emergent intubation.  Extubated on 07/15/2020. On arrival labs are positive for leukocytosis with WBC count of 27.7 and neutrophils of 24.9, no obvious source of infection, may be secondary to stress with fall and being unresponsive, multiple electrolyte abnormalities which include hypokalemia, hypomagnesemia and hypophosphatemia.  AKI with creatinine of 1.5.  Baseline around 1.1-1.2.  Lactic acidosis.  All electrolytes abnormalities and AKI resolved. Toxicology only positive for benzos in UDS.  CK elevated at 1100. Troponin mildly elevated, more consistent with demand.  CT head and cervical spine was without any acute abnormality.  Echocardiogram with normal EF and no wall motion abnormalities. Sepsis ruled out as there was no obvious source of infection on admission. All  cultures include CSF on admission were negative.  EEG with severe encephalopathy and no seizure-like activity.  Confabulations noted after couple of days when he was little more awake.  Completed the course of high-dose thiamine for concern of Wernicke's/Korsakoff encephalopathy.  Patient slowly started responding better, he did receive tube feed for about a week and later started eating.  Swallow team recommended dysphagia 1 diet with nectar thick liquids.  Patient was able to participate with physical therapy and they recommended SNF placement for further rehab. Patient still need help with feeding and continuous encouragement to keep himself well-hydrated.  Patient developed lower abdominal pain and become little more lethargic couple of days ago.  Started on regular bowel regimen and urine was sent for culture and he was started on ceftriaxone as he also developed a new leukocytosis.  Urine culture grew Pseudomonas aeruginosa and his antibiotics were switched with Cipro for 5 days according to sensitivity results.  Patient also developed upper extremity edema, right more than left most likely secondary to IV.  Venous Doppler was obtained and they were negative for DVT.  Patient has an history of depression and was admitted in behavioral health over the summer with suicidal attempt.  Psych evaluation was obtained and they recommend starting Lexapro along with his home dose of Remeron.  There was no need for inpatient behavioral health at this time.  Patient will need a close follow-up with her primary care provider for further needs.  Discharge Diagnoses:  Active Problems:   Respiratory failure (HCC)   Glasgow coma scale total score 3-8 (HCC)   AKI (acute kidney injury) (HCC)   Swelling of arm   Altered mental status   Major depressive disorder, recurrent episode, moderate (HCC)   Pressure  injury of skin  Discharge Instructions  Discharge Instructions    Diet - low sodium heart healthy    Complete by: As directed    Discharge wound care:   Complete by: As directed    Keep gel dressing on pressure injuries and  frequent postural change.   Increase activity slowly   Complete by: As directed      Allergies as of 07/30/2020   Not on File     Medication List    STOP taking these medications   furosemide 20 MG tablet Commonly known as: LASIX   gabapentin 100 MG capsule Commonly known as: NEURONTIN Replaced by: gabapentin 300 MG/6ML solution   HYDROcodone-acetaminophen 5-325 MG tablet Commonly known as: NORCO/VICODIN   meloxicam 7.5 MG tablet Commonly known as: MOBIC   pantoprazole 20 MG tablet Commonly known as: PROTONIX Replaced by: pantoprazole sodium 40 mg/20 mL Pack     TAKE these medications   ciprofloxacin 500 MG tablet Commonly known as: CIPRO Take 1 tablet (500 mg total) by mouth 2 (two) times daily for 5 days.   docusate sodium 100 MG capsule Commonly known as: COLACE Take 1 capsule (100 mg total) by mouth 2 (two) times daily as needed for mild constipation.   escitalopram 10 MG tablet Commonly known as: LEXAPRO Take 1 tablet (10 mg total) by mouth daily.   feeding supplement (NEPRO CARB STEADY) Liqd Take 237 mLs by mouth 2 (two) times daily between meals.   fluticasone 50 MCG/ACT nasal spray Commonly known as: FLONASE Place 2 sprays into both nostrils daily.   folic acid 1 MG tablet Commonly known as: FOLVITE Take 1 tablet (1 mg total) by mouth daily.   gabapentin 300 MG/6ML solution Commonly known as: NEURONTIN Take 2 mLs (100 mg total) by mouth every 8 (eight) hours. Replaces: gabapentin 100 MG capsule   losartan 25 MG tablet Commonly known as: COZAAR Take 1 tablet (25 mg total) by mouth daily.   mirtazapine 15 MG tablet Commonly known as: REMERON Take 15 mg by mouth at bedtime.   mouth rinse Liqd solution 15 mLs by Mouth Rinse route 2 (two) times daily.   multivitamin with minerals Tabs tablet Take 1 tablet by mouth  daily.   nicotine 14 mg/24hr patch Commonly known as: NICODERM CQ - dosed in mg/24 hours Place 1 patch (14 mg total) onto the skin daily.   pantoprazole sodium 40 mg/20 mL Pack Commonly known as: PROTONIX Take 10 mLs (20 mg total) by mouth daily. Replaces: pantoprazole 20 MG tablet   polyethylene glycol 17 g packet Commonly known as: MIRALAX / GLYCOLAX Take 17 g by mouth daily.   thiamine 100 MG tablet Take 1 tablet (100 mg total) by mouth daily.            Discharge Care Instructions  (From admission, onward)         Start     Ordered   07/30/20 0000  Discharge wound care:       Comments: Keep gel dressing on pressure injuries and  frequent postural change.   07/30/20 1300          Contact information for follow-up providers    Jaclyn Shaggyate, Denny C, MD. Schedule an appointment as soon as possible for a visit.   Specialty: Internal Medicine Contact information: [address]   [place] [address] [phone]            Contact information for after-discharge care    Destination  [facility] Preferred SNF .   Service: Skilled Nursing Contact information: [address] [place] [address] [phone]                 Not on File  Consultations:  PCCM  Neurology  Psychiatry  Procedures/Studies: EEG  Result Date: 07/16/2020 Charlsie Quest, MD     07/16/2020  3:37 PM Patient Name: Joseph Owen MRN: [id number] Epilepsy Attending: Charlsie Quest Referring Physician/Provider: Dr Arnetha Courser Date: 07/16/2020 Duration: 28.11 mins Patient history: 69 y.o. male admitted 2 days ago for acute hypoxic and hypercapnic respiratory failure, continues to have decreased responsiveness.EE to evaluate for seizure Level of alertness:  lethargic AEDs during EEG study: Gabapentin Technical aspects: This EEG study was done with scalp electrodes positioned according to the 10-20 International system of electrode  placement. Electrical activity was acquired at a sampling rate of 500Hz  and reviewed with a high frequency filter of 70Hz  and a low frequency filter of 1Hz . EEG data were recorded continuously and digitally stored. Description: No posterior dominant rhythm was seen. EEG showed continuous generalized polymorphic3 to 6 Hz theta-delta slowing. Hyperventilation and photic stimulation were not performed.   ABNORMALITY -Continued slow, generalized IMPRESSION: This study is suggestive of severe diffuse encephalopathy, nonspecific etiology. No seizures or epileptiform discharges were seen throughout the recording.   DG Chest 2 View  Result Date: 07/29/2020 CLINICAL DATA:  Unresponsive EXAM: CHEST - 2 VIEW COMPARISON:  July 22, 2020 FINDINGS: There is loculated pleural effusion on the left with airspace opacity in the posterior left base. Lungs elsewhere are clear. Heart size and pulmonary vascular normal. No adenopathy. There is aortic atherosclerosis. There old healed rib fractures bilaterally. There is degenerative change in the thoracic spine. IMPRESSION: Loculated pleural effusion on the left with airspace opacity in the posterior left base. Lungs otherwise clear. Heart size normal. Aortic Atherosclerosis (ICD10-I70.0). Electronically Signed   By: Charlsie Quest III M.D.   On: 07/29/2020 08:58   DG Abd 1 View  Result Date: 07/18/2020 CLINICAL DATA:  NG tube placement EXAM: ABDOMEN - 1 VIEW COMPARISON:  07/18/2020 FINDINGS: Again, the NG tube tip projects over the gastric body. However, the side hole again is likely located near the GE junction. The tube should be further advanced into the stomach by approximately 5 cm. The visualized bowel gas pattern is unremarkable. IMPRESSION: NG tube tip projects over the gastric body. However, the side hole remains near the GE junction. The tube should be further advanced into the stomach by approximately 5 cm. Electronically Signed   By:  09/28/2020 M.D.   On: 07/18/2020 16:40   DG Abd 1 View  Result Date: 07/18/2020 CLINICAL DATA:  NG tube placement EXAM: ABDOMEN - 1 VIEW COMPARISON:  07/14/2020 FINDINGS: The enteric tube projects over the gastric body. The side hole projects over the GE junction. There are old healed bilateral rib fractures. The visualized bowel gas pattern in the upper abdomen is unremarkable. IMPRESSION: Enteric tube projects over the gastric body. The side hole projects over the GE junction. The tube should be further advanced into the stomach. These results will be called to the ordering clinician or representative by the Radiologist Assistant, and communication documented in the PACS or 07/20/2020. Electronically Signed   By: 07/20/2020 M.D.   On: 07/18/2020 15:51   DG Abd 1 View  Result Date: 07/14/2020 CLINICAL DATA:  Enteric catheter placement EXAM: ABDOMEN - 1 VIEW COMPARISON:  07/14/2020 at 2:01 p.m. FINDINGS: Frontal view of the lower chest and upper abdomen demonstrates enteric catheter tip and side port projecting over the gastric fundus. Bowel gas pattern is unremarkable. Indeterminate radiodensities projecting over left upper quadrant, may be on or beneath the patient. IMPRESSION: 1. Enteric catheter tip projecting over gastric fundus. 2. Unremarkable bowel gas pattern. Electronically Signed   By: Sharlet Salina M.D.   On: 07/14/2020 22:35   DG Abdomen 1 View  Result Date: 07/14/2020 CLINICAL DATA:  Altered mental status EXAM: ABDOMEN - 1 VIEW COMPARISON:  None. FINDINGS: Orogastric tube tip and side port in stomach. No bowel dilatation or air-fluid level to suggest bowel obstruction. No free air. No abnormal calcifications. IMPRESSION: Orogastric tube tip and side port in stomach. No bowel obstruction or free air evident on supine examination. Electronically Signed   By: Bretta Bang III M.D.   On: 07/14/2020 14:14   CT HEAD WO CONTRAST  Result Date: 07/14/2020 CLINICAL  DATA:  Found down EXAM: CT HEAD WITHOUT CONTRAST CT MAXILLOFACIAL WITHOUT CONTRAST CT CERVICAL SPINE WITHOUT CONTRAST TECHNIQUE: Multidetector CT imaging of the head, cervical spine, and maxillofacial structures were performed using the standard protocol without intravenous contrast. Multiplanar CT image reconstructions of the cervical spine and maxillofacial structures were also generated. COMPARISON:  May 07, 2018. FINDINGS: CT HEAD FINDINGS Brain: No evidence of acute infarction, hemorrhage, hydrocephalus, extra-axial collection or mass lesion/mass effect. Vascular: No hyperdense vessel or unexpected calcification. Skull: Normal. Negative for fracture or focal lesion. Other: None. CT MAXILLOFACIAL FINDINGS Osseous: No fracture or mandibular dislocation. No destructive process. Orbits: Negative. No traumatic or inflammatory finding. Sinuses: Mucosal thickening throughout the ethmoid air cells, bilateral sphenoid sinuses, maxillary sinuses and frontal sinuses. Soft tissues: Soft tissue edema of the LEFT frontal scalp. CT CERVICAL SPINE FINDINGS Alignment: Normal. Skull base and vertebrae: No acute fracture. No primary bone lesion or focal pathologic process. Soft tissues and spinal canal: No prevertebral fluid or swelling. No visible canal hematoma. Disc levels: Intervertebral disc space height loss is most pronounced at C5-6 and C6-7. Posterior disc osteophyte complex at C6-7 and C5-6 without high-grade canal stenosis. Multilevel uncovertebral hypertrophy. Multilevel flowing anterior osteophytes. Upper chest: Remote RIGHT clavicular fracture. Other: Mild-to-moderate atherosclerotic calcifications of the RIGHT greater than LEFT carotid bulb. Partial visualization of ETT tube and enteric tube. IMPRESSION: 1.  No acute intracranial abnormality. 2. No acute facial bone fracture. 3. No acute fracture or subluxation of the cervical spine. 4. Soft tissue edema of the LEFT frontal scalp. Electronically Signed   By:  Meda Klinefelter MD   On: 07/14/2020 14:47   CT Cervical Spine Wo Contrast  Result Date: 07/14/2020 CLINICAL DATA:  Found down EXAM: CT HEAD WITHOUT CONTRAST CT MAXILLOFACIAL WITHOUT CONTRAST CT CERVICAL SPINE WITHOUT CONTRAST TECHNIQUE: Multidetector CT imaging of the head, cervical spine, and maxillofacial structures were performed using the standard protocol without intravenous contrast. Multiplanar CT image reconstructions of the cervical spine and maxillofacial structures were also generated. COMPARISON:  May 07, 2018. FINDINGS: CT HEAD FINDINGS Brain: No evidence of acute infarction, hemorrhage, hydrocephalus, extra-axial collection or mass lesion/mass effect. Vascular: No hyperdense vessel or unexpected calcification. Skull: Normal. Negative for fracture or focal lesion. Other: None. CT MAXILLOFACIAL FINDINGS Osseous: No fracture or mandibular dislocation. No destructive process. Orbits: Negative. No traumatic or inflammatory finding. Sinuses: Mucosal thickening throughout the ethmoid air cells, bilateral sphenoid sinuses, maxillary sinuses and frontal sinuses. Soft tissues: Soft tissue edema of the LEFT frontal scalp. CT CERVICAL SPINE  FINDINGS Alignment: Normal. Skull base and vertebrae: No acute fracture. No primary bone lesion or focal pathologic process. Soft tissues and spinal canal: No prevertebral fluid or swelling. No visible canal hematoma. Disc levels: Intervertebral disc space height loss is most pronounced at C5-6 and C6-7. Posterior disc osteophyte complex at C6-7 and C5-6 without high-grade canal stenosis. Multilevel uncovertebral hypertrophy. Multilevel flowing anterior osteophytes. Upper chest: Remote RIGHT clavicular fracture. Other: Mild-to-moderate atherosclerotic calcifications of the RIGHT greater than LEFT carotid bulb. Partial visualization of ETT tube and enteric tube. IMPRESSION: 1.  No acute intracranial abnormality. 2. No acute facial bone fracture. 3. No acute fracture or  subluxation of the cervical spine. 4. Soft tissue edema of the LEFT frontal scalp. Electronically Signed   By: Meda Klinefelter MD   On: 07/14/2020 14:47   MR BRAIN WO CONTRAST  Result Date: 07/17/2020 CLINICAL DATA:  Transient ischemic attack (TIA).  Falls. EXAM: MRI HEAD WITHOUT CONTRAST TECHNIQUE: Multiplanar, multiecho pulse sequences of the brain and surrounding structures were obtained without intravenous contrast. COMPARISON:  CT head 07/14/2020 FINDINGS: Evaluation is limited by motion. Brain: No acute infarction, hemorrhage, hydrocephalus, extra-axial collection or mass lesion. Tiny remote right cerebellar lacunar infarct. Mild prominence of the bifrontal extra-axial spaces. Minimal scattered T2/FLAIR hyperintensities, compatible with age-appropriate chronic microvascular ischemic disease. Mild diffuse cerebral volume loss. Vascular: Flow voids of the proximal major intracranial arteries are maintained. Skull and upper cervical spine: Limited evaluation given patient motion on the sagittal T1 sequence. Grossly normal marrow signal. Sinuses/Orbits: Mild ethmoid air cell and sphenoid sinus mucosal thickening without air-fluid levels. Negative orbits. Other: Small bilateral mastoid effusions. IMPRESSION: Motion limited exam without evidence of acute intracranial abnormality. Specifically, no acute infarct. Electronically Signed   By: Feliberto Harts MD   On: 07/17/2020 10:38   US Venous Img Upper Uni Right(DVT)  Result Date: 07/16/2020 CLINICAL DATA:  Right upper extremity edema. Patient is currently on anticoagulation. Evaluate for DVT. EXAM: RIGHT UPPER EXTREMITY VENOUS DOPPLER ULTRASOUND TECHNIQUE: Gray-scale sonography with graded compression, as well as color Doppler and duplex ultrasound were performed to evaluate the upper extremity deep venous system from the level of the subclavian vein and including the jugular, axillary, basilic, radial, ulnar and upper cephalic vein. Spectral Doppler  was utilized to evaluate flow at rest and with distal augmentation maneuvers. COMPARISON:  None. FINDINGS: Contralateral Subclavian Vein: Respiratory phasicity is normal and symmetric with the symptomatic side. No evidence of thrombus. Normal compressibility. Internal Jugular Vein: No evidence of thrombus. Normal compressibility, respiratory phasicity and response to augmentation. Subclavian Vein: No evidence of thrombus. Normal compressibility, respiratory phasicity and response to augmentation. Axillary Vein: No evidence of thrombus. Normal compressibility, respiratory phasicity and response to augmentation. Cephalic Vein: No evidence of thrombus. Normal compressibility, respiratory phasicity and response to augmentation. Basilic Vein: There is age-indeterminate mixed echogenic expansile occlusive thrombus within the right basilic vein (images 25, 26 and 27). Brachial Veins: No evidence of thrombus. Normal compressibility, respiratory phasicity and response to augmentation. Radial Veins: No evidence of thrombus. Normal compressibility, respiratory phasicity and response to augmentation. Ulnar Veins: No evidence of thrombus. Normal compressibility, respiratory phasicity and response to augmentation. Venous Reflux:  None visualized. Other Findings:  None visualized. IMPRESSION: 1. No evidence of DVT within the right upper extremity. 2. Examination is positive for age-indeterminate occlusive superficial thrombophlebitis involving the right basilic vein. There is no extension of this age-indeterminate, potentially chronic, occlusive SVT to the venous system of the right upper extremity. Electronically Signed  By: Simonne Come M.D.   On: 07/16/2020 16:26   DG Chest Port 1 View  Result Date: 07/22/2020 CLINICAL DATA:  Respiratory failure. EXAM: PORTABLE CHEST 1 VIEW COMPARISON:  July 17, 2020. FINDINGS: Stable cardiomediastinal silhouette. Old bilateral rib fractures are noted. No pneumothorax or pleural  effusion is noted. Lungs are clear. IMPRESSION: No active disease. Electronically Signed   By: Lupita Raider M.D.   On: 07/22/2020 13:53   DG Chest Port 1 View  Result Date: 07/17/2020 CLINICAL DATA:  Cough. EXAM: PORTABLE CHEST 1 VIEW COMPARISON:  July 15, 2020. FINDINGS: Stable cardiomediastinal silhouette. No pneumothorax or pleural effusion is noted. Lungs are clear. Endotracheal and nasogastric tubes have been removed. Bilateral rib fractures are again noted. IMPRESSION: No acute cardiopulmonary abnormality seen. Electronically Signed   By: Lupita Raider M.D.   On: 07/17/2020 12:51   DG Chest Port 1 View  Result Date: 07/15/2020 CLINICAL DATA:  Acute respiratory failure EXAM: PORTABLE CHEST 1 VIEW COMPARISON:  Yesterday FINDINGS: Endotracheal tube with tip at the clavicular heads. The enteric tube loops at the stomach. Lower lung volumes. No edema or focal infiltrate. No effusion or pneumothorax. Remote rib fractures. Normal heart size. IMPRESSION: Stable hardware positioning and symmetric aeration. Electronically Signed   By: Marnee Spring M.D.   On: 07/15/2020 04:52   DG Chest Portable 1 View  Result Date: 07/14/2020 CLINICAL DATA:  Hypoxia EXAM: PORTABLE CHEST 1 VIEW COMPARISON:  August 05, 2010 FINDINGS: Endotracheal tube tip is 5.6 cm above the carina. Nasogastric tube tip is below the diaphragm. No pneumothorax. No edema or airspace opacity. Heart size and pulmonary vascularity are normal. No adenopathy. Old healed rib fractures noted on each side with remodeling. IMPRESSION: Endotracheal tube and nasogastric tube as described. No pneumothorax. Lungs clear. Cardiac silhouette within normal limits. Electronically Signed   By: Bretta Bang III M.D.   On: 07/14/2020 14:14   DG FLUORO GUIDED NEEDLE PLC ASPIRATION/INJECTION LOC  Result Date: 07/17/2020 CLINICAL DATA:  Fever. EXAM: DIAGNOSTIC LUMBAR PUNCTURE UNDER FLUOROSCOPIC GUIDANCE FLUOROSCOPY TIME:  Fluoroscopy Time:  1  minutes 36 seconds Radiation Exposure Index (if provided by the fluoroscopic device): 32.4 mGy Number of Acquired Spot Images: 1 PROCEDURE: After discussing the risks and benefits of this procedure the patient informed consent was obtained. Back was sterilely prepped and draped. Following local anesthesia with 1% lidocaine a 22 gauge spinal needle was advanced into the lumbar spinal canal at the level of L4-L5. Opening pressure of 15 cm water noted. 11 cc of clear CSF obtained and sent to the laboratory for further evaluation. Spinal needle removed and hemostasis achieved. Patient sent back to his room in stable clinical condition. IMPRESSION: Successful fluoroscopically directed lumbar puncture. 11 cc of clear CSF obtained and sent to laboratory for further evaluation. Electronically Signed   By: Maisie Fus  Register   On: 07/17/2020 17:00   ECHOCARDIOGRAM COMPLETE  Result Date: 07/15/2020    ECHOCARDIOGRAM REPORT   Patient Name:   Joseph Owen Date of Exam: 07/14/2020 Medical Rec #:  [id number]        Height:       72.0 in Accession #:    [id number]       Weight:       190.0 lb Date of Birth:  Apr 27, 1951        BSA:          2.085 m Patient Age:    39 years  BP:           142/76 mmHg Patient Gender: M                HR:           66 bpm. Exam Location:  [facility] Procedure: 2D Echo, Cardiac Doppler, Color Doppler and Intracardiac            Opacification Agent Indications:     Acute respiratory failure  History:         Patient has no prior history of Echocardiogram examinations.  Sonographer:     Sedonia Small Rodgers-Jones Referring Phys:  Erin Fulling Diagnosing Phys: Alwyn Pea MD  Sonographer Comments: Technically difficult study due to poor echo windows and echo performed with patient supine and on artificial respirator. IMPRESSIONS  1. Left ventricular ejection fraction, by estimation, is 50 to 55%. The left ventricle has low normal function. The left ventricle has no regional wall motion  abnormalities. Left ventricular diastolic parameters were normal.  2. Right ventricular systolic function is low normal. The right ventricular size is normal.  3. The mitral valve is normal in structure. No evidence of mitral valve regurgitation.  4. The aortic valve is normal in structure. Aortic valve regurgitation is not visualized. FINDINGS  Left Ventricle: Left ventricular ejection fraction, by estimation, is 50 to 55%. The left ventricle has low normal function. The left ventricle has no regional wall motion abnormalities. Definity contrast agent was given IV to delineate the left ventricular endocardial borders. The left ventricular internal cavity size was normal in size. There is no left ventricular hypertrophy. Left ventricular diastolic parameters were normal. Right Ventricle: The right ventricular size is normal. No increase in right ventricular wall thickness. Right ventricular systolic function is low normal. Left Atrium: Left atrial size was normal in size. Right Atrium: Right atrial size was normal in size. Pericardium: There is no evidence of pericardial effusion. Mitral Valve: The mitral valve is normal in structure. No evidence of mitral valve regurgitation. Tricuspid Valve: The tricuspid valve is normal in structure. Tricuspid valve regurgitation is not demonstrated. Aortic Valve: The aortic valve is normal in structure. Aortic valve regurgitation is not visualized. Pulmonic Valve: The pulmonic valve was normal in structure. Pulmonic valve regurgitation is not visualized. Aorta: Aortic root could not be assessed. IAS/Shunts: No atrial level shunt detected by color flow Doppler.  LEFT VENTRICLE PLAX 2D LVIDd:         3.85 cm  Diastology LVIDs:         2.81 cm  LV e' medial:    3.26 cm/s LV PW:         0.93 cm  LV E/e' medial:  11.8 LV IVS:        0.88 cm  LV e' lateral:   4.35 cm/s LVOT diam:     2.10 cm  LV E/e' lateral: 8.8 LVOT Area:     3.46 cm  IVC IVC diam: 1.68 cm LEFT ATRIUM         Index  LA diam:    3.60 cm 1.73 cm/m   AORTA Ao Root diam: 3.70 cm MITRAL VALVE MV Area (PHT): 3.03 cm    SHUNTS MV Decel Time: 250 msec    Systemic Diam: 2.10 cm MV E velocity: 38.40 cm/s MV A velocity: 48.00 cm/s MV E/A ratio:  0.80 Dwayne D Callwood MD Electronically signed by Alwyn Pea MD Signature Date/Time: 07/15/2020/5:44:14 PM    Final    CT Maxillofacial  Wo Contrast  Result Date: 07/14/2020 CLINICAL DATA:  Found down EXAM: CT HEAD WITHOUT CONTRAST CT MAXILLOFACIAL WITHOUT CONTRAST CT CERVICAL SPINE WITHOUT CONTRAST TECHNIQUE: Multidetector CT imaging of the head, cervical spine, and maxillofacial structures were performed using the standard protocol without intravenous contrast. Multiplanar CT image reconstructions of the cervical spine and maxillofacial structures were also generated. COMPARISON:  May 07, 2018. FINDINGS: CT HEAD FINDINGS Brain: No evidence of acute infarction, hemorrhage, hydrocephalus, extra-axial collection or mass lesion/mass effect. Vascular: No hyperdense vessel or unexpected calcification. Skull: Normal. Negative for fracture or focal lesion. Other: None. CT MAXILLOFACIAL FINDINGS Osseous: No fracture or mandibular dislocation. No destructive process. Orbits: Negative. No traumatic or inflammatory finding. Sinuses: Mucosal thickening throughout the ethmoid air cells, bilateral sphenoid sinuses, maxillary sinuses and frontal sinuses. Soft tissues: Soft tissue edema of the LEFT frontal scalp. CT CERVICAL SPINE FINDINGS Alignment: Normal. Skull base and vertebrae: No acute fracture. No primary bone lesion or focal pathologic process. Soft tissues and spinal canal: No prevertebral fluid or swelling. No visible canal hematoma. Disc levels: Intervertebral disc space height loss is most pronounced at C5-6 and C6-7. Posterior disc osteophyte complex at C6-7 and C5-6 without high-grade canal stenosis. Multilevel uncovertebral hypertrophy. Multilevel flowing anterior osteophytes. Upper  chest: Remote RIGHT clavicular fracture. Other: Mild-to-moderate atherosclerotic calcifications of the RIGHT greater than LEFT carotid bulb. Partial visualization of ETT tube and enteric tube. IMPRESSION: 1.  No acute intracranial abnormality. 2. No acute facial bone fracture. 3. No acute fracture or subluxation of the cervical spine. 4. Soft tissue edema of the LEFT frontal scalp. Electronically Signed   By: Meda Klinefelter MD   On: 07/14/2020 14:47    Subjective: Patient has no new complaints when seen today.  Had a good bowel movement yesterday.  He appears little groggy when I ask, he states that he is not a morning person.  Discharge Exam: Vitals:   07/29/20 2331 07/30/20 0748  BP: 127/68 127/65  Pulse: 72 87  Resp: 16 16  Temp: 99.3 F (37.4 C) 99.1 F (37.3 C)  SpO2: 100% 96%   Vitals:   07/29/20 1606 07/29/20 2331 07/30/20 0500 07/30/20 0748  BP: (!) 144/63 127/68  127/65  Pulse: 71 72  87  Resp: 18 16  16   Temp: 99.1 F (37.3 C) 99.3 F (37.4 C)  99.1 F (37.3 C)  TempSrc: Oral Oral  Oral  SpO2: 100% 100%  96%  Weight:   91.2 kg   Height:        General: Pt is alert, awake, not in acute distress Cardiovascular: RRR, S1/S2 +, no rubs, no gallops Respiratory: CTA bilaterally, no wheezing, no rhonchi Abdominal: Soft, NT, ND, bowel sounds + Extremities: no edema, no cyanosis   The results of significant diagnostics from this hospitalization (including imaging, microbiology, ancillary and laboratory) are listed below for reference.    Microbiology: Recent Results (from the past 240 hour(s))  Urine Culture     Status: Abnormal   Collection Time: 07/28/20 11:24 AM   Specimen: Urine, Random  Result Value Ref Range Status   Specimen Description   Final    URINE, RANDOM Performed at [facility], [address] [place], [place]    Special Requests   Final    NONE Performed at [facility], [address] [place], [place]  [place]    Culture >=100,000 COLONIES/mL PSEUDOMONAS AERUGINOSA (A)  Final   Report Status 07/30/2020 FINAL  Final   Organism ID,  Bacteria PSEUDOMONAS AERUGINOSA (A)  Final      Susceptibility   Pseudomonas aeruginosa - MIC*    CEFTAZIDIME 8 SENSITIVE Sensitive     CIPROFLOXACIN <=0.25 SENSITIVE Sensitive     GENTAMICIN 2 SENSITIVE Sensitive     IMIPENEM 2 SENSITIVE Sensitive     * >=100,000 COLONIES/mL PSEUDOMONAS AERUGINOSA  SARS Coronavirus 2 by RT PCR (hospital order, performed in [facility] hospital lab) Nasopharyngeal Nasopharyngeal Swab     Status: None   Collection Time: 07/29/20  2:57 PM   Specimen: Nasopharyngeal Swab  Result Value Ref Range Status   SARS Coronavirus 2 NEGATIVE NEGATIVE Final    Comment: (NOTE) SARS-CoV-2 target nucleic acids are NOT DETECTED.  The SARS-CoV-2 RNA is generally detectable in upper and lower respiratory specimens during the acute phase of infection. The lowest concentration of SARS-CoV-2 viral copies this assay can detect is 250 copies / mL. A negative result does not preclude SARS-CoV-2 infection and should not be used as the sole basis for treatment or other patient management decisions.  A negative result may occur with improper specimen collection / handling, submission of specimen other than nasopharyngeal swab, presence of viral mutation(s) within the areas targeted by this assay, and inadequate number of viral copies (<250 copies / mL). A negative result must be combined with clinical observations, patient history, and epidemiological information.  Fact Sheet for Patients:   BoilerBrush.com.cy  Fact Sheet for Healthcare Providers: https://pope.com/  This test is not yet approved or  cleared by the [place] FDA and has been authorized for detection and/or diagnosis of SARS-CoV-2 by FDA under an Emergency Use Authorization (EUA).  This EUA will remain in effect (meaning this test  can be used) for the duration of the COVID-19 declaration under Section 564(b)(1) of the Act, 21 U.S.C. section 360bbb-3(b)(1), unless the authorization is terminated or revoked sooner.  Performed at [facility], [address] [place], [place] [address]      Labs: BNP (last 3 results) No results for input(s): BNP in the last 8760 hours. Basic Metabolic Panel: Recent Labs  Lab 07/24/20 0647 07/24/20 0647 07/25/20 0522 07/26/20 0454 07/27/20 0444 07/28/20 1056 07/29/20 0233  NA 148*   < > 144 146* 146* 140 139  K 3.4*   < > 4.3 4.1 4.3 4.2 3.6  CL 110   < > 109 108 107 101 101  CO2 32   < > 32 33* 33* 30 32  GLUCOSE 119*   < > 122* 102* 103* 93 107*  BUN 21   < > 18 22 26* 20 19  CREATININE 0.93   < > 0.93 0.84 0.97 0.85 0.82  CALCIUM 8.7*   < > 8.7* 8.5* 8.7* 8.8* 8.8*  MG 2.1  --  1.9 1.7  --   --  1.7  PHOS 3.2  --  2.8  --   --   --  2.5   < > = values in this interval not displayed.   Liver Function Tests: No results for input(s): AST, ALT, ALKPHOS, BILITOT, PROT, ALBUMIN in the last 168 hours. No results for input(s): LIPASE, AMYLASE in the last 168 hours. No results for input(s): AMMONIA in the last 168 hours. CBC: Recent Labs  Lab 07/27/20 0444 07/29/20 0233  WBC 11.1* 12.1*  HGB 9.7* 9.9*  HCT 29.9* 29.1*  MCV 102.0* 97.0  PLT 233 251   Cardiac Enzymes: No results for input(s): CKTOTAL, CKMB, CKMBINDEX, TROPONINI in the last 168  hours. BNP: Invalid input(s): POCBNP CBG: Recent Labs  Lab 07/29/20 2032 07/29/20 2345 07/30/20 0355 07/30/20 0748 07/30/20 1131  GLUCAP 117* 132* 125* 102* 92   D-Dimer No results for input(s): DDIMER in the last 72 hours. Hgb A1c No results for input(s): HGBA1C in the last 72 hours. Lipid Profile No results for input(s): CHOL, HDL, LDLCALC, TRIG, CHOLHDL, LDLDIRECT in the last 72 hours. Thyroid function studies No results for input(s): TSH, T4TOTAL, T3FREE, THYROIDAB in the last 72 hours.  Invalid  input(s): FREET3 Anemia work up No results for input(s): VITAMINB12, FOLATE, FERRITIN, TIBC, IRON, RETICCTPCT in the last 72 hours. Urinalysis    Component Value Date/Time   COLORURINE YELLOW (A) 07/28/2020 1124   APPEARANCEUR HAZY (A) 07/28/2020 1124   LABSPEC 1.016 07/28/2020 1124   PHURINE 7.0 07/28/2020 1124   GLUCOSEU NEGATIVE 07/28/2020 1124   HGBUR NEGATIVE 07/28/2020 1124   BILIRUBINUR NEGATIVE 07/28/2020 1124   KETONESUR NEGATIVE 07/28/2020 1124   PROTEINUR NEGATIVE 07/28/2020 1124   NITRITE POSITIVE (A) 07/28/2020 1124   LEUKOCYTESUR NEGATIVE 07/28/2020 1124   Sepsis Labs Invalid input(s): PROCALCITONIN,  WBC,  LACTICIDVEN Microbiology Recent Results (from the past 240 hour(s))  Urine Culture     Status: Abnormal   Collection Time: 07/28/20 11:24 AM   Specimen: Urine, Random  Result Value Ref Range Status   Specimen Description   Final    URINE, RANDOM Performed at [facility], [address] [place], [place] [address]    Special Requests   Final    NONE Performed at [facility], [address] [place], [place] [address]    Culture >=100,000 COLONIES/mL PSEUDOMONAS AERUGINOSA (A)  Final   Report Status 07/30/2020 FINAL  Final   Organism ID, Bacteria PSEUDOMONAS AERUGINOSA (A)  Final      Susceptibility   Pseudomonas aeruginosa - MIC*    CEFTAZIDIME 8 SENSITIVE Sensitive     CIPROFLOXACIN <=0.25 SENSITIVE Sensitive     GENTAMICIN 2 SENSITIVE Sensitive     IMIPENEM 2 SENSITIVE Sensitive     * >=100,000 COLONIES/mL PSEUDOMONAS AERUGINOSA  SARS Coronavirus 2 by RT PCR (hospital order, performed in [facility] hospital lab) Nasopharyngeal Nasopharyngeal Swab     Status: None   Collection Time: 07/29/20  2:57 PM   Specimen: Nasopharyngeal Swab  Result Value Ref Range Status   SARS Coronavirus 2 NEGATIVE NEGATIVE Final    Comment: (NOTE) SARS-CoV-2 target nucleic acids are NOT DETECTED.  The SARS-CoV-2 RNA is generally detectable in upper  and lower respiratory specimens during the acute phase of infection. The lowest concentration of SARS-CoV-2 viral copies this assay can detect is 250 copies / mL. A negative result does not preclude SARS-CoV-2 infection and should not be used as the sole basis for treatment or other patient management decisions.  A negative result may occur with improper specimen collection / handling, submission of specimen other than nasopharyngeal swab, presence of viral mutation(s) within the areas targeted by this assay, and inadequate number of viral copies (<250 copies / mL). A negative result must be combined with clinical observations, patient history, and epidemiological information.  Fact Sheet for Patients:   BoilerBrush.com.cy  Fact Sheet for Healthcare Providers: https://pope.com/  This test is not yet approved or  cleared by the [place] FDA and has been authorized for detection and/or diagnosis of SARS-CoV-2 by FDA under an Emergency Use Authorization (EUA).  This EUA will remain in effect (meaning this test can be used) for the duration  of the COVID-19 declaration under Section 564(b)(1) of the Act, 21 U.S.C. section 360bbb-3(b)(1), unless the authorization is terminated or revoked sooner.  Performed at [facility], [address] [place], [place] [address]     Time coordinating discharge: Over 30 minutes  SIGNED:  Arnetha Courser, MD  [facility] 07/30/2020, 1:03 PM  If 7PM-7AM, please contact night-coverage [email or web address]  This record has been created using Conservation officer, historic buildings. Errors have been sought and corrected,but may not always be located. Such creation errors do not reflect on the standard of care.

## 2020-07-30 NOTE — Plan of Care (Signed)
DISCHARGE NOTE [facility] to be discharged [facility] per MD order. Patient verbalized understanding.  Skin clean, dry. IV catheter discontinued intact. Site without signs and symptoms of complications. Dressing and pressure applied. Pt denies pain at the site currently. No complaints noted.  Patient free of lines, drains.   Discharge packet assembled. An After Visit Summary (AVS) was printed and given to the EMS personnel. Patient escorted via stretcher and discharged to [facility] via ambulance. Report called to accepting facility; all questions and concerns addressed.   Arlice Colt, RN

## 2020-07-30 NOTE — ED Triage Notes (Addendum)
Pt arriving EMS from [facility] for unwitnessed fall. Pt stood up from dinner and fell down. Pt at facility for repetitive falls. Pt sts he remembers the event and that he hit his head on floor. C/o R hip pain, R shoulder, neck and head pain. Denies LOC. EMS applied rigid [facility].,

## 2020-07-30 NOTE — Progress Notes (Signed)
OT Cancellation Note  Patient Details Name: Joseph Owen MRN: [id number] DOB: 19-Nov-1950   Cancelled Treatment:    Reason Eval/Treat Not Completed: Fatigue/lethargy limiting ability to participate. Upon attempt, pt sleeping soundly, difficult to wake with verbal/tactile cues. Will re-attempt OT tx at later time as schedule permits.   Richrd Prime, MPH, MS, OTR/L ascom [phone] 07/30/20, 9:14 AM

## 2020-07-31 NOTE — ED Notes (Signed)
Attempted to call report to [facility]; no answer

## 2020-08-07 LAB — CULTURE, FUNGUS WITHOUT SMEAR

## 2021-11-02 IMAGING — CT CT MAXILLOFACIAL W/O CM
3 series · 15 of 47 positions shown, 18 images · non-contrast
Comparison: May 07, 2018.

CLINICAL DATA: Found down

EXAM:
CT HEAD WITHOUT CONTRAST
CT MAXILLOFACIAL WITHOUT CONTRAST
CT CERVICAL SPINE WITHOUT CONTRAST
TECHNIQUE: Multidetector CT imaging of the head, cervical spine, and
maxillofacial structures were performed using the standard protocol
without intravenous contrast. Multiplanar CT image reconstructions
of the cervical spine and maxillofacial structures were also
generated.

[Series 2: max soft · axial · 0.38mm/px · z∈[-270,-120]mm · 9 of 89 slices shown, 12 images]
[im 7/89  brain]
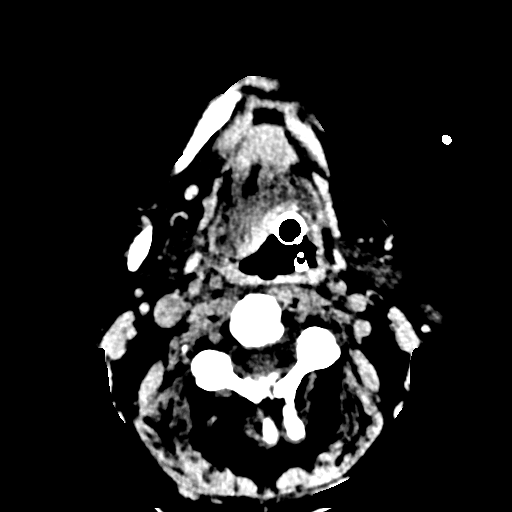
[im 7/89  bone]
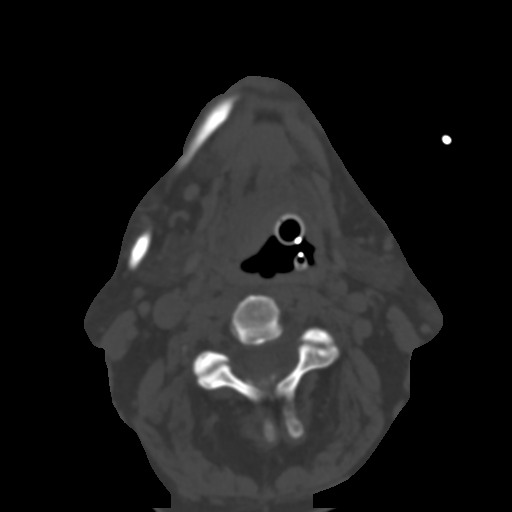
[im 16/89  bone]
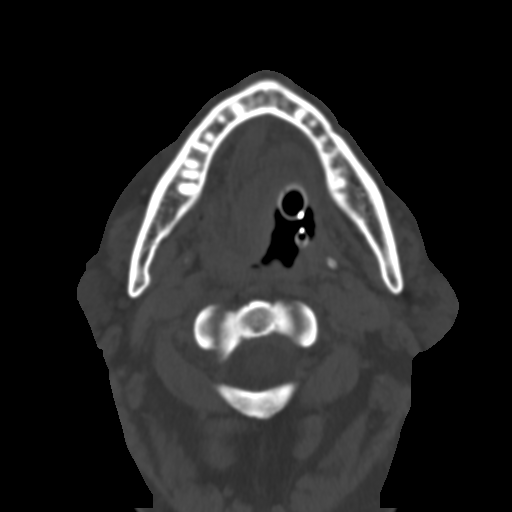
[im 25/89  bone]
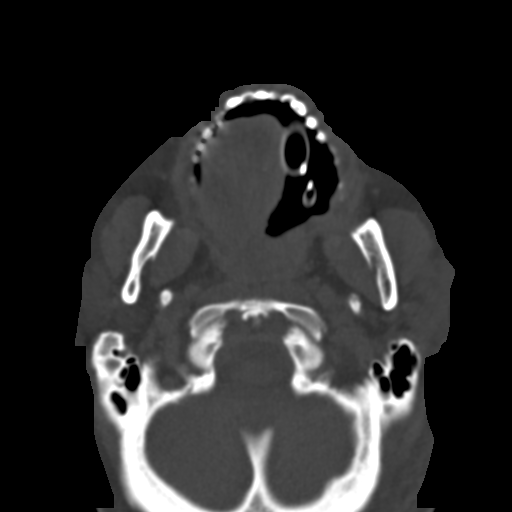
[im 34/89  bone]
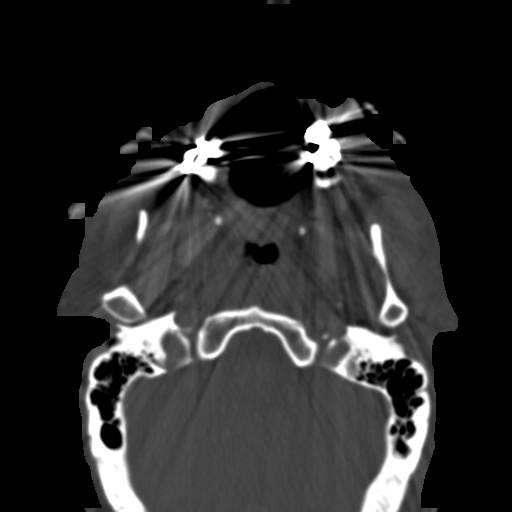
[im 46/89  brain]
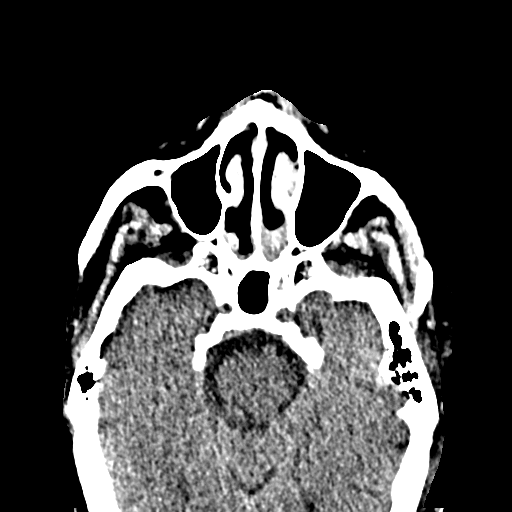
[im 46/89  bone]
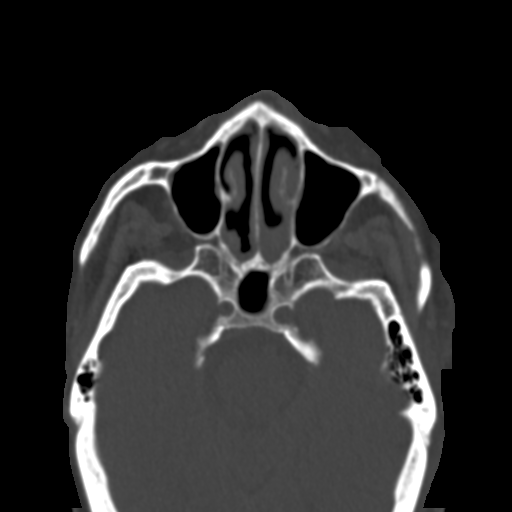
[im 55/89  bone]
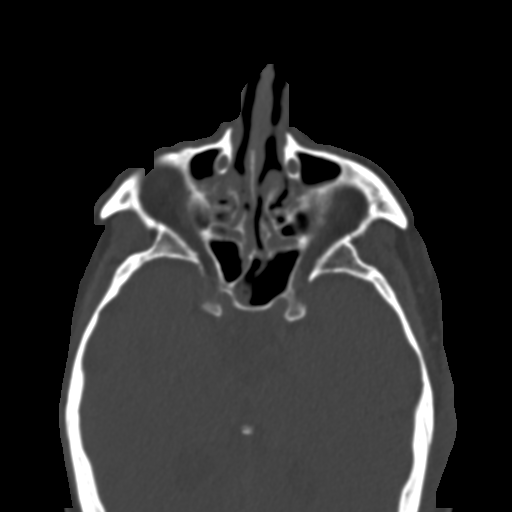
[im 64/89  bone]
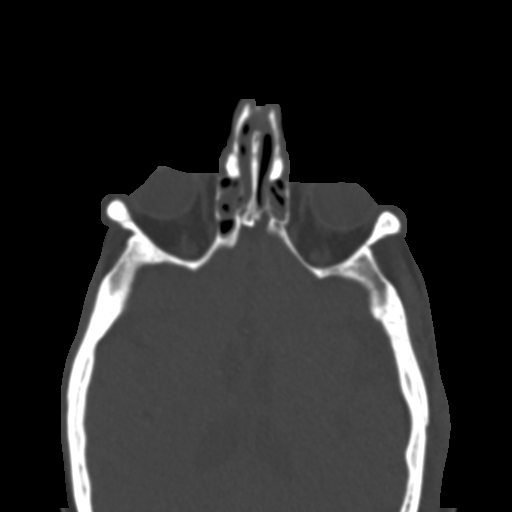
[im 73/89  bone]
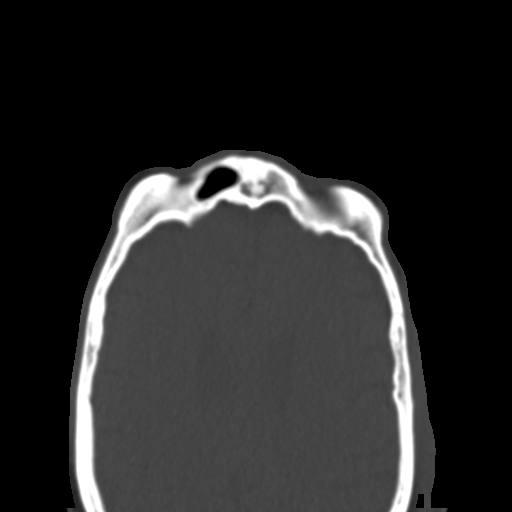
[im 82/89  brain]
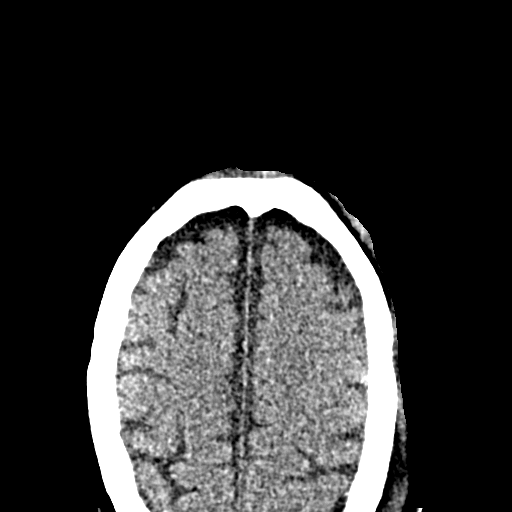
[im 82/89  bone]
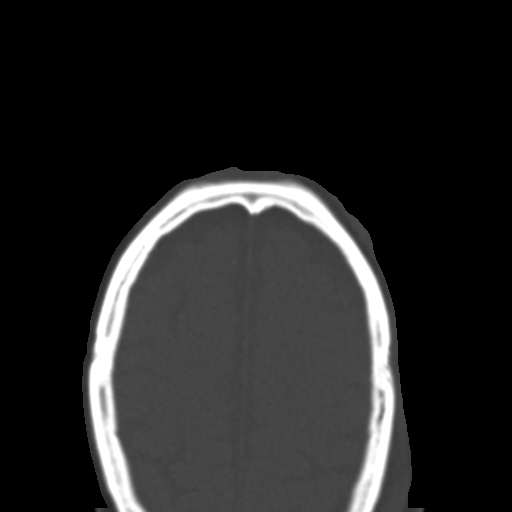

[Series 6: coronal soft · coronal · 0.36mm/px · 3 of 113 slices shown]
[im 38/113  bone]
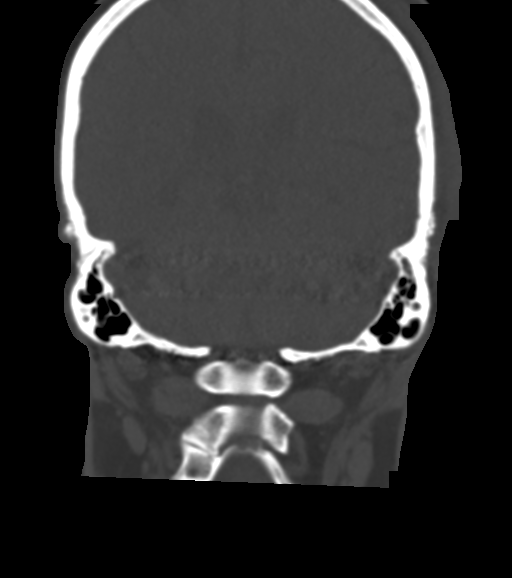
[im 50/113  bone]
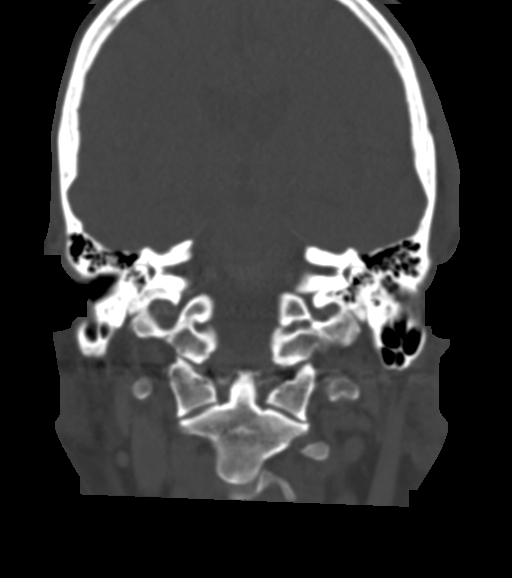
[im 63/113  bone]
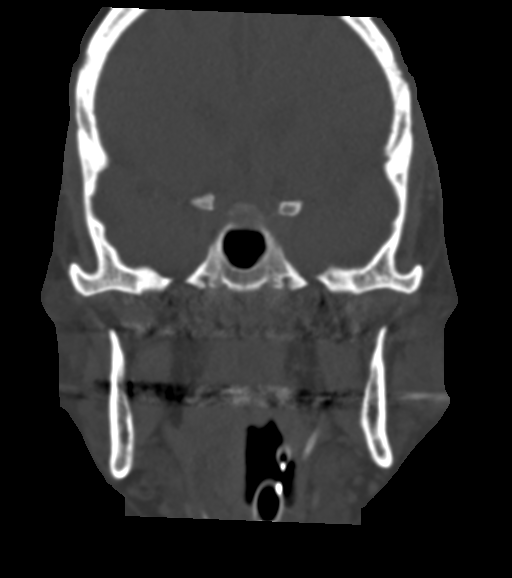

[Series 8: sagittal soft · sagittal · 0.41mm/px · 3 of 88 slices shown]
[im 30/88  bone]
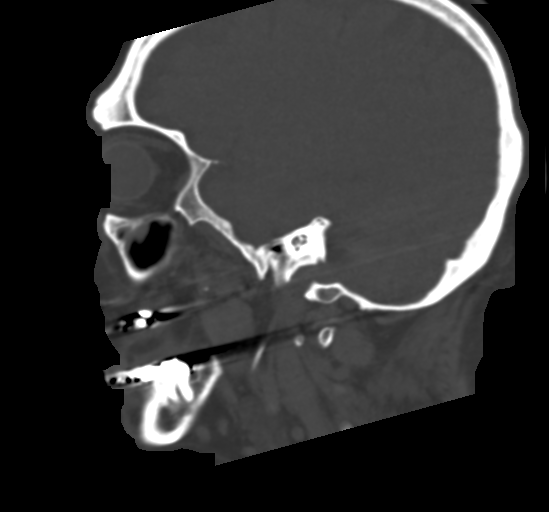
[im 44/88  bone]
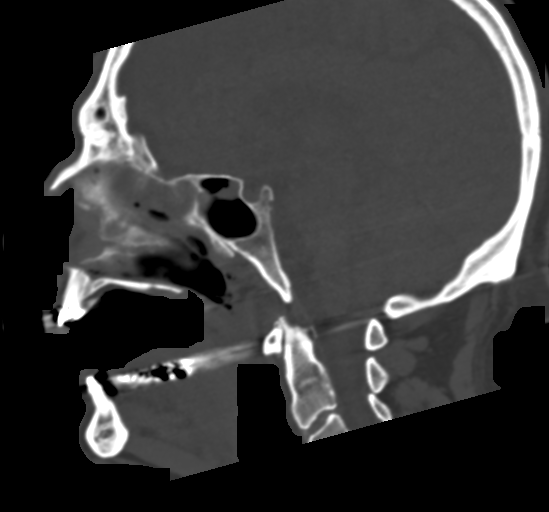
[im 59/88  bone]
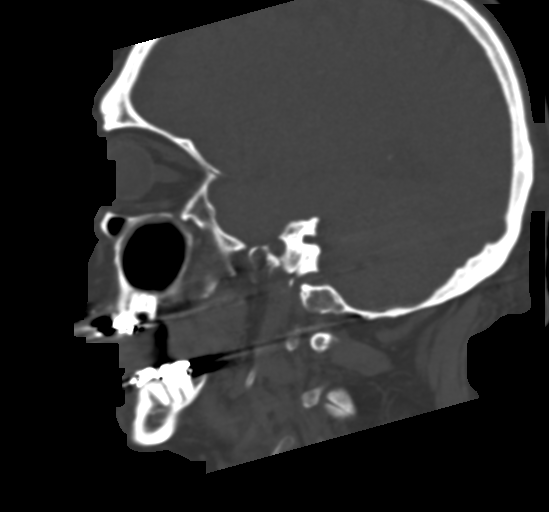

[15 of 47 positions shown; findings below may reference images not displayed]

FINDINGS: CT HEAD FINDINGS

Brain: No evidence of acute infarction, hemorrhage, hydrocephalus,
extra-axial collection or mass lesion/mass effect.

Vascular: No hyperdense vessel or unexpected calcification.

Skull: Normal. Negative for fracture or focal lesion.

Other: None.

CT MAXILLOFACIAL FINDINGS

Osseous: No fracture or mandibular dislocation. No destructive
process.

Orbits: Negative. No traumatic or inflammatory finding.

Sinuses: Mucosal thickening throughout the ethmoid air cells,
bilateral sphenoid sinuses, maxillary sinuses and frontal sinuses.

Soft tissues: Soft tissue edema of the LEFT frontal scalp.

CT CERVICAL SPINE FINDINGS

Alignment: Normal.

Skull base and vertebrae: No acute fracture. No primary bone lesion
or focal pathologic process.

Soft tissues and spinal canal: No prevertebral fluid or swelling. No
visible canal hematoma.

Disc levels: Intervertebral disc space height loss is most
pronounced at C5-6 and C6-7. Posterior disc osteophyte complex at
C6-7 and C5-6 without high-grade canal stenosis. Multilevel
uncovertebral hypertrophy. Multilevel flowing anterior osteophytes.

Upper chest: Remote RIGHT clavicular fracture.

Other: Mild-to-moderate atherosclerotic calcifications of the RIGHT
greater than LEFT carotid bulb. Partial visualization of ETT tube
and enteric tube.
IMPRESSION: 1.  No acute intracranial abnormality.
2. No acute facial bone fracture.
3. No acute fracture or subluxation of the cervical spine.
4. Soft tissue edema of the LEFT frontal scalp.

## 2021-11-02 IMAGING — DX DG CHEST 1V PORT
1 series · 1 of 1 positions shown · non-contrast
Comparison: August 05, 2010

CLINICAL DATA: Hypoxia

EXAM:
PORTABLE CHEST 1 VIEW

[chest ap]
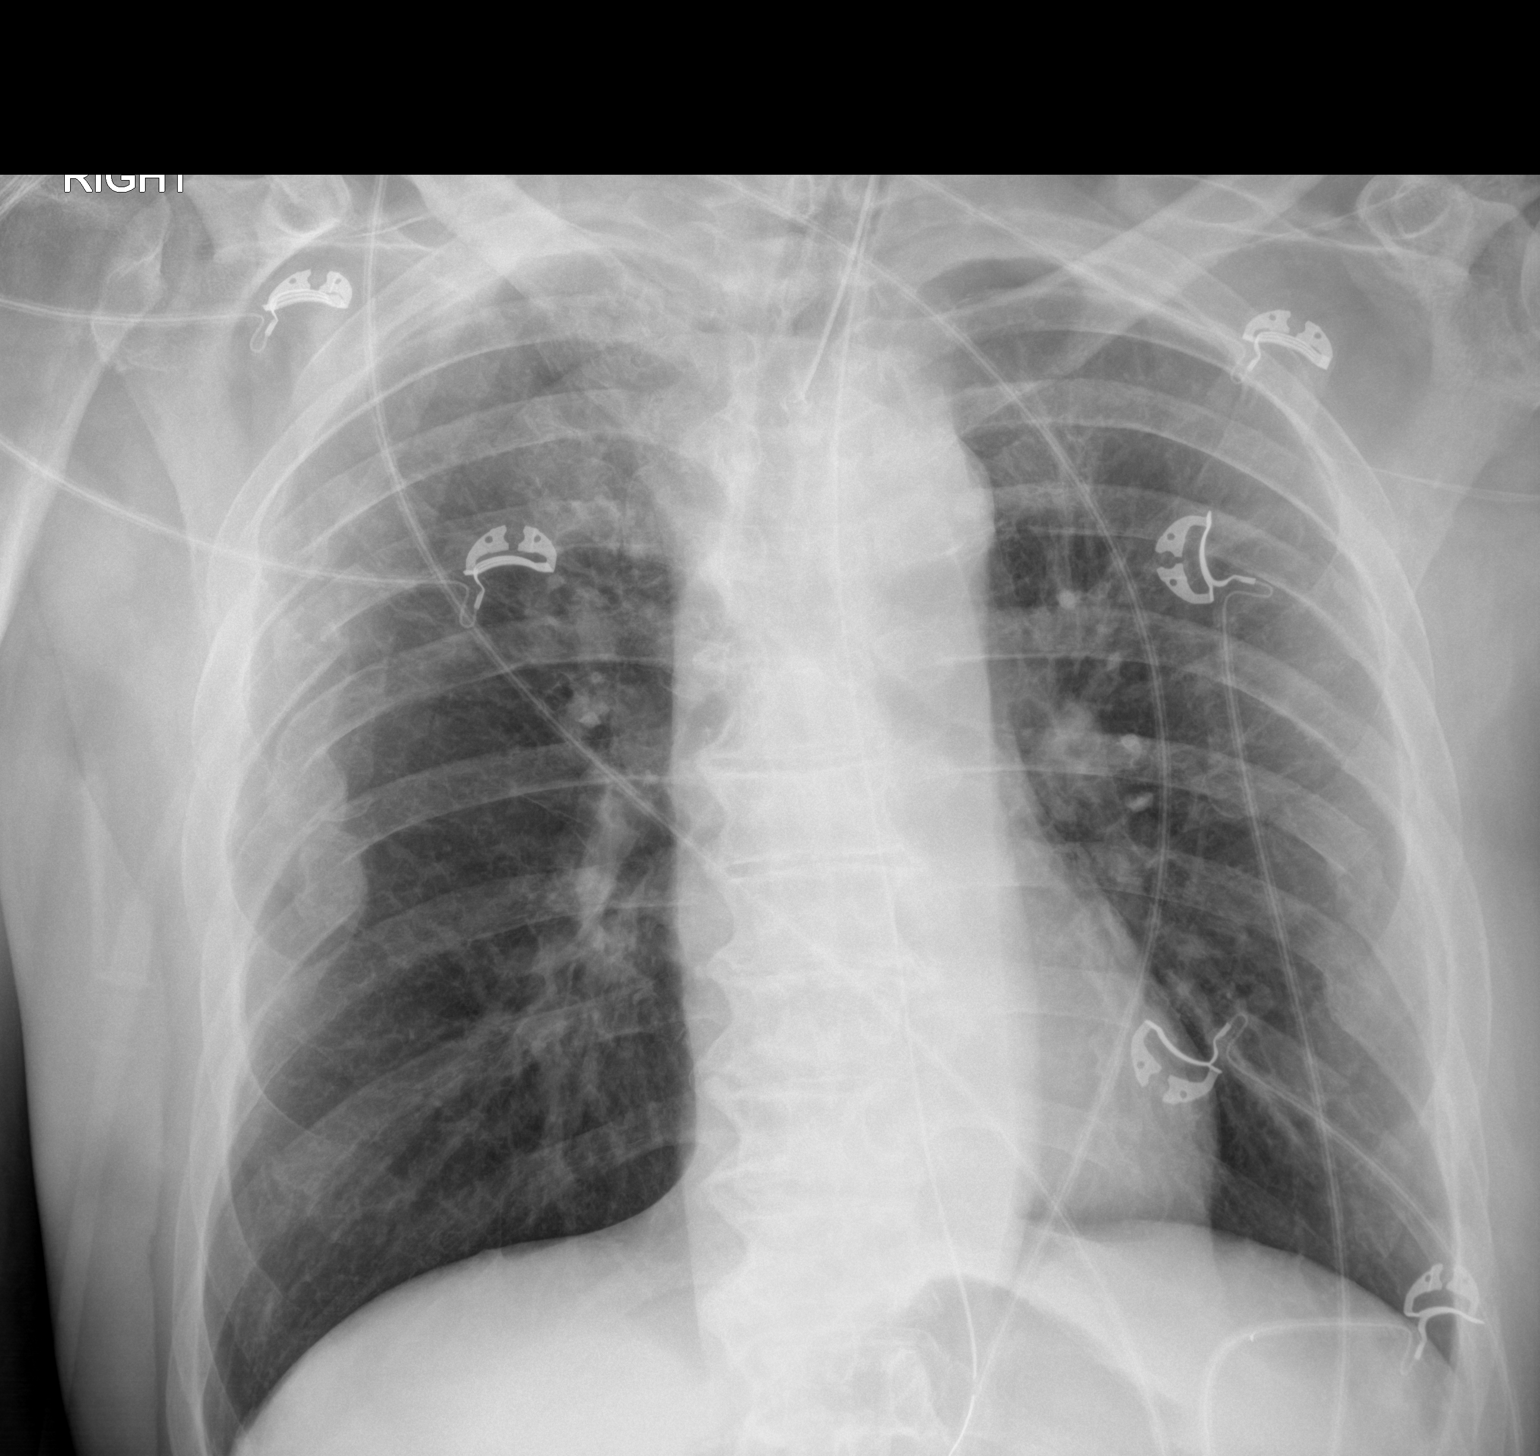

[1 of 1 positions shown; findings below may reference images not displayed]

FINDINGS: Endotracheal tube tip is 5.6 cm above the carina. Nasogastric tube
tip is below the diaphragm. No pneumothorax. No edema or airspace
opacity. Heart size and pulmonary vascularity are normal. No
adenopathy. Old healed rib fractures noted on each side with
remodeling.
IMPRESSION: Endotracheal tube and nasogastric tube as described. No
pneumothorax. Lungs clear. Cardiac silhouette within normal limits.
